# Patient Record
Sex: Female | Born: 1955
Health system: Southern US, Community
[De-identification: ages and names within clinical notes are randomized; demographics above are authoritative.]

## PROBLEM LIST (undated history)

## (undated) ENCOUNTER — Emergency Department: Discharge status: Absent without leave | Payer: Medicare HMO

## (undated) DIAGNOSIS — M25519 Pain in unspecified shoulder: Secondary | ICD-10-CM

## (undated) DIAGNOSIS — M549 Dorsalgia, unspecified: Secondary | ICD-10-CM

## (undated) HISTORY — PX: ABDOMINAL HYSTERECTOMY: SHX81

## (undated) HISTORY — DX: Pain in unspecified shoulder: M25.519

## (undated) HISTORY — DX: Dorsalgia, unspecified: M54.9

---

## 2005-07-17 ENCOUNTER — Other Ambulatory Visit: Payer: Self-pay

## 2005-07-17 ENCOUNTER — Emergency Department: Payer: Self-pay | Admitting: Emergency Medicine

## 2005-07-21 ENCOUNTER — Ambulatory Visit: Payer: Self-pay

## 2006-01-28 ENCOUNTER — Ambulatory Visit: Payer: Self-pay | Admitting: Gastroenterology

## 2008-04-30 ENCOUNTER — Emergency Department: Payer: Self-pay | Admitting: Emergency Medicine

## 2008-05-08 ENCOUNTER — Emergency Department: Payer: Self-pay | Admitting: Emergency Medicine

## 2008-05-08 ENCOUNTER — Other Ambulatory Visit: Payer: Self-pay

## 2009-03-01 ENCOUNTER — Encounter
Admission: RE | Admit: 2009-03-01 | Discharge: 2009-03-01 | Payer: Self-pay | Admitting: Physical Medicine and Rehabilitation

## 2010-07-27 ENCOUNTER — Ambulatory Visit: Payer: Self-pay | Admitting: Family Medicine

## 2012-07-10 ENCOUNTER — Emergency Department: Payer: Self-pay | Admitting: Emergency Medicine

## 2012-07-10 LAB — COMPREHENSIVE METABOLIC PANEL
Albumin: 4.1 g/dL (ref 3.4–5.0)
Alkaline Phosphatase: 59 U/L (ref 50–136)
Calcium, Total: 9.1 mg/dL (ref 8.5–10.1)
Co2: 28 mmol/L (ref 21–32)
Creatinine: 0.79 mg/dL (ref 0.60–1.30)
EGFR (Non-African Amer.): >60
Glucose: 142 mg/dL — ABNORMAL HIGH (ref 65–99)
Osmolality: 283 (ref 275–301)
SGPT (ALT): 25 U/L
Sodium: 142 mmol/L (ref 136–145)

## 2012-07-10 LAB — URINALYSIS, COMPLETE
Bilirubin,UR: NEGATIVE
Blood: NEGATIVE
Ketone: NEGATIVE
Ph: 5 (ref 4.5–8.0)
Protein: NEGATIVE
Specific Gravity: 1.003 (ref 1.003–1.030)
Transitional Epi: <1

## 2012-07-10 LAB — CBC
HCT: 36.5 % (ref 35.0–47.0)
MCHC: 33.6 g/dL (ref 32.0–36.0)
MCV: 99 fL (ref 80–100)
RDW: 12.2 % (ref 11.5–14.5)
WBC: 4.6 10*3/uL (ref 3.6–11.0)

## 2012-07-13 ENCOUNTER — Emergency Department: Payer: Self-pay | Admitting: *Deleted

## 2012-07-13 LAB — URINALYSIS, COMPLETE
Bilirubin,UR: NEGATIVE
Blood: NEGATIVE
Glucose,UR: NEGATIVE mg/dL (ref 0–75)
Ketone: NEGATIVE
Nitrite: NEGATIVE
Ph: 7 (ref 4.5–8.0)
Protein: NEGATIVE
RBC,UR: 1 /HPF (ref 0–5)
Specific Gravity: 1.005 (ref 1.003–1.030)
WBC UR: 4 /HPF (ref 0–5)

## 2012-07-13 LAB — COMPREHENSIVE METABOLIC PANEL
BUN: 6 mg/dL — ABNORMAL LOW (ref 7–18)
Bilirubin,Total: 0.3 mg/dL (ref 0.2–1.0)
Creatinine: 0.67 mg/dL (ref 0.60–1.30)
Glucose: 98 mg/dL (ref 65–99)
Osmolality: 281 (ref 275–301)
Potassium: 3.8 mmol/L (ref 3.5–5.1)
Sodium: 142 mmol/L (ref 136–145)
Total Protein: 7.7 g/dL (ref 6.4–8.2)

## 2012-07-13 LAB — CBC
MCH: 33.1 pg (ref 26.0–34.0)
MCHC: 33.6 g/dL (ref 32.0–36.0)
MCV: 99 fL (ref 80–100)

## 2013-02-12 ENCOUNTER — Ambulatory Visit: Payer: Self-pay | Admitting: Family Medicine

## 2014-03-02 LAB — CBC
HCT: 35 % (ref 35.0–47.0)
HGB: 11.6 g/dL — ABNORMAL LOW (ref 12.0–16.0)
MCH: 31.9 pg (ref 26.0–34.0)
MCHC: 33 g/dL (ref 32.0–36.0)
MCV: 97 fL (ref 80–100)
Platelet: 261 10*3/uL (ref 150–440)
RBC: 3.63 10*6/uL — ABNORMAL LOW (ref 3.80–5.20)
RDW: 13.1 % (ref 11.5–14.5)
WBC: 6.7 10*3/uL (ref 3.6–11.0)

## 2014-03-02 LAB — URINALYSIS, COMPLETE
BACTERIA: NONE SEEN
BLOOD: NEGATIVE
Bilirubin,UR: NEGATIVE
Glucose,UR: NEGATIVE mg/dL (ref 0–75)
Ketone: NEGATIVE
Nitrite: NEGATIVE
PH: 6 (ref 4.5–8.0)
Protein: NEGATIVE
Specific Gravity: 1.019 (ref 1.003–1.030)
Squamous Epithelial: 1
WBC UR: 7 /HPF (ref 0–5)

## 2014-03-02 LAB — TROPONIN I: Troponin-I: <0.02 ng/mL

## 2014-03-02 LAB — DRUG SCREEN, URINE
AMPHETAMINES, UR SCREEN: NEGATIVE (ref ?–1000)
BARBITURATES, UR SCREEN: NEGATIVE (ref ?–200)
Benzodiazepine, Ur Scrn: POSITIVE (ref ?–200)
CANNABINOID 50 NG, UR ~~LOC~~: NEGATIVE (ref ?–50)
COCAINE METABOLITE, UR ~~LOC~~: NEGATIVE (ref ?–300)
MDMA (ECSTASY) UR SCREEN: NEGATIVE (ref ?–500)
METHADONE, UR SCREEN: NEGATIVE (ref ?–300)
Opiate, Ur Screen: POSITIVE (ref ?–300)
Phencyclidine (PCP) Ur S: NEGATIVE (ref ?–25)
TRICYCLIC, UR SCREEN: POSITIVE (ref ?–1000)

## 2014-03-02 LAB — BASIC METABOLIC PANEL
Anion Gap: 6 — ABNORMAL LOW (ref 7–16)
BUN: 16 mg/dL (ref 7–18)
CALCIUM: 9.1 mg/dL (ref 8.5–10.1)
CHLORIDE: 105 mmol/L (ref 98–107)
CREATININE: 0.93 mg/dL (ref 0.60–1.30)
Co2: 28 mmol/L (ref 21–32)
Glucose: 116 mg/dL — ABNORMAL HIGH (ref 65–99)
Osmolality: 280 (ref 275–301)
Potassium: 3.2 mmol/L — ABNORMAL LOW (ref 3.5–5.1)
Sodium: 139 mmol/L (ref 136–145)

## 2014-03-02 LAB — SALICYLATE LEVEL

## 2014-03-02 LAB — ACETAMINOPHEN LEVEL: Acetaminophen: <2 ug/mL

## 2014-03-02 LAB — TSH: Thyroid Stimulating Horm: 0.24 u[IU]/mL — ABNORMAL LOW

## 2014-03-02 LAB — T4, FREE: Free Thyroxine: 0.99 ng/dL (ref 0.76–1.46)

## 2014-03-02 LAB — ETHANOL: Ethanol: <3 mg/dL

## 2014-03-04 LAB — COMPREHENSIVE METABOLIC PANEL
ALBUMIN: 4.3 g/dL (ref 3.4–5.0)
ALK PHOS: 72 U/L
ALT: 28 U/L (ref 12–78)
ANION GAP: 10 (ref 7–16)
AST: 19 U/L (ref 15–37)
BUN: 14 mg/dL (ref 7–18)
Bilirubin,Total: 0.4 mg/dL (ref 0.2–1.0)
CALCIUM: 9.6 mg/dL (ref 8.5–10.1)
Chloride: 105 mmol/L (ref 98–107)
Co2: 25 mmol/L (ref 21–32)
Creatinine: 0.78 mg/dL (ref 0.60–1.30)
EGFR (African American): >60
Glucose: 147 mg/dL — ABNORMAL HIGH (ref 65–99)
OSMOLALITY: 283 (ref 275–301)
POTASSIUM: 3.7 mmol/L (ref 3.5–5.1)
SODIUM: 140 mmol/L (ref 136–145)
Total Protein: 8.4 g/dL — ABNORMAL HIGH (ref 6.4–8.2)

## 2014-03-04 LAB — CBC
HCT: 41.7 % (ref 35.0–47.0)
HGB: 14.2 g/dL (ref 12.0–16.0)
MCH: 32.6 pg (ref 26.0–34.0)
MCHC: 34 g/dL (ref 32.0–36.0)
MCV: 96 fL (ref 80–100)
PLATELETS: 321 10*3/uL (ref 150–440)
RBC: 4.36 10*6/uL (ref 3.80–5.20)
RDW: 13.2 % (ref 11.5–14.5)
WBC: 14.5 10*3/uL — AB (ref 3.6–11.0)

## 2014-03-04 LAB — ACETAMINOPHEN LEVEL: Acetaminophen: <2 ug/mL

## 2014-03-04 LAB — CK TOTAL AND CKMB (NOT AT ARMC)
CK, Total: 68 U/L
CK-MB: 1.6 ng/mL (ref 0.5–3.6)

## 2014-03-04 LAB — SALICYLATE LEVEL: Salicylates, Serum: 1.9 mg/dL

## 2014-03-04 LAB — TROPONIN I: Troponin-I: <0.02 ng/mL

## 2014-03-04 LAB — LIPASE, BLOOD: Lipase: 94 U/L (ref 73–393)

## 2014-03-05 ENCOUNTER — Inpatient Hospital Stay: Payer: Self-pay | Admitting: Psychiatry

## 2014-03-22 DIAGNOSIS — M5134 Other intervertebral disc degeneration, thoracic region: Secondary | ICD-10-CM | POA: Insufficient documentation

## 2014-04-04 DIAGNOSIS — R419 Unspecified symptoms and signs involving cognitive functions and awareness: Secondary | ICD-10-CM | POA: Insufficient documentation

## 2015-04-12 NOTE — Consult Note (Signed)
PATIENT NAME:  Janet Murphy, Yareth F MR#:  [id number] DATE OF BIRTH:  April 15, 1956  DATE OF CONSULTATION:  03/03/2014    CONSULTING PHYSICIAN:  Insiya Oshea K. Guss Bundehalla, MD  PLACE OF DICTATION: [facility] Emergency Room - BHU 3, [place], [place].  AGE: 59 years. SEX: Female.  RACE: White.  SUBJECTIVE: The patient was seen in consultation in room BHU-3 in the Emergency Room at [facility].  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 The patient is married for 27 years and lives with her husband who is 60 years old, and has stage 2 of COPD. The patient is brought to the Emergency Room after she had a panic attack. The patient reports that she has degenerative disk disease with chronic pain for which she is being followed at pain management clinic [place], [place], by Dr. Regenia Skeeterogra, and in addition she is being followed by Dr. Lacie ScottsNiemeyer, who is primary care physician. The patient reports that recently pain management clinic doctors reduced her from Tylenol with hydrocodone to hydrocodone alone, and she has been demanding Tylenol back and they would not give it.  Her pain has got worse. In addition, she has been taking Xanax 1 mg 4 times a day from Dr. Carron BrazenNiemeyer's office. The patient reports that she has had 2 panic attacks because of her husband's bad health and having stage 2 COPD. She was brought to the Emergency Room for the same.   OBJECTIVE: The patient was seen laying in bed wearing hospital clothes, alert, but really confused. She said this was some behavioral health place and they merged with [facility], and she could not name the new hospital. She said this was March and did not know the date.  She asked "what is the date". She stated "this is a nice place with a good fellow like him," and she pointed to the officer.  She said this was 2016, really confused and she is not able to focus. Memory and recall are poor. Denies hearing voices,  seeing things. Denies paranoid suspiciousness.  Denies any ideas or plans to hurt herself or others, and repeatedly stated that she came for a panic/anxiety attack. Insight and judgement guarded versus impaired with confusion at this time.   IMPRESSION:  1.  Cognitive disorder secondary to Xanax abuse-dependence to be ruled out.  2.  Mood disorder secondary to chronic pain in the back.   PLAN: Observe the patient.  Probably she is withdrawing from Xanax overdose.  We will continue to observe her at this time and do p.r.n. medications only as needed. Tomorrow, that is Monday, 03/04/2014, Mr. Casandra DoffingKen Smith is to call her pain management doctor and Dr. Carron BrazenNiemeyer's office to find out further details about her, along with her husband, who may know better than her about what was going on, and then plan accordingly and plan for discharge.  ____________________________ Jannet MantisSurya K. Guss Bundehalla, MD skc:sg D: 03/03/2014 18:19:20 ET T: 03/04/2014 06:59:13 ET JOB#: [id number]  cc: Monika SalkSurya K. Guss Bundehalla, MD, <Dictator> Beau FannySURYA K Hannan Tetzlaff MD ELECTRONICALLY SIGNED 03/05/2014 20:40

## 2015-04-12 NOTE — H&P (Signed)
PATIENT NAME:  Janet Murphy, Janet Murphy MR#:  [id number] DATE OF BIRTH:  10/25/1956  DATE OF ADMISSION:  03/05/2014  REFERRING PHYSICIAN: Emergency Room MD.   ATTENDING PHYSICIAN: Jolanta B. Jennet MaduroPucilowska, MD  IDENTIFYING DATA: Janet Murphy is a 59 year old female with history of depression and anxiety.   CHIEF COMPLAINT: "I did not get my Prozac."   HISTORY OF PRESENT ILLNESS: Janet Murphy is rather disorganized and difficult to interview. She is a poor historian. She reports that on the day of admission to the Emergency Room, which was on March 14th, she had a panic attack. She felt that her husband was unkind, rushing her to cook supper while he was watching a basketball game. She became rather upset, and in order to handle her mean husband, she took more than usually of hydrocodone and Xanax. She took 25 mg of hydrocodone and 2 mg of Xanax. She is taking 20 mg of hydrocodone 3 to 4 times a day usually, and Xanax 4 times a day. She does not believe that she overdosed on her medication. She adamantly denies that this was a suicide attempt and explains in great excruciating detail that she is [place] and would never do such thing. She is meant to help people and take care of everybody. The husband felt that the wife overdosed on medications and brought her to the Emergency Room. In the Emergency Room, she had several episodes of symptoms that were physical in nature and was discovered to be short of breath, had UTI and severe diarrhea. She, for several days, was taken care of by Emergency Room physicians. When she was medically cleared, she was admitted to psychiatry by Dr. Garnetta BuddyFaheem. The patient denies any symptoms of depression, anxiety or psychosis. She denies suicidal thoughts or a suicide attempt. She denies misuse of her prescription pills. She does not drink or use illicit drugs. She denies ever having an overdose or an episode when she was not in control of her behavior. Her husband has been calling  Emergency Room staff repeatedly, complaining about his wife behaving strangely. When I call him, he did not wish to talk to me and preferred to speak with someone who can speak [place]. My social worker is not available at the moment until later this afternoon. Therefore, I am unable to obtain any information from the husband. On the unit, the patient presents as hyperactive, hyperverbal, disorganized, intrusive, confused, unable to remember anything. She repeatedly asked for Allegra, repeatedly asked for Prozac, which she was given. Has no boundaries and has emotional incontinence. She is difficult to redirect. She insists on getting her Prozac. She never mentioned pain, even though she has not been given any of her narcotics. She sees Dr. Lacie ScottsNiemeyer on a regular basis, who directed her to a pain clinic in [place], where Dr. Regenia Skeeterogra prescribes hydrocodone. Her dose was recently increased from 10 to 20 mg 3 to 4 times a day as needed for pain. Dr. Lacie ScottsNiemeyer prescribed Xanax 1 mg 4 times daily, but recommended that the patient see a psychiatrist at [facility] in [place]. The patient did not make appointment yet.   PAST PSYCHIATRIC HISTORY: She has never seen a psychiatrist. She has been taking Prozac 20 mg for depression and Xanax 4 mg for anxiety. She reports having 2 panic attacks in her life, the first one a year ago in her sleep and another one on the day of admission to Emergency Room. She never attempted suicide. She never had any problems with substances or  prescription pills.   FAMILY PSYCHIATRIC HISTORY: None reported.   PAST MEDICAL HISTORY: Chronic pain from osteoarthritis, COPD.   ALLERGIES: No known drug allergies.   MEDICATIONS ON ADMISSION: I do not believe that this is a full or good list. Celebrex 200 mg twice daily, Hysingla ER 20 mg at 4:00 p.m., Prozac 20 mg daily, trazodone 50 mg at bedtime, Xanax 1 mg 4 times daily, ProAir as needed for shortness of breath, methocarbamol 500 mg  3 times daily, omega-3 fatty acids 1000 mg daily, Myrbetriq 25 mg daily, calcium daily.   SOCIAL HISTORY: She is married. She has been on and off with her husband for 16 years. She lives with her 30 year old son who was just saved in 2003. She works full time as a Chartered certified accountant, Furniture conservator/restorer." She is involved in her church and tries to help people around her as much as she can.   REVIEW OF SYSTEMS:  CONSTITUTIONAL: No fevers or chills. No weight changes.  EYES: No double or blurred vision.  ENT: No hearing loss.  RESPIRATORY: No shortness of breath or cough.  CARDIOVASCULAR: No chest pain or orthopnea.  GASTROINTESTINAL: No abdominal pain, nausea, vomiting or diarrhea.  GENITOURINARY: No incontinence or frequency.  ENDOCRINE: No heat or cold intolerance.  LYMPHATIC: No anemia or easy bruising.  INTEGUMENTARY: No acne or rash.  MUSCULOSKELETAL: Positive for back pain radiating to her left foot.  NEUROLOGIC: No tingling or weakness.  PSYCHIATRIC: See history of present illness for details.   PHYSICAL EXAMINATION:  VITAL SIGNS: Blood pressure 105/74, pulse 77, respirations 20, temperature 98.3.  GENERAL: This is a slender middle-aged female in no acute distress.  HEENT: The pupils are equal, round and reactive to light. Sclerae anicteric.  NECK: Supple. No thyromegaly.  LUNGS: Clear to auscultation. No dullness to percussion.  HEART: Regular rhythm and rate. No murmurs, rubs or gallops.  ABDOMEN: Soft, nontender, nondistended. Positive bowel sounds.  MUSCULOSKELETAL: Normal muscle strength in all extremities.  SKIN: No rashes or bruises.  LYMPHATIC: No cervical adenopathy.  NEUROLOGIC: Cranial nerves II through XII are intact.   LABORATORY DATA: Chemistries are within normal limits. Blood alcohol level 0. LFTs within normal limits. Cardiac enzymes negative. TSH 0.24 with normal thyroxine 0.99. Urine tox screen positive for benzodiazepines, opiates and tricyclic antidepressants. CBC  within normal limits with white blood count of 14.5. Urinalysis is suggestive of urinary tract infection. Serum acetaminophen and salicylates are low. Her pH on admission was 7.6 with pCO2 of 19 and pO2 of 127. EKG: Normal sinus rhythm, septal infarct cited before March 2015, abnormal EKG.   MENTAL STATUS EXAMINATION ON ADMISSION: The patient is alert and oriented to person, place, time and somewhat to situation. She gives me a different story than the husband reported in the chart, but she does not remember all of it. She is pleasant, polite and cooperative but very intrusive, a little difficult to redirect. She is well-groomed, casually dressed. She maintains good eye contact. Her speech is pressured and rapid. Her mood is good, with exuberant affect. Thought process is logical with its own logic. The patient has racing thoughts and oftentimes goes on a tangent. She is preoccupied religiously. She is slightly irritable also, does not like to be interrupted. She denies auditory or visual hallucinations. Her cognition is grossly intact. She registers 3 out of 3 and recalls 2 out of 3 objects after 5 minutes. She can spell "world." She seems of normal intelligence with general average fund of knowledge. Her  insight and judgment are limited.   SUICIDE RISK ASSESSMENT ON ADMISSION: This is a patient with history of depression and anxiety, who overdosed on narcotic painkillers and benzodiazepines, most likely accidentally, who was severely sedated on admission to the Emergency Room, but now seems manic, exuberant, disorganized and talkative. She is a good [place] and denies thoughts of suicide.   INITIAL DIAGNOSIS:  AXIS I: Mood disorder, not otherwise specified, rule out bipolar disorder mania with psychosis, benzodiazepine dependence, opioid dependence, panic disorder without agoraphobia.  AXIS II: Deferred.  AXIS III: Chronic obstructive pulmonary disease, osteoarthritis, urinary tract infection, status  post benzodiazepine and opioid overdose.  AXIS IV: Mental illness, marital conflict.  AXIS V: Global assessment of functioning 25.   PLAN: The patient was admitted to [facility] for safety, stabilization and medication management. She was initially placed on suicide precautions and was closely monitored for any unsafe behaviors. She underwent full psychiatric and risk assessment. She received pharmacotherapy, individual and group psychotherapy, substance abuse counseling and support from therapeutic milieu.   1. Suicidal ideation: The patient adamantly denies.  2. Mood: She insists to take Prozac. She was restarted on Prozac by Dr. Garnetta Buddy. I believe that she needs a mood stabilizer and quick. I was unable to talk to the husband to discuss her symptoms. Will have social worker to call them.  3. Anxiety: She is on Xanax. We cut her dose to 3 mg a day. Will try to maybe switch it to a different benzodiazepine. We will use it for now as an additional mood stabilizer.  4. Pain: She is not in pain. No opioids were prescribed.  5. Medical: She is on inhaler.  6. UTI: She is on Septra.  7. Social: Contact with the family necessary.  8. Disposition: She will be discharged to home.   ____________________________ Ellin Goodie. Jennet Maduro, MD jbp:lb D: 03/06/2014 11:51:16 ET T: 03/06/2014 12:47:20 ET JOB#: [id number]  cc: Jolanta B. Jennet Maduro, MD, <Dictator> Shari Prows MD ELECTRONICALLY SIGNED 03/13/2014 7:30

## 2015-04-12 NOTE — Consult Note (Signed)
Details:   - Subjective   Pt seen for follow up. She is a 59 yo married female brought by her husband, due to concerns about over dose  on her pain meds. She stated that she has been using Xanax due to Panic attacks, prescribed by Dr Glenis SmokerNeimeyer. She is prescribed 1mg  four times daily. She is also getting pain meds from Dr Regenia Skeeterogra in [place]. Her husband about her abuse of pain meds and Xanax and he tried to call the mobile crisis last week as well but they not did bring her to the hospital. She stated that she was started on a pain medication,Hysingla by Dr Regenia Skeeterogra. She stated that her husband gets agitated when "its the full moon". When I asked her about the same, she stated that she can tell and know it by the way he acts. he has flushed all her medications in the toilet as he was concerned about her abuse of xanax and pain pills. She reported that she feels depressed, anxious and has pain attacks. Has relationship issues with her husband as well.   - Objective  Pt is thinly built female sitting in chair. She maintained fair eye contact. Her speech was low in tone and volume Mood was depressed and anxious. Affect  was congruent. Thought process was logical and goal directed. Thought content was non delusional. She has poor insight and judgment about her use of pills. Her language was intact and fund of knowledge seems appropriate. She denied SI/HI or plans.   - Diagnosis:  MDD, recurrent , severe Sedative Hypnotic anxiolytic Dependence Opioid Dependence   Plan;  Pt will be admitted to inpt behavioral health unit for stabilization and safety.  She will continue on Xanax 1mg  po q6hrs.  She will continue on Prozac 20mg  po qdaily.  Will continue to monitor.    Thank you for allowing me to participate in care of this pt.   Electronic Signatures: Rhunette CroftFaheem, Zain Bingman S (MD)  (Signed 17-Mar-15 11:41)  Authored: Details   Last Updated: 17-Mar-15 11:41 by Rhunette CroftFaheem, Virat Prather S (MD)

## 2017-02-16 DIAGNOSIS — M48061 Spinal stenosis, lumbar region without neurogenic claudication: Secondary | ICD-10-CM | POA: Diagnosis not present

## 2017-02-16 DIAGNOSIS — M545 Low back pain: Secondary | ICD-10-CM | POA: Diagnosis not present

## 2017-02-16 DIAGNOSIS — Z79891 Long term (current) use of opiate analgesic: Secondary | ICD-10-CM | POA: Diagnosis not present

## 2017-02-16 DIAGNOSIS — G894 Chronic pain syndrome: Secondary | ICD-10-CM | POA: Diagnosis not present

## 2017-05-25 ENCOUNTER — Other Ambulatory Visit: Payer: Self-pay

## 2017-05-27 ENCOUNTER — Ambulatory Visit: Payer: Self-pay | Admitting: Family Medicine

## 2017-06-15 ENCOUNTER — Encounter: Payer: Self-pay | Admitting: Family Medicine

## 2017-06-15 ENCOUNTER — Ambulatory Visit (INDEPENDENT_AMBULATORY_CARE_PROVIDER_SITE_OTHER): Payer: Medicare HMO | Admitting: Family Medicine

## 2017-06-15 VITALS — BP 98/78 | HR 78 | Resp 16 | Ht 67.0 in | Wt 108.0 lb

## 2017-06-15 DIAGNOSIS — M5134 Other intervertebral disc degeneration, thoracic region: Secondary | ICD-10-CM | POA: Diagnosis not present

## 2017-06-15 DIAGNOSIS — G4700 Insomnia, unspecified: Secondary | ICD-10-CM

## 2017-06-15 DIAGNOSIS — R419 Unspecified symptoms and signs involving cognitive functions and awareness: Secondary | ICD-10-CM | POA: Diagnosis not present

## 2017-06-15 DIAGNOSIS — M5136 Other intervertebral disc degeneration, lumbar region: Secondary | ICD-10-CM

## 2017-06-15 DIAGNOSIS — R636 Underweight: Secondary | ICD-10-CM

## 2017-06-15 DIAGNOSIS — E559 Vitamin D deficiency, unspecified: Secondary | ICD-10-CM | POA: Diagnosis not present

## 2017-06-15 DIAGNOSIS — M5126 Other intervertebral disc displacement, lumbar region: Secondary | ICD-10-CM

## 2017-06-15 NOTE — Progress Notes (Signed)
Date:  06/15/2017   Name:  Janet Murphy   DOB:  10-Dec-1956   MRN:  [id number]  PCP:  Schuyler Amor, MD    Chief Complaint: Establish Care (Needs referral for [facility] for Degenerative Disc and Bulging Disk )   History of Present Illness:  This is a 61 y.o. female seen for initial visit. Needs referral to [facility] but PCP no longer takes her insurance. Hx major neurocognitive disorder dx'd 2015 following hospitalization at [facility] for delirium felt due to benzo/opoid use and UTI. MOCA improved from 14/30 to 24/30 with treatment. Was on Prozac, Busapr, and Aricept in past but off now. Reports thoracic DDD and lumbar disc bulge, takes hydrocodone tid. Hx vit D def, just restarted supp last week. Takes melatonin for insomnia but doesn't help, both initial and terminal. Father died MI 42, mother died stroke 28, sister died lymphoma 62, sister died lung ca 64. Tet, pneumo, zoster status unknown, did get flu shot last year. Colonoscopy 7-8 yrs ago showed polyps, last mammo 7-8 yrs ago ok, no Pap since menopause.  Review of Systems:  Review of Systems  Constitutional: Negative for chills, fever and unexpected weight change.  HENT: Negative for ear pain, sinus pain and trouble swallowing.   Eyes: Negative for pain.  Respiratory: Negative for cough and shortness of breath.   Cardiovascular: Negative for chest pain, palpitations and leg swelling.  Gastrointestinal: Negative for abdominal pain, constipation and diarrhea.  Genitourinary: Negative for difficulty urinating, flank pain and vaginal bleeding.  Musculoskeletal: Negative for joint swelling.  Neurological: Negative for tremors, syncope and light-headedness.  Hematological: Negative for adenopathy.    Patient Active Problem List   Diagnosis Date Noted  . Underweight 06/15/2017  . Neurocognitive disorder 04/04/2014  . Degenerative disk disease 03/22/2014    Prior to Admission medications   Medication Sig  Start Date End Date Taking? Authorizing Provider  HYDROcodone-acetaminophen (NORCO) 10-325 MG tablet Take 1 tablet by mouth 3 (three) times daily.   Yes [provider]  Melatonin 5 MG CAPS Take 10 mg by mouth at bedtime.   Yes [provider]  Probiotic Product (PROBIOTIC ADVANCED PO) Take by mouth daily.   Yes [provider]  TURMERIC PO Take by mouth daily.   Yes [provider]    No Known Allergies  Past Surgical History:  Procedure Laterality Date  . ABDOMINAL HYSTERECTOMY     partial     Social History  Substance Use Topics  . Smoking status: Former Smoker    Packs/day: 0.50    Types: Cigarettes  . Smokeless tobacco: Never Used  . Alcohol use No    Family History  Problem Relation Age of Onset  . Arthritis Mother   . Stroke Mother   . Heart attack Father   . Early death Son 23       car accident     Medication list has been reviewed and updated.  Physical Examination: BP 98/78   Pulse 78   Resp 16   Ht 5\' 7"  (1.702 m)   Wt 108 lb (49 kg)   SpO2 96%   BMI 16.92 kg/m   Physical Exam  Constitutional: She is oriented to person, place, and time. She appears well-developed and well-nourished.  HENT:  Head: Normocephalic and atraumatic.  Right Ear: External ear normal.  Left Ear: External ear normal.  Nose: Nose normal.  Mouth/Throat: Oropharynx is clear and moist.  TMs clear  Eyes:  Conjunctivae and EOM are normal. Pupils are equal, round, and reactive to light. No scleral icterus.  Neck: Neck supple. No thyromegaly present.  Cardiovascular: Normal rate, regular rhythm, normal heart sounds and intact distal pulses.   Pulmonary/Chest: Effort normal and breath sounds normal.  Abdominal: Soft. She exhibits no distension and no mass. There is no tenderness.  Musculoskeletal: She exhibits no edema.  Lymphadenopathy:    She has no cervical adenopathy.  Neurological: She is alert and oriented to person, place, and time.  Coordination normal.  Romberg negative, gait normal SLUMS 22/30  Skin: Skin is warm and dry.  Psychiatric: She has a normal mood and affect. Her behavior is normal.  Nursing note and vitals reviewed.   Assessment and Plan:  1. Neurocognitive disorder MCI at present, likely due to multisubstance abuse (head CT 2015 neg) - Comprehensive Metabolic Panel (CMET) - CBC - [id number]  2. Degenerative disc disease, thoracic On hydrocodone tid - Ambulatory referral to Pain Clinic  3. Bulge of lumbar disc without myelopathy On hydrocodone tid - Ambulatory referral to Pain Clinic  4. Insomnia, unspecified type Marginal control on melatonin, avoid rx sleep meds due to MCI  5. Vitamin D deficiency On supplement x 1 week - Vitamin D (25 hydroxy)  6. Underweight - TSH  Return in about 4 weeks (around 07/13/2017).   45 minutes spent with pt over half on counseling  Rojelio Uhrich M. Kingsley SpittlePlonk, Jr. MD [facility]  06/15/2017

## 2017-06-16 ENCOUNTER — Encounter: Payer: Self-pay | Admitting: Family Medicine

## 2017-06-16 ENCOUNTER — Other Ambulatory Visit: Payer: Self-pay | Admitting: Family Medicine

## 2017-06-16 DIAGNOSIS — E538 Deficiency of other specified B group vitamins: Secondary | ICD-10-CM | POA: Insufficient documentation

## 2017-06-16 DIAGNOSIS — E559 Vitamin D deficiency, unspecified: Secondary | ICD-10-CM | POA: Insufficient documentation

## 2017-06-16 LAB — CBC
HEMOGLOBIN: 12.2 g/dL (ref 11.1–15.9)
Hematocrit: 36.1 % (ref 34.0–46.6)
MCH: 32.2 pg (ref 26.6–33.0)
MCHC: 33.8 g/dL (ref 31.5–35.7)
MCV: 95 fL (ref 79–97)
Platelets: 316 10*3/uL (ref 150–379)
RBC: 3.79 x10E6/uL (ref 3.77–5.28)
RDW: 12.8 % (ref 12.3–15.4)
WBC: 7.3 10*3/uL (ref 3.4–10.8)

## 2017-06-16 LAB — COMPREHENSIVE METABOLIC PANEL
ALBUMIN: 4.8 g/dL (ref 3.6–4.8)
ALK PHOS: 58 IU/L (ref 39–117)
ALT: 11 IU/L (ref 0–32)
AST: 16 IU/L (ref 0–40)
Albumin/Globulin Ratio: 1.7 (ref 1.2–2.2)
BUN/Creatinine Ratio: 21 (ref 12–28)
BUN: 16 mg/dL (ref 8–27)
CO2: 22 mmol/L (ref 20–29)
Calcium: 9.4 mg/dL (ref 8.7–10.3)
Chloride: 103 mmol/L (ref 96–106)
Creatinine, Ser: 0.75 mg/dL (ref 0.57–1.00)
GFR calc Af Amer: 99 mL/min/{1.73_m2} (ref >59)
GFR, EST NON AFRICAN AMERICAN: 86 mL/min/{1.73_m2} (ref >59)
GLOBULIN, TOTAL: 2.8 g/dL (ref 1.5–4.5)
GLUCOSE: 78 mg/dL (ref 65–99)
POTASSIUM: 4.3 mmol/L (ref 3.5–5.2)
Sodium: 140 mmol/L (ref 134–144)
TOTAL PROTEIN: 7.6 g/dL (ref 6.0–8.5)

## 2017-06-16 LAB — VITAMIN D 25 HYDROXY (VIT D DEFICIENCY, FRACTURES): VIT D 25 HYDROXY: 25.9 ng/mL — AB (ref 30.0–100.0)

## 2017-06-16 LAB — VITAMIN B12: Vitamin B-12: 232 pg/mL (ref 232–1245)

## 2017-06-16 LAB — TSH: TSH: 1.09 u[IU]/mL (ref 0.450–4.500)

## 2017-06-16 MED ORDER — VITAMIN D3 25 MCG (1000 UT) PO CAPS: 1.0000 | ORAL_CAPSULE | Freq: Every day | ORAL | Status: DC

## 2017-06-16 MED ORDER — VITAMIN B-12 1000 MCG PO TABS: 1000.0000 ug | ORAL_TABLET | Freq: Every day | ORAL | Status: DC

## 2017-06-16 NOTE — Progress Notes (Signed)
Vitamin D and B12 levels low, begin supplements

## 2017-07-01 DIAGNOSIS — G894 Chronic pain syndrome: Secondary | ICD-10-CM | POA: Diagnosis not present

## 2017-07-13 ENCOUNTER — Ambulatory Visit (INDEPENDENT_AMBULATORY_CARE_PROVIDER_SITE_OTHER): Payer: Medicare HMO | Admitting: Family Medicine

## 2017-07-13 ENCOUNTER — Encounter: Payer: Self-pay | Admitting: Family Medicine

## 2017-07-13 VITALS — BP 118/78 | HR 88 | Resp 16 | Ht 67.0 in | Wt 111.0 lb

## 2017-07-13 DIAGNOSIS — E559 Vitamin D deficiency, unspecified: Secondary | ICD-10-CM

## 2017-07-13 DIAGNOSIS — R419 Unspecified symptoms and signs involving cognitive functions and awareness: Secondary | ICD-10-CM

## 2017-07-13 DIAGNOSIS — R636 Underweight: Secondary | ICD-10-CM | POA: Diagnosis not present

## 2017-07-13 DIAGNOSIS — E538 Deficiency of other specified B group vitamins: Secondary | ICD-10-CM

## 2017-07-13 DIAGNOSIS — M5134 Other intervertebral disc degeneration, thoracic region: Secondary | ICD-10-CM

## 2017-07-13 MED ORDER — GABAPENTIN 100 MG PO CAPS: 100.0000 mg | ORAL_CAPSULE | Freq: Every day | ORAL | 3 refills | Status: DC

## 2017-07-13 NOTE — Progress Notes (Signed)
Date:  07/13/2017   Name:  Janet Murphy   DOB:  [birth year]   MRN:  [id number]  PCP:  Schuyler AmorPlonk, Markasia Carrol, MD    Chief Complaint: Pain ([facility]eds [facility] from [facility] [place] to approve one more year of pain clinic: [facility] Reg[facility]ogra [facility]. )   History of Present Illness:  This is a 61 y.o. female seen for one month f/y from initial visit. Reports pain marginally controlled, needs another referral to pain clinic. Not sleeping at night due to pain. Blood work showed vit D and B12 def, on supplements x 3d. No new complaints.  Review of Systems:  Review of Systems  Constitutional: Negative for chills and fever.  Respiratory: Negative for cough and shortness of breath.   Cardiovascular: Negative for chest pain and leg swelling.  Neurological: Negative for syncope and light-headedness.    Patient Active Problem List   Diagnosis Date Noted  . Vitamin D deficiency 06/16/2017  . B12 deficiency 06/16/2017  . Underweight 06/15/2017  . Neurocognitive disorder 04/04/2014  . DDD (degenerative disc disease), thoracic 03/22/2014    Prior to Admission medications   Medication Sig Start Date End Date Taking? Authorizing Provider  Cholecalciferol (VITAMIN D3) 1000 units CAPS Take 1 capsule (1,000 Units total) by mouth daily. 06/16/17  Yes Dmonte Maher, Chrissie NoaWilliam, MD  HYDROcodone-acetaminophen (NORCO) 10-325 MG tablet Take 1 tablet by mouth 3 (three) times daily.   Yes [provider]  Melatonin 5 MG CAPS Take 10 mg by mouth at bedtime.   Yes [provider]  Probiotic Product (PROBIOTIC ADVANCED PO) Take by mouth daily.   Yes [provider]  TURMERIC PO Take by mouth daily.   Yes [provider]  vitamin B-12 (CYANOCOBALAMIN) 1000 MCG tablet Take 1 tablet (1,000 mcg total) by mouth daily. 06/16/17  Yes Vashon Arch, Chrissie NoaWilliam, MD  gabapentin (NEURONTIN) 100 MG capsule Take 1 capsule (100 mg total) by mouth at bedtime. 07/13/17   Schuyler AmorPlonk, Carlee Tesfaye, MD    No Known  Allergies  Past Surgical History:  Procedure Laterality Date  . ABDOMINAL HYSTERECTOMY     partial     Social History  Substance Use Topics  . Smoking status: Former Smoker    Packs/day: 0.50    Types: Cigarettes  . Smokeless tobacco: Never Used  . Alcohol use No    Family History  Problem Relation Age of Onset  . Arthritis Mother   . Stroke Mother   . Heart attack Father   . Early death Son [id number]       car accident     Medication list has been reviewed and updated.  Physical Examination: BP 118/78   Pulse 88   Resp 16   Ht 5\' 7"  (1.702 m)   Wt 111 lb (50.3 kg)   BMI 17.39 kg/m   Physical Exam  Constitutional: She appears well-developed and well-nourished.  Cardiovascular: Normal rate, regular rhythm and normal heart sounds.   Pulmonary/Chest: Effort normal and breath sounds normal.  Musculoskeletal: She exhibits no edema.  Neurological: She is alert.  Skin: Skin is warm and dry.  Psychiatric: She has a normal mood and affect. Her behavior is normal.  Nursing note and vitals reviewed.   Assessment and Plan:  1. DDD (degenerative disc disease), thoracic Cont Norco per pain clinic, add gabapentin qhs to help with sleep, consider Remeron if ineffective, avoid TCAs given MCI  2. Neurocognitive disorder Stable, likely due to multisubstance use  3. Vitamin D deficiency On supp,  level next visit  4. B12 deficiency On supp, level next visit  5. Underweight Weight improved 3#  Return in about 2 months (around 09/13/2017).  Dionne AnoWilliam M. Kingsley SpittlePlonk, Jr. MD [facility]  07/13/2017

## 2017-10-03 ENCOUNTER — Telehealth: Payer: Self-pay

## 2017-10-03 NOTE — Telephone Encounter (Signed)
Faxed ref to [facility]

## 2017-10-07 ENCOUNTER — Other Ambulatory Visit: Payer: Self-pay | Admitting: Family Medicine

## 2017-12-29 DIAGNOSIS — M545 Low back pain: Secondary | ICD-10-CM | POA: Diagnosis not present

## 2017-12-29 DIAGNOSIS — M48061 Spinal stenosis, lumbar region without neurogenic claudication: Secondary | ICD-10-CM | POA: Diagnosis not present

## 2017-12-29 DIAGNOSIS — G894 Chronic pain syndrome: Secondary | ICD-10-CM | POA: Diagnosis not present

## 2017-12-29 DIAGNOSIS — Z79891 Long term (current) use of opiate analgesic: Secondary | ICD-10-CM | POA: Diagnosis not present

## 2018-03-24 DIAGNOSIS — G894 Chronic pain syndrome: Secondary | ICD-10-CM | POA: Diagnosis not present

## 2018-06-27 ENCOUNTER — Other Ambulatory Visit: Payer: Self-pay | Admitting: Family Medicine

## 2018-08-17 ENCOUNTER — Ambulatory Visit (INDEPENDENT_AMBULATORY_CARE_PROVIDER_SITE_OTHER): Payer: Medicare HMO | Admitting: Family Medicine

## 2018-08-17 ENCOUNTER — Encounter: Payer: Self-pay | Admitting: Family Medicine

## 2018-08-17 VITALS — BP 120/76 | HR 64 | Ht 67.0 in | Wt 111.0 lb

## 2018-08-17 DIAGNOSIS — Z23 Encounter for immunization: Secondary | ICD-10-CM | POA: Diagnosis not present

## 2018-08-17 DIAGNOSIS — F5101 Primary insomnia: Secondary | ICD-10-CM

## 2018-08-17 DIAGNOSIS — F17200 Nicotine dependence, unspecified, uncomplicated: Secondary | ICD-10-CM | POA: Diagnosis not present

## 2018-08-17 MED ORDER — GABAPENTIN 100 MG PO CAPS: ORAL_CAPSULE | ORAL | 3 refills | Status: DC

## 2018-08-17 NOTE — Progress Notes (Signed)
Name: Janet Murphy   MRN: [id number]    DOB: 03/23/1956   Date:08/17/2018       Progress Note  Subjective  Chief Complaint  Chief Complaint  Patient presents with  . Insomnia    Insomnia  Primary symptoms: no fragmented sleep, no sleep disturbance, difficulty falling asleep, no somnolence, no frequent awakening, no premature morning awakening, no malaise/fatigue, no napping.   The current episode started more than one month. The problem occurs nightly. The problem has been gradually improving since onset. Past treatments include medication (gabapentin 100 mg). The treatment provided moderate relief. PMH includes: no depression.    No problem-specific Assessment & Plan notes found for this encounter.   Past Medical History:  Diagnosis Date  . Back pain   . Shoulder pain     Past Surgical History:  Procedure Laterality Date  . ABDOMINAL HYSTERECTOMY     partial     Family History  Problem Relation Age of Onset  . Arthritis Mother   . Stroke Mother   . Heart attack Father   . Early death Son 42       car accident     Social History   Socioeconomic History  . Marital status: Married    Spouse name: Not on file  . Number of children: Not on file  . Years of education: Not on file  . Highest education level: Not on file  Occupational History  . Not on file  Social Needs  . Financial resource strain: Not on file  . Food insecurity:    Worry: Not on file    Inability: Not on file  . Transportation needs:    Medical: Not on file    Non-medical: Not on file  Tobacco Use  . Smoking status: Current Some Day Smoker    Packs/day: 0.50    Types: Cigarettes  . Smokeless tobacco: Never Used  Substance and Sexual Activity  . Alcohol use: No  . Drug use: No  . Sexual activity: Yes  Lifestyle  . Physical activity:    Days per week: Not on file    Minutes per session: Not on file  . Stress: Not on file  Relationships  . Social connections:    Talks on phone:  Not on file    Gets together: Not on file    Attends religious service: Not on file    Active member of club or organization: Not on file    Attends meetings of clubs or organizations: Not on file    Relationship status: Not on file  . Intimate partner violence:    Fear of current or ex partner: Not on file    Emotionally abused: Not on file    Physically abused: Not on file    Forced sexual activity: Not on file  Other Topics Concern  . Not on file  Social History Narrative  . Not on file    No Known Allergies  Outpatient Medications Prior to Visit  Medication Sig Dispense Refill  . Apoaequorin (PREVAGEN PO) Take 1 capsule by mouth daily.    . Cholecalciferol (VITAMIN D3) 1000 units CAPS Take 1 capsule (1,000 Units total) by mouth daily. 60 capsule   . HYDROcodone-acetaminophen (NORCO) 10-325 MG tablet Take 1 tablet by mouth 3 (three) times daily. [employer]    . Melatonin 5 MG CAPS Take 10 mg by mouth at bedtime.    . Probiotic Product (PROBIOTIC ADVANCED PO) Take by mouth daily.    Marland Kitchen  vitamin B-12 (CYANOCOBALAMIN) 1000 MCG tablet Take 1 tablet (1,000 mcg total) by mouth daily.    Marland Kitchen. gabapentin (NEURONTIN) 100 MG capsule TAKE 1 CAPSULE BY MOUTH ONCE DAILY AT BEDTIME 90 capsule 3  . TURMERIC PO Take by mouth daily.     No facility-administered medications prior to visit.     Review of Systems  Constitutional: Negative for chills, fever, malaise/fatigue and weight loss.  HENT: Negative for ear discharge, ear pain and sore throat.   Eyes: Negative for blurred vision.  Respiratory: Negative for cough, sputum production, shortness of breath and wheezing.   Cardiovascular: Negative for chest pain, palpitations and leg swelling.  Gastrointestinal: Negative for abdominal pain, blood in stool, constipation, diarrhea, heartburn, melena and nausea.  Genitourinary: Negative for dysuria, frequency, hematuria and urgency.  Musculoskeletal: Negative for back pain, joint pain, myalgias and  neck pain.  Skin: Negative for rash.  Neurological: Negative for dizziness, tingling, sensory change, focal weakness and headaches.  Endo/Heme/Allergies: Negative for environmental allergies and polydipsia. Does not bruise/bleed easily.  Psychiatric/Behavioral: Negative for depression, sleep disturbance and suicidal ideas. The patient has insomnia. The patient is not nervous/anxious.      Objective  Vitals:   08/17/18 1335  BP: 120/76  Pulse: 64  Weight: 111 lb (50.3 kg)  Height: 5\' 7"  (1.702 m)    Physical Exam  Constitutional: She is oriented to person, place, and time. She appears well-developed and well-nourished.  HENT:  Head: Normocephalic.  Right Ear: External ear normal.  Left Ear: External ear normal.  Mouth/Throat: Oropharynx is clear and moist.  Eyes: Pupils are equal, round, and reactive to light. Conjunctivae and EOM are normal. Lids are everted and swept, no foreign bodies found. Left eye exhibits no hordeolum. No foreign body present in the left eye. Right conjunctiva is not injected. Left conjunctiva is not injected. No scleral icterus.  Neck: Normal range of motion. Neck supple. No JVD present. No tracheal deviation present. No thyromegaly present.  Cardiovascular: Normal rate, regular rhythm, normal heart sounds and intact distal pulses. Exam reveals no gallop and no friction rub.  No murmur heard. Pulmonary/Chest: Effort normal and breath sounds normal. No respiratory distress. She has no wheezes. She has no rales.  Abdominal: Soft. Bowel sounds are normal. She exhibits no mass. There is no hepatosplenomegaly. There is no tenderness. There is no rebound and no guarding.  Musculoskeletal: Normal range of motion. She exhibits no edema or tenderness.  Lymphadenopathy:    She has no cervical adenopathy.  Neurological: She is alert and oriented to person, place, and time. She has normal strength. She displays normal reflexes. No cranial nerve deficit.  Skin: Skin is  warm. No rash noted.  Psychiatric: She has a normal mood and affect. Her mood appears not anxious. She does not exhibit a depressed mood.  Nursing note and vitals reviewed.     Assessment & Plan  Problem List Items Addressed This Visit    None    Visit Diagnoses    Primary insomnia    -  Primary   Chronic Controlled Continue Gabapentin 100 mg q hs.    Relevant Medications   gabapentin (NEURONTIN) 100 MG capsule   Tobacco dependence       Smoking cessation discussed.   Influenza vaccine needed       influenza vaccine administered   Relevant Orders   Flu Vaccine QUAD 6+ mos PF IM (Fluarix Quad PF) (Completed)      Meds ordered this encounter  Medications  . gabapentin (NEURONTIN) 100 MG capsule    Sig: TAKE 1 CAPSULE BY MOUTH ONCE DAILY AT BEDTIME    Dispense:  90 capsule    Refill:  3      Dr. Hayden [facility]  08/17/18

## 2018-09-11 DIAGNOSIS — G894 Chronic pain syndrome: Secondary | ICD-10-CM | POA: Diagnosis not present

## 2018-11-29 ENCOUNTER — Encounter: Payer: Medicare HMO | Admitting: Family Medicine

## 2019-02-02 ENCOUNTER — Telehealth: Payer: Self-pay

## 2019-02-02 NOTE — Telephone Encounter (Signed)
Pt appears on [facility] Quality Report for [facility] but has never been seen in the office.  I called the pt and left a message asking him/her to call me at [phone] to confirm her PCP.  VDM (DD)

## 2019-02-19 DIAGNOSIS — G894 Chronic pain syndrome: Secondary | ICD-10-CM | POA: Diagnosis not present

## 2019-05-21 ENCOUNTER — Other Ambulatory Visit: Payer: Self-pay | Admitting: Family Medicine

## 2019-05-21 DIAGNOSIS — F5101 Primary insomnia: Secondary | ICD-10-CM

## 2019-07-09 ENCOUNTER — Telehealth: Payer: Self-pay | Admitting: Family Medicine

## 2019-07-09 ENCOUNTER — Telehealth: Payer: Self-pay

## 2019-07-09 NOTE — Telephone Encounter (Signed)
Pt walked in with her sister stating that she had not been able to eat in 3 days and vomitting. After speaking to her and her sister, it has been "weeks" of this going on. She appeared to not be comprehending well, therefore I explained to her sister she needed to go  to ER where she can get some IV fluids and labs drawn. The sister voiced understanding.

## 2019-07-09 NOTE — Telephone Encounter (Signed)
Pt walked in wanting to be seen she did not have an appointment. She has not had anything to eat in days, is unable to keep any food down she has been vomiting. She also mention getting really hot, she did not have a fever (97.4), was advised to go in to ER or UC since there was a possibility she may be dehydrated and may need IV and we are unable to do that at our office. Pt was ale to talk to the [place].

## 2019-08-08 ENCOUNTER — Other Ambulatory Visit: Payer: Self-pay | Admitting: Family Medicine

## 2019-08-08 DIAGNOSIS — F5101 Primary insomnia: Secondary | ICD-10-CM

## 2019-08-30 DIAGNOSIS — G894 Chronic pain syndrome: Secondary | ICD-10-CM | POA: Diagnosis not present

## 2019-10-08 ENCOUNTER — Emergency Department: Payer: Medicare HMO

## 2019-10-08 ENCOUNTER — Emergency Department
Admission: EM | Admit: 2019-10-08 | Discharge: 2019-10-08 | Disposition: A | Discharge status: Home / Self Care | Payer: Medicare HMO | Attending: Emergency Medicine | Admitting: Emergency Medicine

## 2019-10-08 ENCOUNTER — Other Ambulatory Visit: Payer: Self-pay

## 2019-10-08 ENCOUNTER — Encounter: Payer: Self-pay | Admitting: Intensive Care

## 2019-10-08 DIAGNOSIS — K5903 Drug induced constipation: Secondary | ICD-10-CM | POA: Diagnosis not present

## 2019-10-08 DIAGNOSIS — F17210 Nicotine dependence, cigarettes, uncomplicated: Secondary | ICD-10-CM | POA: Insufficient documentation

## 2019-10-08 DIAGNOSIS — Z79899 Other long term (current) drug therapy: Secondary | ICD-10-CM | POA: Insufficient documentation

## 2019-10-08 DIAGNOSIS — K5900 Constipation, unspecified: Secondary | ICD-10-CM | POA: Diagnosis not present

## 2019-10-08 DIAGNOSIS — R140 Abdominal distension (gaseous): Secondary | ICD-10-CM | POA: Insufficient documentation

## 2019-10-08 DIAGNOSIS — R109 Unspecified abdominal pain: Secondary | ICD-10-CM | POA: Diagnosis not present

## 2019-10-08 LAB — COMPREHENSIVE METABOLIC PANEL
ALT: 176 U/L — ABNORMAL HIGH (ref 0–44)
AST: 25 U/L (ref 15–41)
Albumin: 3.7 g/dL (ref 3.5–5.0)
Alkaline Phosphatase: 83 U/L (ref 38–126)
Anion gap: 9 (ref 5–15)
BUN: 9 mg/dL (ref 8–23)
CO2: 24 mmol/L (ref 22–32)
Calcium: 8.9 mg/dL (ref 8.9–10.3)
Chloride: 103 mmol/L (ref 98–111)
Creatinine, Ser: 0.6 mg/dL (ref 0.44–1.00)
GFR calc Af Amer: >60 mL/min (ref >60)
GFR calc non Af Amer: >60 mL/min (ref >60)
Glucose, Bld: 121 mg/dL — ABNORMAL HIGH (ref 70–99)
Potassium: 3 mmol/L — ABNORMAL LOW (ref 3.5–5.1)
Sodium: 136 mmol/L (ref 135–145)
Total Bilirubin: 0.8 mg/dL (ref 0.3–1.2)
Total Protein: 6.8 g/dL (ref 6.5–8.1)

## 2019-10-08 LAB — CBC
HCT: 34.3 % — ABNORMAL LOW (ref 36.0–46.0)
Hemoglobin: 11.7 g/dL — ABNORMAL LOW (ref 12.0–15.0)
MCH: 34.6 pg — ABNORMAL HIGH (ref 26.0–34.0)
MCHC: 34.1 g/dL (ref 30.0–36.0)
MCV: 101.5 fL — ABNORMAL HIGH (ref 80.0–100.0)
Platelets: 346 10*3/uL (ref 150–400)
RBC: 3.38 MIL/uL — ABNORMAL LOW (ref 3.87–5.11)
RDW: 13.6 % (ref 11.5–15.5)
WBC: 6.8 10*3/uL (ref 4.0–10.5)
nRBC: 0 % (ref 0.0–0.2)

## 2019-10-08 LAB — LIPASE, BLOOD: Lipase: 12 U/L (ref 11–51)

## 2019-10-08 MED ORDER — BISACODYL 10 MG RE SUPP: 10.0000 mg | RECTAL | 0 refills | Status: DC | PRN

## 2019-10-08 NOTE — ED Notes (Signed)
Pt alert and oriented X 4, stable for discharge. RR even and unlabored, color WNL. Discussed discharge instructions and follow up when appropriate. Instructed to follow up with ER for any life threatening symptoms or concerns that patient or family of patient may have  

## 2019-10-08 NOTE — ED Triage Notes (Signed)
Patient c/o abd pain and constipation. Reports last BM 1 1/2 weeks ago. Tried some OTC meds at home with no relief. Has chronic back pain and sees pain management. Reports she thinks constipation is from her pain meds

## 2019-10-08 NOTE — ED Notes (Signed)
Pt c/o generalized abdominal pain x 3 months. States that she thinks she has a bowel blockage but has put off being seen due to Covid. Pt states last BM 2 weeks ago but "it was small". Pt states she feels like it is hard for her to swallow anything due to being so full. Pt A&O x 4, NAD noted at this time. Respirations even and unlabored.

## 2019-10-08 NOTE — ED Provider Notes (Signed)
[facility] Emergency Department Provider Note   ____________________________________________    I have reviewed the triage vital signs and the nursing notes.   HISTORY  Chief Complaint Abdominal Pain and Constipation     HPI Janet Murphy is a 63 y.o. female who presents with complaints of constipation and a bloating sensation.  Patient describes she does not think that she has had a bowel movement in a week and a half.  She feels bloated and would like something for constipation.  Denies nausea or vomiting.  Has had an abdominal hysterectomy.  Has not taken anything for this.  Currently reports she is feeling well  Past Medical History:  Diagnosis Date   Back pain    Shoulder pain     Patient Active Problem List   Diagnosis Date Noted   Vitamin D deficiency 06/16/2017   B12 deficiency 06/16/2017   Underweight 06/15/2017   Neurocognitive disorder 04/04/2014   DDD (degenerative disc disease), thoracic 03/22/2014    Past Surgical History:  Procedure Laterality Date   ABDOMINAL HYSTERECTOMY     partial     Prior to Admission medications   Medication Sig Start Date End Date Taking? Authorizing Provider  Apoaequorin (PREVAGEN PO) Take 1 capsule by mouth daily.    [provider]  bisacodyl (DULCOLAX) 10 MG suppository Place 1 suppository (10 mg total) rectally as needed for moderate constipation. 10/08/19   Jene Every, MD  Cholecalciferol (VITAMIN D3) 1000 units CAPS Take 1 capsule (1,000 Units total) by mouth daily. 06/16/17   Plonk, Chrissie Noa, MD  gabapentin (NEURONTIN) 100 MG capsule TAKE 1 CAPSULE BY MOUTH AT BEDTIME 08/08/19   Duanne Limerick, MD  HYDROcodone-acetaminophen (NORCO) 10-325 MG tablet Take 1 tablet by mouth 3 (three) times daily. [employer] IT consultant, Historical, MD  Melatonin 5 MG CAPS Take 10 mg by mouth at bedtime.    [provider]  Probiotic Product (PROBIOTIC ADVANCED PO) Take by mouth  daily.    [provider]  vitamin B-12 (CYANOCOBALAMIN) 1000 MCG tablet Take 1 tablet (1,000 mcg total) by mouth daily. 06/16/17   Schuyler Amor, MD     Allergies Patient has no known allergies.  Family History  Problem Relation Age of Onset   Arthritis Mother    Stroke Mother    Heart attack Father    Early death Son 57       car accident     Social History Social History   Tobacco Use   Smoking status: Current Every Day Smoker    Packs/day: 0.50    Types: Cigarettes   Smokeless tobacco: Never Used  Substance Use Topics   Alcohol use: No   Drug use: No    Review of Systems  Constitutional: No fever/chills Eyes: No visual changes.  ENT: No sore throat. Cardiovascular: Denies chest pain. Respiratory: Denies shortness of breath. Gastrointestinal: As above Genitourinary: Negative for dysuria. Musculoskeletal: Negative for back pain. Skin: Negative for rash. Neurological: Negative for headache   ____________________________________________   PHYSICAL EXAM:  VITAL SIGNS: ED Triage Vitals  Enc Vitals Group     BP 10/08/19 1132 (!) 164/87     Pulse Rate 10/08/19 1132 70     Resp 10/08/19 1132 14     Temp 10/08/19 1132 98.2 F (36.8 C)     Temp Source 10/08/19 1132 Oral     SpO2 10/08/19 1132 95 %     Weight 10/08/19 1133 49.4 kg (  109 lb)     Height 10/08/19 1133 1.702 m (5\' 7" )     Head Circumference --      Peak Flow --      Pain Score 10/08/19 1133 7     Pain Loc --      Pain Edu? --      Excl. in [place]? --     Constitutional: Alert and oriented.    Mouth/Throat: Mucous membranes are moist.    Cardiovascular: Normal rate, regular rhythm.  Good peripheral circulation. Respiratory: Normal respiratory effort.  No retractions. Lungs CTAB. Gastrointestinal: Soft and nontender. No distention.  No CVA tenderness.  Reassuring exam Genitourinary: deferred Musculoskeletal:   Warm and well perfused Neurologic:  Normal speech and language.  No gross focal neurologic deficits are appreciated.  Skin:  Skin is warm, dry and intact. No rash noted. Psychiatric: Mood and affect are normal. Speech and behavior are normal.  ____________________________________________   LABS (all labs ordered are listed, but only abnormal results are displayed)  Labs Reviewed  COMPREHENSIVE METABOLIC PANEL - Abnormal; Notable for the following components:      Result Value   Potassium 3.0 (*)    Glucose, Bld 121 (*)    ALT 176 (*)    All other components within normal limits  CBC - Abnormal; Notable for the following components:   RBC 3.38 (*)    Hemoglobin 11.7 (*)    HCT 34.3 (*)    MCV 101.5 (*)    MCH 34.6 (*)    All other components within normal limits  LIPASE, BLOOD   ____________________________________________  EKG   ____________________________________________  RADIOLOGY  KUB unremarkable ____________________________________________   PROCEDURES  Procedure(s) performed: No  Procedures   Critical Care performed: No ____________________________________________   INITIAL IMPRESSION / ASSESSMENT AND PLAN / ED COURSE  Pertinent labs & imaging results that were available during my care of the patient were reviewed by me and considered in my medical decision making (see chart for details).  Patient complains of constipation, abdominal exam overall is quite reassuring, lab work is unremarkable.  KUB is normal, no abnormal bowel gas patterns, no significant constipation.  Discussed with patient options for treatment of constipation and improving bowel movements, recommended follow-up with PCP.   ____________________________________________   FINAL CLINICAL IMPRESSION(S) / ED DIAGNOSES  Final diagnoses:  Drug-induced constipation        Note:  This document was prepared using Dragon voice recognition software and may include unintentional dictation errors.   Lavonia Drafts, MD 10/08/19 2036

## 2019-10-08 NOTE — Discharge Instructions (Signed)
Magnesium citrate as needed for constipation

## 2020-01-25 DIAGNOSIS — G894 Chronic pain syndrome: Secondary | ICD-10-CM | POA: Diagnosis not present

## 2020-05-20 ENCOUNTER — Emergency Department: Payer: Medicare HMO

## 2020-05-20 ENCOUNTER — Emergency Department
Admission: EM | Admit: 2020-05-20 | Discharge: 2020-05-20 | Disposition: A | Discharge status: Home / Self Care | Payer: Medicare HMO | Attending: Emergency Medicine | Admitting: Emergency Medicine

## 2020-05-20 ENCOUNTER — Other Ambulatory Visit: Payer: Self-pay

## 2020-05-20 DIAGNOSIS — Z79891 Long term (current) use of opiate analgesic: Secondary | ICD-10-CM | POA: Insufficient documentation

## 2020-05-20 DIAGNOSIS — Z20822 Contact with and (suspected) exposure to covid-19: Secondary | ICD-10-CM | POA: Diagnosis not present

## 2020-05-20 DIAGNOSIS — K5900 Constipation, unspecified: Secondary | ICD-10-CM | POA: Diagnosis not present

## 2020-05-20 DIAGNOSIS — F17210 Nicotine dependence, cigarettes, uncomplicated: Secondary | ICD-10-CM | POA: Diagnosis not present

## 2020-05-20 DIAGNOSIS — R109 Unspecified abdominal pain: Secondary | ICD-10-CM

## 2020-05-20 DIAGNOSIS — R1032 Left lower quadrant pain: Secondary | ICD-10-CM | POA: Insufficient documentation

## 2020-05-20 DIAGNOSIS — Z03818 Encounter for observation for suspected exposure to other biological agents ruled out: Secondary | ICD-10-CM | POA: Diagnosis not present

## 2020-05-20 DIAGNOSIS — M5134 Other intervertebral disc degeneration, thoracic region: Secondary | ICD-10-CM | POA: Insufficient documentation

## 2020-05-20 DIAGNOSIS — R9431 Abnormal electrocardiogram [ECG] [EKG]: Secondary | ICD-10-CM | POA: Diagnosis not present

## 2020-05-20 DIAGNOSIS — R1084 Generalized abdominal pain: Secondary | ICD-10-CM | POA: Diagnosis not present

## 2020-05-20 LAB — CBC
HCT: 36 % (ref 36.0–46.0)
Hemoglobin: 12 g/dL (ref 12.0–15.0)
MCH: 35.9 pg — ABNORMAL HIGH (ref 26.0–34.0)
MCHC: 33.3 g/dL (ref 30.0–36.0)
MCV: 107.8 fL — ABNORMAL HIGH (ref 80.0–100.0)
Platelets: 317 10*3/uL (ref 150–400)
RBC: 3.34 MIL/uL — ABNORMAL LOW (ref 3.87–5.11)
RDW: 11.9 % (ref 11.5–15.5)
WBC: 6.7 10*3/uL (ref 4.0–10.5)
nRBC: 0 % (ref 0.0–0.2)

## 2020-05-20 LAB — COMPREHENSIVE METABOLIC PANEL
ALT: 17 U/L (ref 0–44)
AST: 22 U/L (ref 15–41)
Albumin: 4.1 g/dL (ref 3.5–5.0)
Alkaline Phosphatase: 63 U/L (ref 38–126)
Anion gap: 9 (ref 5–15)
BUN: 12 mg/dL (ref 8–23)
CO2: 27 mmol/L (ref 22–32)
Calcium: 9.1 mg/dL (ref 8.9–10.3)
Chloride: 100 mmol/L (ref 98–111)
Creatinine, Ser: 0.76 mg/dL (ref 0.44–1.00)
GFR calc Af Amer: >60 mL/min (ref >60)
GFR calc non Af Amer: >60 mL/min (ref >60)
Glucose, Bld: 81 mg/dL (ref 70–99)
Potassium: 4.5 mmol/L (ref 3.5–5.1)
Sodium: 136 mmol/L (ref 135–145)
Total Bilirubin: 0.5 mg/dL (ref 0.3–1.2)
Total Protein: 7.1 g/dL (ref 6.5–8.1)

## 2020-05-20 LAB — URINALYSIS, COMPLETE (UACMP) WITH MICROSCOPIC
Bilirubin Urine: NEGATIVE
Glucose, UA: NEGATIVE mg/dL
Hgb urine dipstick: NEGATIVE
Ketones, ur: NEGATIVE mg/dL
Leukocytes,Ua: NEGATIVE
Nitrite: NEGATIVE
Protein, ur: NEGATIVE mg/dL
Specific Gravity, Urine: 1.01 (ref 1.005–1.030)
pH: 5 (ref 5.0–8.0)

## 2020-05-20 LAB — SARS CORONAVIRUS 2 BY RT PCR (HOSPITAL ORDER, PERFORMED IN ~~LOC~~ HOSPITAL LAB): SARS Coronavirus 2: NEGATIVE

## 2020-05-20 LAB — LIPASE, BLOOD: Lipase: 18 U/L (ref 11–51)

## 2020-05-20 MED ORDER — LINACLOTIDE 72 MCG PO CAPS: 72.0000 ug | ORAL_CAPSULE | Freq: Every day | ORAL | 0 refills | Status: DC

## 2020-05-20 MED ORDER — SODIUM CHLORIDE 0.9% FLUSH: 3.0000 mL | Freq: Once | INTRAVENOUS | Status: DC

## 2020-05-20 MED ORDER — DOCUSATE SODIUM 100 MG PO CAPS: 100.0000 mg | ORAL_CAPSULE | Freq: Two times a day (BID) | ORAL | 2 refills | Status: AC

## 2020-05-20 MED ORDER — POLYETHYLENE GLYCOL 3350 17 GM/SCOOP PO POWD: 17.0000 g | Freq: Every day | ORAL | 0 refills | Status: DC | PRN

## 2020-05-20 NOTE — ED Notes (Signed)
E-signature not working at this time. Pt verbalized understanding of D/C instructions, prescriptions and follow up care with no further questions at this time. Pt in NAD and ambulatory at time of D/C. Husband with patient at time of discharge also verbalized understanding of D/C instructions and follow up care.

## 2020-05-20 NOTE — ED Triage Notes (Signed)
First nurse note- here for loss taste and smell and feeling shaky.  Pt in wheel chair. Got out of wheel chair and pushed in across room then sat back down in it. NAD

## 2020-05-20 NOTE — ED Provider Notes (Signed)
[facility] Emergency Department Provider Note  Time seen: 1:11 PM  I have reviewed the triage vital signs and the nursing notes.   HISTORY  Chief Complaint Abdominal Pain and Constipation   HPI Janet Murphy is a 64 y.o. female with a past medical history of back pain, shoulder pain, cognitive disorder, presents to the emergency department for abdominal discomfort and constipation.  According to the patient and her husband this has been an ongoing issue for many years as the patient is on chronic opioids.  They state however over the past month or so her bowel movements have decreased significantly.  Denies any vomiting.  Patient states some abdominal discomfort in the lower abdomen.  No fever.  Largely negative review of systems per patient and husband.   Past Medical History:  Diagnosis Date   Back pain    Shoulder pain     Patient Active Problem List   Diagnosis Date Noted   Vitamin D deficiency 06/16/2017   B12 deficiency 06/16/2017   Underweight 06/15/2017   Neurocognitive disorder 04/04/2014   DDD (degenerative disc disease), thoracic 03/22/2014    Past Surgical History:  Procedure Laterality Date   ABDOMINAL HYSTERECTOMY     partial     Prior to Admission medications   Medication Sig Start Date End Date Taking? Authorizing Provider  Apoaequorin (PREVAGEN PO) Take 1 capsule by mouth daily.    [provider]  bisacodyl (DULCOLAX) 10 MG suppository Place 1 suppository (10 mg total) rectally as needed for moderate constipation. 10/08/19   Jene Every, MD  Cholecalciferol (VITAMIN D3) 1000 units CAPS Take 1 capsule (1,000 Units total) by mouth daily. 06/16/17   Plonk, Chrissie Noa, MD  gabapentin (NEURONTIN) 100 MG capsule TAKE 1 CAPSULE BY MOUTH AT BEDTIME 08/08/19   Janet Limerick, MD  HYDROcodone-acetaminophen (NORCO) 10-325 MG tablet Take 1 tablet by mouth 3 (three) times daily. [place] IT consultant, Historical, MD   Melatonin 5 MG CAPS Take 10 mg by mouth at bedtime.    [provider]  Probiotic Product (PROBIOTIC ADVANCED PO) Take by mouth daily.    [provider]  vitamin B-12 (CYANOCOBALAMIN) 1000 MCG tablet Take 1 tablet (1,000 mcg total) by mouth daily. 06/16/17   Plonk, Chrissie Noa, MD    No Known Allergies  Family History  Problem Relation Age of Onset   Arthritis Mother    Stroke Mother    Heart attack Father    Early death Son 43       car accident     Social History Social History   Tobacco Use   Smoking status: Current Every Day Smoker    Packs/day: 0.50    Types: Cigarettes   Smokeless tobacco: Never Used  Substance Use Topics   Alcohol use: No   Drug use: No    Review of Systems Constitutional: Negative for fever. Cardiovascular: Negative for chest pain. Respiratory: Negative for shortness of breath. Gastrointestinal: Abdominal discomfort at times.  Positive for constipation.  Negative for vomiting. Musculoskeletal: Negative for musculoskeletal complaints Neurological: Negative for headache All other ROS negative  ____________________________________________   PHYSICAL EXAM:  VITAL SIGNS: ED Triage Vitals [05/20/20 0857]  Enc Vitals Group     BP (!) 158/73     Pulse Rate 83     Resp 18     Temp 98.7 F (37.1 C)     Temp Source Oral     SpO2 100 %  Weight 102 lb (46.3 kg)     Height 5\' 7"  (1.702 m)     Head Circumference      Peak Flow      Pain Score 9     Pain Loc      Pain Edu?      Excl. in [place]?    Constitutional: Alert and oriented. Well appearing and in no distress. Eyes: Normal exam ENT      Head: Normocephalic and atraumatic.      Mouth/Throat: Mucous membranes are moist. Cardiovascular: Normal rate, regular rhythm.  Respiratory: Normal respiratory effort without tachypnea nor retractions. Breath sounds are clear  Gastrointestinal: Soft, mild left lower quadrant tenderness without rebound guarding or  distention. Musculoskeletal: Nontender with normal range of motion in all extremities.  Neurologic:  Normal speech and language. No gross focal neurologic deficits  Skin:  Skin is warm, dry and intact.  Psychiatric: Mood and affect are normal.   ____________________________________________    EKG  EKG viewed and interpreted by myself shows a normal sinus rhythm 80 bpm with a narrow QRS, normal axis, normal intervals, no concerning ST changes.  ____________________________________________    RADIOLOGY  X-ray shows large stool burden.  CT shows significant stool burden.  No obstruction.   ____________________________________________   INITIAL IMPRESSION / ASSESSMENT AND PLAN / ED COURSE  Pertinent labs & imaging results that were available during my care of the patient were reviewed by me and considered in my medical decision making (see chart for details).   Patient presents emergency department for constipation intermittent left lower quadrant abdominal discomfort.  Husband states this has been longstanding for months or years but worse over the past 1 month.  Denies any nausea vomiting or diarrhea.  Very slight left lower quadrant tenderness on my exam.  CT scan confirms constipation.  Given these findings I suggested an enema to see if we can loosen up the patient's distal colonic stool burden.  Patient adamantly refusing enema.  Patient is on chronic opioid medication a medication such as Linzess could be helpful for the patient.  We will prescribe for the patient we will also prescribe MiraLAX and Colace to be used until the patient has several good bowel movements.  Patient and husband agreeable to plan of care.  I discussed return precautions for any worsening abdominal pain, development of fever.  [name] was evaluated in Emergency Department on 05/20/2020 for the symptoms described in the history of present illness. She was evaluated in the context of the global  COVID-19 pandemic, which necessitated consideration that the patient might be at risk for infection with the SARS-CoV-2 virus that causes COVID-19. Institutional protocols and algorithms that pertain to the evaluation of patients at risk for COVID-19 are in a state of rapid change based on information released by regulatory bodies including the CDC and federal and state organizations. These policies and algorithms were followed during the patient's care in the ED.  ____________________________________________   FINAL CLINICAL IMPRESSION(S) / ED DIAGNOSES  Constipation Abdominal pain   Harvest Dark, MD 05/20/20 1432

## 2020-05-20 NOTE — ED Triage Notes (Signed)
Pt c/o generalized abd pain with constipation, states her last BM was 2 weeks ago, states she has been taking OTC meds/laxatives with no relief. Denies vomiting.

## 2020-07-11 ENCOUNTER — Emergency Department: Payer: Medicare HMO

## 2020-07-11 ENCOUNTER — Emergency Department
Admission: EM | Admit: 2020-07-11 | Discharge: 2020-07-11 | Disposition: A | Discharge status: Home / Self Care | Payer: Medicare HMO | Attending: Student in an Organized Health Care Education/Training Program | Admitting: Student in an Organized Health Care Education/Training Program

## 2020-07-11 ENCOUNTER — Other Ambulatory Visit: Payer: Self-pay

## 2020-07-11 ENCOUNTER — Encounter: Payer: Self-pay | Admitting: Emergency Medicine

## 2020-07-11 DIAGNOSIS — R079 Chest pain, unspecified: Secondary | ICD-10-CM | POA: Diagnosis not present

## 2020-07-11 DIAGNOSIS — F17210 Nicotine dependence, cigarettes, uncomplicated: Secondary | ICD-10-CM | POA: Insufficient documentation

## 2020-07-11 DIAGNOSIS — R112 Nausea with vomiting, unspecified: Secondary | ICD-10-CM | POA: Diagnosis not present

## 2020-07-11 DIAGNOSIS — R1110 Vomiting, unspecified: Secondary | ICD-10-CM | POA: Diagnosis present

## 2020-07-11 DIAGNOSIS — R0789 Other chest pain: Secondary | ICD-10-CM | POA: Diagnosis not present

## 2020-07-11 DIAGNOSIS — S2231XA Fracture of one rib, right side, initial encounter for closed fracture: Secondary | ICD-10-CM | POA: Diagnosis not present

## 2020-07-11 LAB — CBC WITH DIFFERENTIAL/PLATELET
Abs Immature Granulocytes: 0.07 10*3/uL (ref 0.00–0.07)
Basophils Absolute: 0.1 10*3/uL (ref 0.0–0.1)
Basophils Relative: 1 %
Eosinophils Absolute: 0.2 10*3/uL (ref 0.0–0.5)
Eosinophils Relative: 1 %
HCT: 37.6 % (ref 36.0–46.0)
Hemoglobin: 12.7 g/dL (ref 12.0–15.0)
Immature Granulocytes: 1 %
Lymphocytes Relative: 9 %
Lymphs Abs: 1.1 10*3/uL (ref 0.7–4.0)
MCH: 35.6 pg — ABNORMAL HIGH (ref 26.0–34.0)
MCHC: 33.8 g/dL (ref 30.0–36.0)
MCV: 105.3 fL — ABNORMAL HIGH (ref 80.0–100.0)
Monocytes Absolute: 0.3 10*3/uL (ref 0.1–1.0)
Monocytes Relative: 3 %
Neutro Abs: 10.2 10*3/uL — ABNORMAL HIGH (ref 1.7–7.7)
Neutrophils Relative %: 85 %
Platelets: 447 10*3/uL — ABNORMAL HIGH (ref 150–400)
RBC: 3.57 MIL/uL — ABNORMAL LOW (ref 3.87–5.11)
RDW: 14.7 % (ref 11.5–15.5)
WBC: 11.9 10*3/uL — ABNORMAL HIGH (ref 4.0–10.5)
nRBC: 0 % (ref 0.0–0.2)

## 2020-07-11 LAB — COMPREHENSIVE METABOLIC PANEL
ALT: 95 U/L — ABNORMAL HIGH (ref 0–44)
AST: 62 U/L — ABNORMAL HIGH (ref 15–41)
Albumin: 4.4 g/dL (ref 3.5–5.0)
Alkaline Phosphatase: 109 U/L (ref 38–126)
Anion gap: 12 (ref 5–15)
BUN: 16 mg/dL (ref 8–23)
CO2: 27 mmol/L (ref 22–32)
Calcium: 9.7 mg/dL (ref 8.9–10.3)
Chloride: 93 mmol/L — ABNORMAL LOW (ref 98–111)
Creatinine, Ser: 0.63 mg/dL (ref 0.44–1.00)
GFR calc Af Amer: >60 mL/min (ref >60)
GFR calc non Af Amer: >60 mL/min (ref >60)
Glucose, Bld: 110 mg/dL — ABNORMAL HIGH (ref 70–99)
Potassium: 4.4 mmol/L (ref 3.5–5.1)
Sodium: 132 mmol/L — ABNORMAL LOW (ref 135–145)
Total Bilirubin: 0.9 mg/dL (ref 0.3–1.2)
Total Protein: 8.1 g/dL (ref 6.5–8.1)

## 2020-07-11 LAB — TROPONIN I (HIGH SENSITIVITY)
Troponin I (High Sensitivity): 8 ng/L (ref <18)
Troponin I (High Sensitivity): 7 ng/L (ref <18)

## 2020-07-11 LAB — LIPASE, BLOOD: Lipase: 20 U/L (ref 11–51)

## 2020-07-11 MED ORDER — METOCLOPRAMIDE HCL 5 MG/ML IJ SOLN
10.0000 mg | Freq: Once | INTRAMUSCULAR | Status: AC
  Administered 2020-07-11: 10 mg via INTRAVENOUS
  Filled 2020-07-11: qty 2

## 2020-07-11 MED ORDER — SODIUM CHLORIDE 0.9 % IV BOLUS
500.0000 mL | Freq: Once | INTRAVENOUS | Status: AC
  Administered 2020-07-11: 500 mL via INTRAVENOUS

## 2020-07-11 NOTE — ED Triage Notes (Addendum)
Here for intermittent vomiting. Has long history with constipation r/t medication.  Also having back pain but reports has DDD.  Pt states "I am having heart pain too".  C/o fatigue.  VSS at this time.  Unlabored. Pt is poor historian. Last bowel movement 2 days ago.

## 2020-07-11 NOTE — ED Provider Notes (Signed)
Procedures     ----------------------------------------- 9:20 PM on 07/11/2020 -----------------------------------------  Assumed care from Dr. Roxan Hockey pending CT scans.  Patient states that she passed a large amount of gas and now feels much better, wishes to refuse the CT scan and be discharged.  She is ambulatory, denies any symptoms currently, nontoxic with a strong voice.  Her husband at bedside is in agreement and wants to take her home.  Repeat abdominal exam is benign, nontender, no distention, no peritoneal signs.  It appears that her presentation was due to excessive intestinal gas which is now resolved.    Sharman Cheek, MD 07/11/20 2121

## 2020-07-11 NOTE — Discharge Instructions (Addendum)
Simethicone may be helpful for gas pain symptoms.

## 2020-07-11 NOTE — ED Notes (Signed)
Pt's husband to desk, states pt has taken 120 10 mg hydrocodone tablets over the last 48 hrs, husband reports that patient has cognitive impairment as well

## 2020-07-11 NOTE — ED Provider Notes (Signed)
[facility] Emergency Department Provider Note    First MD Initiated Contact with Patient 07/11/20 1738     (approximate)  I have reviewed the triage vital signs and the nursing notes.   HISTORY  Chief Complaint Emesis  Level v Caveat:  Poor historian - neurocognitive disorder  HPI Janet Murphy is a 65 y.o. female presents to the ER for evaluation of chest pain shortness of breath as well as generalized abdominal pain nausea and vomiting. Does have some pain when taking deep inspiration. Feels like she is having worsening shortness of breath. States the last time she moved her bowels was 3 days ago. Denies any fevers or chills. No history of heart troubles.    Past Medical History:  Diagnosis Date  . Back pain   . Shoulder pain    Family History  Problem Relation Age of Onset  . Arthritis Mother   . Stroke Mother   . Heart attack Father   . Early death Son 45       car accident    Past Surgical History:  Procedure Laterality Date  . ABDOMINAL HYSTERECTOMY     partial    Patient Active Problem List   Diagnosis Date Noted  . Vitamin D deficiency 06/16/2017  . B12 deficiency 06/16/2017  . Underweight 06/15/2017  . Neurocognitive disorder 04/04/2014  . DDD (degenerative disc disease), thoracic 03/22/2014      Prior to Admission medications   Medication Sig Start Date End Date Taking? Authorizing Provider  Apoaequorin (PREVAGEN PO) Take 1 capsule by mouth daily.    [provider]  bisacodyl (DULCOLAX) 10 MG suppository Place 1 suppository (10 mg total) rectally as needed for moderate constipation. 10/08/19   Jene Every, MD  Cholecalciferol (VITAMIN D3) 1000 units CAPS Take 1 capsule (1,000 Units total) by mouth daily. 06/16/17   Plonk, Chrissie Noa, MD  docusate sodium (COLACE) 100 MG capsule Take 1 capsule (100 mg total) by mouth 2 (two) times daily. 05/20/20 05/20/21  Minna Antis, MD  gabapentin (NEURONTIN) 100 MG capsule  TAKE 1 CAPSULE BY MOUTH AT BEDTIME 08/08/19   Duanne Limerick, MD  HYDROcodone-acetaminophen (NORCO) 10-325 MG tablet Take 1 tablet by mouth 3 (three) times daily. [employer] IT consultant, Historical, MD  linaclotide Karlene Einstein) 72 MCG capsule Take 1 capsule (72 mcg total) by mouth daily before breakfast. 05/20/20 06/19/20  Minna Antis, MD  Melatonin 5 MG CAPS Take 10 mg by mouth at bedtime.    [provider]  polyethylene glycol powder (GLYCOLAX/MIRALAX) 17 GM/SCOOP powder Take 17 g by mouth daily as needed for moderate constipation. 05/20/20   Minna Antis, MD  Probiotic Product (PROBIOTIC ADVANCED PO) Take by mouth daily.    [provider]  vitamin B-12 (CYANOCOBALAMIN) 1000 MCG tablet Take 1 tablet (1,000 mcg total) by mouth daily. 06/16/17   Schuyler Amor, MD    Allergies Patient has no known allergies.    Social History Social History   Tobacco Use  . Smoking status: Current Every Day Smoker    Packs/day: 0.50    Types: Cigarettes  . Smokeless tobacco: Never Used  Vaping Use  . Vaping Use: Never used  Substance Use Topics  . Alcohol use: No  . Drug use: No    Review of Systems Patient denies headaches, rhinorrhea, blurry vision, numbness, shortness of breath, chest pain, edema, cough, abdominal pain, nausea, vomiting, diarrhea, dysuria, fevers, rashes or hallucinations unless otherwise stated above in HPI.  ____________________________________________   PHYSICAL EXAM:  VITAL SIGNS: Vitals:   07/11/20 1914 07/11/20 1915  BP: (!) 190/75   Pulse: 75   Resp:  14  Temp:    SpO2: 99%     Constitutional: Alert, in NAD  Eyes: Conjunctivae are normal.  Head: Atraumatic. Nose: No congestion/rhinnorhea. Mouth/Throat: Mucous membranes are moist.   Neck: No stridor. Painless ROM.  Cardiovascular: Normal rate, regular rhythm. Grossly normal heart sounds.  Good peripheral circulation. Respiratory: Normal respiratory effort.  No retractions. Lungs  CTAB. Gastrointestinal: Soft and nontender. No distention. No abdominal bruits. No CVA tenderness. Genitourinary:  Musculoskeletal: No lower extremity tenderness nor edema.  No joint effusions. Neurologic:  Normal speech and language. No gross focal neurologic deficits are appreciated. No facial droop Skin:  Skin is warm, dry and intact. No rash noted. Psychiatric: Mood and affect are normal. Speech and behavior are normal.  ____________________________________________   LABS (all labs ordered are listed, but only abnormal results are displayed)  Results for orders placed or performed during the hospital encounter of 07/11/20 (from the past 24 hour(s))  Troponin I (High Sensitivity)     Status: None   Collection Time: 07/11/20 11:54 AM  Result Value Ref Range   Troponin I (High Sensitivity) 8 <18 ng/L  CBC with Differential     Status: Abnormal   Collection Time: 07/11/20 11:54 AM  Result Value Ref Range   WBC 11.9 (H) 4.0 - 10.5 K/uL   RBC 3.57 (L) 3.87 - 5.11 MIL/uL   Hemoglobin 12.7 12.0 - 15.0 g/dL   HCT 47.6 36 - 46 %   MCV 105.3 (H) 80.0 - 100.0 fL   MCH 35.6 (H) 26.0 - 34.0 pg   MCHC 33.8 30.0 - 36.0 g/dL   RDW 54.6 50.3 - 54.6 %   Platelets 447 (H) 150 - 400 K/uL   nRBC 0.0 0.0 - 0.2 %   Neutrophils Relative % 85 %   Neutro Abs 10.2 (H) 1.7 - 7.7 K/uL   Lymphocytes Relative 9 %   Lymphs Abs 1.1 0.7 - 4.0 K/uL   Monocytes Relative 3 %   Monocytes Absolute 0.3 0 - 1 K/uL   Eosinophils Relative 1 %   Eosinophils Absolute 0.2 0 - 0 K/uL   Basophils Relative 1 %   Basophils Absolute 0.1 0 - 0 K/uL   Immature Granulocytes 1 %   Abs Immature Granulocytes 0.07 0.00 - 0.07 K/uL  Comprehensive metabolic panel     Status: Abnormal   Collection Time: 07/11/20 11:54 AM  Result Value Ref Range   Sodium 132 (L) 135 - 145 mmol/L   Potassium 4.4 3.5 - 5.1 mmol/L   Chloride 93 (L) 98 - 111 mmol/L   CO2 27 22 - 32 mmol/L   Glucose, Bld 110 (H) 70 - 99 mg/dL   BUN 16 8 - 23  mg/dL   Creatinine, Ser 5.68 0.44 - 1.00 mg/dL   Calcium 9.7 8.9 - 12.7 mg/dL   Total Protein 8.1 6.5 - 8.1 g/dL   Albumin 4.4 3.5 - 5.0 g/dL   AST 62 (H) 15 - 41 U/L   ALT 95 (H) 0 - 44 U/L   Alkaline Phosphatase 109 38 - 126 U/L   Total Bilirubin 0.9 0.3 - 1.2 mg/dL   GFR calc non Af Amer >60 >60 mL/min   GFR calc Af Amer >60 >60 mL/min   Anion gap 12 5 - 15  Lipase, blood     Status: None  Collection Time: 07/11/20 11:54 AM  Result Value Ref Range   Lipase 20 11 - 51 U/L  Troponin I (High Sensitivity)     Status: None   Collection Time: 07/11/20  3:20 PM  Result Value Ref Range   Troponin I (High Sensitivity) 7 <18 ng/L   ____________________________________________  EKG My review and personal interpretation at Time: 11:34   Indication: chest pain  Rate: 80  Rhythm: sinus Axis: normal  Other: normal intervals, no stemi ____________________________________________  RADIOLOGY  I personally reviewed all radiographic images ordered to evaluate for the above acute complaints and reviewed radiology reports and findings.  These findings were personally discussed with the patient.  Please see medical record for radiology report.  ____________________________________________   PROCEDURES  Procedure(s) performed:  Procedures    Critical Care performed: no ____________________________________________   INITIAL IMPRESSION / ASSESSMENT AND PLAN / ED COURSE  Pertinent labs & imaging results that were available during my care of the patient were reviewed by me and considered in my medical decision making (see chart for details).   DDX: acs, pe, enteritis, gastritis, pna, pancreatitis, biliary path, constipation  Zanyah Lentsch is a 64 y.o. who presents to the ED with chest pain shortness of breath abdominal pain as described above.  Patient very poor historian.  Her cardiac enzymes are negative.  No signs of ischemia on EKG.  Chest x-ray without acute abnormality does  have mild leukocytosis.  Given constellation of symptoms with pleuritic chest pain poor historian will order CT imaging to further evaluate.  Patient will be signed out to oncoming physician pending CT imaging.  If CTs are negative appropriate for outpatient follow-up.     The patient was evaluated in Emergency Department today for the symptoms described in the history of present illness. He/she was evaluated in the context of the global COVID-19 pandemic, which necessitated consideration that the patient might be at risk for infection with the SARS-CoV-2 virus that causes COVID-19. Institutional protocols and algorithms that pertain to the evaluation of patients at risk for COVID-19 are in a state of rapid change based on information released by regulatory bodies including the CDC and federal and state organizations. These policies and algorithms were followed during the patient's care in the ED.  As part of my medical decision making, I reviewed the following data within the electronic MEDICAL RECORD NUMBER Nursing notes reviewed and incorporated, Labs reviewed, notes from prior ED visits and [place] Controlled Substance Database   ____________________________________________   FINAL CLINICAL IMPRESSION(S) / ED DIAGNOSES  Final diagnoses:  Chest pain, unspecified type  Nausea and vomiting, intractability of vomiting not specified, unspecified vomiting type      NEW MEDICATIONS STARTED DURING THIS VISIT:  New Prescriptions   No medications on file     Note:  This document was prepared using Dragon voice recognition software and may include unintentional dictation errors.    Willy Eddy, MD 07/11/20 2023

## 2020-07-11 NOTE — ED Notes (Signed)
Pt took all leads and monitoring devices off, states passed gas and feels better and wants to go home. Husband now at bedside, MD notified.

## 2020-07-15 DIAGNOSIS — E041 Nontoxic single thyroid nodule: Secondary | ICD-10-CM | POA: Diagnosis not present

## 2020-07-15 DIAGNOSIS — G4451 Hemicrania continua: Secondary | ICD-10-CM | POA: Diagnosis not present

## 2020-07-15 DIAGNOSIS — H93239 Hyperacusis, unspecified ear: Secondary | ICD-10-CM | POA: Diagnosis not present

## 2020-07-15 DIAGNOSIS — R519 Headache, unspecified: Secondary | ICD-10-CM | POA: Diagnosis not present

## 2020-07-15 DIAGNOSIS — H9223 Otorrhagia, bilateral: Secondary | ICD-10-CM | POA: Diagnosis not present

## 2020-07-15 DIAGNOSIS — H9319 Tinnitus, unspecified ear: Secondary | ICD-10-CM | POA: Diagnosis not present

## 2020-07-15 DIAGNOSIS — R42 Dizziness and giddiness: Secondary | ICD-10-CM | POA: Diagnosis not present

## 2020-07-15 DIAGNOSIS — M549 Dorsalgia, unspecified: Secondary | ICD-10-CM | POA: Diagnosis not present

## 2020-08-07 ENCOUNTER — Emergency Department: Payer: Medicare HMO

## 2020-08-07 ENCOUNTER — Emergency Department
Admission: EM | Admit: 2020-08-07 | Discharge: 2020-08-16 | Disposition: A | Discharge status: Home / Self Care | Payer: Medicare HMO | Attending: Emergency Medicine | Admitting: Emergency Medicine

## 2020-08-07 ENCOUNTER — Other Ambulatory Visit: Payer: Self-pay

## 2020-08-07 DIAGNOSIS — J45909 Unspecified asthma, uncomplicated: Secondary | ICD-10-CM | POA: Insufficient documentation

## 2020-08-07 DIAGNOSIS — F29 Unspecified psychosis not due to a substance or known physiological condition: Secondary | ICD-10-CM | POA: Diagnosis not present

## 2020-08-07 DIAGNOSIS — F332 Major depressive disorder, recurrent severe without psychotic features: Secondary | ICD-10-CM | POA: Diagnosis present

## 2020-08-07 DIAGNOSIS — Z20822 Contact with and (suspected) exposure to covid-19: Secondary | ICD-10-CM | POA: Diagnosis not present

## 2020-08-07 DIAGNOSIS — F918 Other conduct disorders: Secondary | ICD-10-CM | POA: Diagnosis not present

## 2020-08-07 DIAGNOSIS — F989 Unspecified behavioral and emotional disorders with onset usually occurring in childhood and adolescence: Secondary | ICD-10-CM | POA: Diagnosis not present

## 2020-08-07 DIAGNOSIS — R7611 Nonspecific reaction to tuberculin skin test without active tuberculosis: Secondary | ICD-10-CM | POA: Diagnosis not present

## 2020-08-07 DIAGNOSIS — M954 Acquired deformity of chest and rib: Secondary | ICD-10-CM | POA: Diagnosis not present

## 2020-08-07 DIAGNOSIS — F17210 Nicotine dependence, cigarettes, uncomplicated: Secondary | ICD-10-CM | POA: Diagnosis not present

## 2020-08-07 DIAGNOSIS — F919 Conduct disorder, unspecified: Secondary | ICD-10-CM

## 2020-08-07 DIAGNOSIS — R410 Disorientation, unspecified: Secondary | ICD-10-CM | POA: Diagnosis not present

## 2020-08-07 LAB — CBC
HCT: 34 % — ABNORMAL LOW (ref 36.0–46.0)
Hemoglobin: 11.7 g/dL — ABNORMAL LOW (ref 12.0–15.0)
MCH: 35.7 pg — ABNORMAL HIGH (ref 26.0–34.0)
MCHC: 34.4 g/dL (ref 30.0–36.0)
MCV: 103.7 fL — ABNORMAL HIGH (ref 80.0–100.0)
Platelets: 352 10*3/uL (ref 150–400)
RBC: 3.28 MIL/uL — ABNORMAL LOW (ref 3.87–5.11)
RDW: 14.1 % (ref 11.5–15.5)
WBC: 6.7 10*3/uL (ref 4.0–10.5)
nRBC: 0 % (ref 0.0–0.2)

## 2020-08-07 LAB — ACETAMINOPHEN LEVEL: Acetaminophen (Tylenol), Serum: <10 ug/mL — ABNORMAL LOW (ref 10–30)

## 2020-08-07 LAB — COMPREHENSIVE METABOLIC PANEL
ALT: 11 U/L (ref 0–44)
AST: 15 U/L (ref 15–41)
Albumin: 3.7 g/dL (ref 3.5–5.0)
Alkaline Phosphatase: 80 U/L (ref 38–126)
Anion gap: 10 (ref 5–15)
BUN: 17 mg/dL (ref 8–23)
CO2: 27 mmol/L (ref 22–32)
Calcium: 9.4 mg/dL (ref 8.9–10.3)
Chloride: 103 mmol/L (ref 98–111)
Creatinine, Ser: 0.61 mg/dL (ref 0.44–1.00)
GFR calc Af Amer: >60 mL/min (ref >60)
GFR calc non Af Amer: >60 mL/min (ref >60)
Glucose, Bld: 102 mg/dL — ABNORMAL HIGH (ref 70–99)
Potassium: 2.9 mmol/L — ABNORMAL LOW (ref 3.5–5.1)
Sodium: 140 mmol/L (ref 135–145)
Total Bilirubin: 0.4 mg/dL (ref 0.3–1.2)
Total Protein: 7.6 g/dL (ref 6.5–8.1)

## 2020-08-07 LAB — SARS CORONAVIRUS 2 BY RT PCR (HOSPITAL ORDER, PERFORMED IN ~~LOC~~ HOSPITAL LAB): SARS Coronavirus 2: NEGATIVE

## 2020-08-07 LAB — ETHANOL: Alcohol, Ethyl (B): <10 mg/dL (ref <10)

## 2020-08-07 LAB — SALICYLATE LEVEL: Salicylate Lvl: <7 mg/dL — ABNORMAL LOW (ref 7.0–30.0)

## 2020-08-07 MED ORDER — LORAZEPAM 2 MG/ML IJ SOLN
2.0000 mg | Freq: Once | INTRAMUSCULAR | Status: AC
  Administered 2020-08-08: 2 mg via INTRAMUSCULAR
  Filled 2020-08-07: qty 1

## 2020-08-07 MED ORDER — POTASSIUM CHLORIDE CRYS ER 20 MEQ PO TBCR
40.0000 meq | EXTENDED_RELEASE_TABLET | Freq: Once | ORAL | Status: AC
  Administered 2020-08-07: 40 meq via ORAL
  Filled 2020-08-07: qty 2

## 2020-08-07 MED ORDER — MELATONIN 5 MG PO TABS
10.0000 mg | ORAL_TABLET | Freq: Once | ORAL | Status: AC
  Administered 2020-08-07: 10 mg via ORAL
  Filled 2020-08-07 (×2): qty 2

## 2020-08-07 NOTE — ED Notes (Signed)
Report to include Situation, Background, Assessment, and Recommendations received from Judith RN. Patient alert and oriented, warm and dry, in no acute distress. Patient denies SI, HI, AVH and pain. Patient made aware of Q15 minute rounds and Rover and Officer presence for their safety. Patient instructed to come to me with needs or concerns.   

## 2020-08-07 NOTE — ED Provider Notes (Signed)
[facility] Emergency Department Provider Note ____________________________________________   First MD Initiated Contact with Patient 08/07/20 1155     (approximate)  I have reviewed the triage vital signs and the nursing notes.  HISTORY  Chief Complaint IVC   HPI Janet Murphy is a 64 y.o. femalewho presents to the ED for evaluation of her mental health.  Chart review indicates IBS on Linz[place], chronic pain.  A "neurocognitive disorder" is also listed throughout her chart.  History limited due to poor patient involvement   Patient is unable to provide, or is unwilling to provide, any significant history to me.  She follows my commands and appropriately seems to hear me but will not answer any my questions.  Majority of history is provided via the husband over the phone, as indicated below within my timestamp section. Husband reports that patient was a regular opiate user for many years, using oxycodone 10 mg around-the-clock for many years.  Husband reports that patient took an entire month's worth of these pain medications on July 24 or 25, he reports she took all medications in the span of 2 days.  Husband then reports that patient has not left the bedroom or gotten out of bed since that time, for the past 4 weeks.  Husband reports that patient is further smoking in the bedroom, putting cigarettes out on the sheets in the carpet.  He reports this is unusual, as she normally smokes outside due to his asthma. Husband denies any explicit concerns for suicidality.    Past Medical History:  Diagnosis Date  . Back pain   . Shoulder pain     Patient Active Problem List   Diagnosis Date Noted  . Vitamin D deficiency 06/16/2017  . B12 deficiency 06/16/2017  . Underweight 06/15/2017  . Neurocognitive disorder 04/04/2014  . DDD (degenerative disc disease), thoracic 03/22/2014    Past Surgical History:  Procedure Laterality Date  . ABDOMINAL  HYSTERECTOMY     partial     Prior to Admission medications   Medication Sig Start Date End Date Taking? Authorizing Provider  docusate sodium (COLACE) 100 MG capsule Take 1 capsule (100 mg total) by mouth 2 (two) times daily. 05/20/20 05/20/21 Yes Paduchowski, Caryn Bee, MD  gabapentin (NEURONTIN) 100 MG capsule Take 100 mg by mouth 3 (three) times daily.   Yes [provider]  HYDROcodone-acetaminophen (NORCO) 10-325 MG tablet Take 1 tablet by mouth 4 (four) times daily.   Yes [provider]  Melatonin 5 MG CAPS Take 10 mg by mouth at bedtime.   Yes [provider]    Allergies Patient has no known allergies.  Family History  Problem Relation Age of Onset  . Arthritis Mother   . Stroke Mother   . Heart attack Father   . Early death Son 26       car accident     Social History Social History   Tobacco Use  . Smoking status: Current Every Day Smoker    Packs/day: 0.50    Types: Cigarettes  . Smokeless tobacco: Never Used  Vaping Use  . Vaping Use: Never used  Substance Use Topics  . Alcohol use: No  . Drug use: No    Review of Systems  Unable to be reliably obtain due to patient's poor involvement with history taking ____________________________________________   PHYSICAL EXAM:  VITAL SIGNS: Vitals:   08/07/20 1122  BP: (!) 188/94  Pulse: 74  Resp: 16  Temp: 98 F (36.7  C)  SpO2: 96%     Constitutional: Alert. Well appearing and in no acute distress.  Follows commands. Eyes: Conjunctivae are normal. PERRL. EOMI. Head: Atraumatic. Nose: No congestion/rhinnorhea. Mouth/Throat: Mucous membranes are moist.  Oropharynx non-erythematous. Neck: No stridor. No cervical spine tenderness to palpation. Cardiovascular: Normal rate, regular rhythm. Grossly normal heart sounds.  Good peripheral circulation. Respiratory: Normal respiratory effort.  No retractions. Lungs CTAB. Gastrointestinal: Soft , nondistended, nontender to palpation. No  abdominal bruits. No CVA tenderness. Musculoskeletal: No lower extremity tenderness nor edema.  No joint effusions. No signs of acute trauma. Neurologic: No gross focal neurologic deficits are appreciated. No gait instability noted. Skin:  Skin is warm, dry and intact. No rash noted. Psychiatric: Tenderness without processes.  ____________________________________________   LABS (all labs ordered are listed, but only abnormal results are displayed)  Labs Reviewed  COMPREHENSIVE METABOLIC PANEL - Abnormal; Notable for the following components:      Result Value   Potassium 2.9 (*)    Glucose, Bld 102 (*)    All other components within normal limits  SALICYLATE LEVEL - Abnormal; Notable for the following components:   Salicylate Lvl <7.0 (*)    All other components within normal limits  ACETAMINOPHEN LEVEL - Abnormal; Notable for the following components:   Acetaminophen (Tylenol), Serum <10 (*)    All other components within normal limits  CBC - Abnormal; Notable for the following components:   RBC 3.28 (*)    Hemoglobin 11.7 (*)    HCT 34.0 (*)    MCV 103.7 (*)    MCH 35.7 (*)    All other components within normal limits  SARS CORONAVIRUS 2 BY RT PCR (HOSPITAL ORDER, PERFORMED IN [facility])  ETHANOL  URINE DRUG SCREEN, QUALITATIVE ([facility] ONLY)  URINALYSIS, COMPLETE (UACMP) WITH MICROSCOPIC   ____________________________________________  RADIOLOGY  ED MD interpretation: CT head reviewed and without evidence of acute intracranial pathology  Official radiology report(s): CT Head Wo Contrast  Result Date: 08/07/2020 CLINICAL DATA:  Mental status changes.  Disorientation. EXAM: CT HEAD WITHOUT CONTRAST TECHNIQUE: Contiguous axial images were obtained from the base of the skull through the vertex without intravenous contrast. COMPARISON:  03/04/2014 FINDINGS: Brain: The brain shows a normal appearance without evidence of malformation, atrophy, old or acute small or  large vessel infarction, mass lesion, hemorrhage, hydrocephalus or extra-axial collection. Vascular: No hyperdense vessel. No evidence of atherosclerotic calcification. Skull: Normal.  No traumatic finding.  No focal bone lesion. Sinuses/Orbits: Sinuses are clear. Orbits appear normal. Mastoids are clear. Other: None significant IMPRESSION: Normal head CT Electronically Signed   By: Paulina Fusi M.D.   On: 08/07/2020 13:11   ____________________   INITIAL IMPRESSION / ASSESSMENT AND PLAN / ED COURSE  64 year old woman presenting from home with bizarre behavior per the husband, with IVC in place, without evidence of underlying medical pathology and require psychiatric evaluation.  Mild and persistent hypertension, otherwise normal vital signs on room air.  Exam with a nonfocal neurologic examination of a patient who seems to refuse to participate in history taking.  She has no neurologic deficits that I can elicit on my examination and she is in no distress.  Blood work demonstrates hypokalemia, otherwise without evidence of acute derangements.  CT head obtained due to abnormal behavior, and does not demonstrate ICH, mass or evidence of CVA.  I discussed the case with psychiatrist in the ED, Dr.  Robert, who agrees to see the patient for further evaluation.  Further  discussed the case with oncoming physician in the ED, Dr. Erma Heritage regarding the unusual case and to maintain observation of this patient this time.  Clinical Course as of Aug 07 1546  Thu Aug 07, 2020  04-[birth year]-12[birth year] Eduard Roux, husband, opiate addict/dependent for decades. Last was July 25th, "took a whole month of pills in 2 days." "Hardly gotten out of bed since."  Smoking in bed, putting it out in the carpet.  "Always been against suicide." Their son has passed 04-26-2002. Selfish behavior.    [DS]    Clinical Course User Index [DS] Delton Prairie, MD     ____________________________________________   FINAL CLINICAL IMPRESSION(S) / ED  DIAGNOSES  Final diagnoses:  Behavior disturbance     ED Discharge Orders    None       Yameli Delamater Katrinka Blazing   Note:  This document was prepared using Dragon voice recognition software and may include unintentional dictation errors.   Delton Prairie, MD 08/07/20 [id number]

## 2020-08-07 NOTE — ED Notes (Signed)
Pt is in CT scan.

## 2020-08-07 NOTE — ED Notes (Signed)
Hourly rounding reveals patient in room. No complaints, stable, in no acute distress. Q15 minute rounds and monitoring via Rover and Officer to continue.   

## 2020-08-07 NOTE — ED Notes (Signed)
Pt returned from CT °

## 2020-08-07 NOTE — BH Assessment (Signed)
Assessment Note  Janet Murphy is an 64 y.o. female who presents to the ER, under IVC, initiated by her husband. Per the report of the husband [name]), the patient mental and emotional state has "gone down" within the last several years. However, the past several weeks it has increasingly worsened. "All she does now is sleep and eat." He believes her two sister's recent diagnosis of cancer triggered the depression. She had two sisters to passed from cancer, approximately five years ago. The patient has started smoking in the home and today (08/07/2020), she was smoking while in the bed and put the cigarette out in the carpet. Husband became concerned because he was unsure if she was trying to burn the house down. He also voiced his concern about her smoking in the home, because she's never done it because has "bad asthma." The husband further explains, the patient has history of opioid abuse disorder. She has been known to take more medications then prescribed. Which results with her not having any for several days before the refill is due. He states, she recently ingested a full bottle of medications within two days.  Writer attempted to engage with the patient but she was unable to participate. She was able to provide her name and date of birth, but all remaining answers to the questions didn't not apply. She would mumble things and then say she was okay. Thus, the information for this consult was provided by the patient's husband.  Diagnosis: Unspecified Psychosis  Past Medical History:  Past Medical History:  Diagnosis Date  . Back pain   . Shoulder pain     Past Surgical History:  Procedure Laterality Date  . ABDOMINAL HYSTERECTOMY     partial     Family History:  Family History  Problem Relation Age of Onset  . Arthritis Mother   . Stroke Mother   . Heart attack Father   . Early death Son 83       car accident     Social History:  reports that she has been smoking  cigarettes. She has been smoking about 0.50 packs per day. She has never used smokeless tobacco. She reports that she does not drink alcohol and does not use drugs.  Additional Social History:  Alcohol / Drug Use Pain Medications: See PTA Prescriptions: See PTA Over the Counter: See PTA History of alcohol / drug use?: No history of alcohol / drug abuse Longest period of sobriety (when/how long): n/a  CIWA: CIWA-Ar BP: (!) 188/94 Pulse Rate: 74 COWS:    Allergies: No Known Allergies  Home Medications: (Not in a hospital admission)   OB/GYN Status:  No LMP recorded. Patient has had a hysterectomy.  General Assessment Data Location of Assessment: [facility] ED TTS Assessment: In system Is this a Tele or Face-to-Face Assessment?: Face-to-Face Is this an Initial Assessment or a Re-assessment for this encounter?: Initial Assessment Patient Accompanied by:: N/A Language Other than English: No Living Arrangements: Other (Comment) (Private Home) What gender do you identify as?: Female Date Telepsych consult ordered in [facility]: 08/07/20 Time Telepsych consult ordered in [facility]: 1541 Marital status: Married Pregnancy Status: No Living Arrangements: Spouse/significant other Can pt return to current living arrangement?: Yes Admission Status: Involuntary Petitioner: Family member Is patient capable of signing voluntary admission?: No (Under IVC) Referral Source: Self/Family/Friend Insurance type: [employer] MCR  Medical Screening Exam [facility] Walk-in ONLY) Medical Exam completed: Yes  Crisis Care Plan Living Arrangements: Spouse/significant other Legal Guardian: Other: (Self) Name of  Psychiatrist: Reports of none Name of Therapist: Reports of none  Education Status Is patient currently in school?: No Is the patient employed, unemployed or receiving disability?: Unemployed, Receiving disability income  Risk to self with the past 6 months Suicidal Ideation: No Has patient been a risk to self  within the past 6 months prior to admission? : No Suicidal Intent: No Has patient had any suicidal intent within the past 6 months prior to admission? : No Is patient at risk for suicide?: No Suicidal Plan?: No Has patient had any suicidal plan within the past 6 months prior to admission? : No Access to Means: No What has been your use of drugs/alcohol within the last 12 months?: Reports of none Previous Attempts/Gestures: No How many times?: 0 Other Self Harm Risks: Reports of none Triggers for Past Attempts: None known Intentional Self Injurious Behavior: None Family Suicide History: Unknown Recent stressful life event(s): Other (Comment) Persecutory voices/beliefs?: No Depression: Yes Depression Symptoms: Feeling angry/irritable, Loss of interest in usual pleasures, Insomnia Substance abuse history and/or treatment for substance abuse?: No Suicide prevention information given to non-admitted patients: Not applicable  Risk to Others within the past 6 months Homicidal Ideation: No Does patient have any lifetime risk of violence toward others beyond the six months prior to admission? : No Thoughts of Harm to Others: No Current Homicidal Intent: No Current Homicidal Plan: No Access to Homicidal Means: No Identified Victim: Reports of none History of harm to others?: No Assessment of Violence: None Noted Violent Behavior Description: Reports of none Does patient have access to weapons?: No Does patient have a court date: No Is patient on probation?: No  Psychosis Hallucinations: None noted Delusions: None noted  Mental Status Report Appearance/Hygiene: In scrubs Eye Contact: Poor Motor Activity: Unremarkable, Freedom of movement Speech: Incoherent, Word salad Level of Consciousness: Alert, Restless Mood: Anxious Affect: Inconsistent with thought content Anxiety Level: Minimal Thought Processes: Flight of Ideas Judgement: Impaired Orientation: Person Obsessive  Compulsive Thoughts/Behaviors: Moderate  Cognitive Functioning Concentration: Decreased Memory: Recent Impaired, Remote Impaired Is patient IDD: No Insight: Poor Impulse Control: Poor Appetite: Fair (Per Husband) Have you had any weight changes? : Loss Amount of the weight change? (lbs):  (Unknown) Sleep: Decreased Total Hours of Sleep: 4 Vegetative Symptoms: Staying in bed  ADLScreening [facility] Services) Patient's cognitive ability adequate to safely complete daily activities?: Yes Patient able to express need for assistance with ADLs?: Yes Independently performs ADLs?: Yes (appropriate for developmental age)  Prior Inpatient Therapy Prior Inpatient Therapy: Yes Prior Therapy Dates: 03/2014 Prior Therapy Facilty/Provider(s): [facility] Reason for Treatment: Psychosis  Prior Outpatient Therapy Prior Outpatient Therapy: No Does patient have an ACCT team?: No Does patient have Intensive In-House Services?  : No Does patient have [facility] services? : No Does patient have P4CC services?: No  ADL Screening (condition at time of admission) Patient's cognitive ability adequate to safely complete daily activities?: Yes Is the patient deaf or have difficulty hearing?: No Does the patient have difficulty seeing, even when wearing glasses/contacts?: No Does the patient have difficulty concentrating, remembering, or making decisions?: No Patient able to express need for assistance with ADLs?: Yes Does the patient have difficulty dressing or bathing?: No Independently performs ADLs?: Yes (appropriate for developmental age) Does the patient have difficulty walking or climbing stairs?: No Weakness of Legs: None Weakness of Arms/Hands: None  Home Assistive Devices/Equipment Home Assistive Devices/Equipment: None  Therapy Consults (therapy consults require a physician order) PT Evaluation Needed: No OT  Evalulation Needed: No SLP Evaluation Needed: No Abuse/Neglect Assessment  (Assessment to be complete while patient is alone) Abuse/Neglect Assessment Can Be Completed: Yes Physical Abuse: Denies Verbal Abuse: Denies Sexual Abuse: Denies Exploitation of patient/patient's resources: Denies Self-Neglect: Denies Values / Beliefs Cultural Requests During Hospitalization: None Spiritual Requests During Hospitalization: None Consults Spiritual Care Consult Needed: No Transition of Care Team Consult Needed: No Advance Directives (For Healthcare) Does Patient Have a Medical Advance Directive?: Unable to assess, patient is non-responsive or altered mental status  Disposition:  Disposition Initial Assessment Completed for this Encounter: Yes  On Site Evaluation by:   Reviewed with Physician:    Lilyan Gilford MS, LCAS, [facility], NCC Therapeutic Triage Specialist 08/07/2020 7:10 PM

## 2020-08-07 NOTE — ED Notes (Signed)
TTS at bedside. 

## 2020-08-07 NOTE — ED Notes (Signed)
Pt given dinner tray.

## 2020-08-07 NOTE — BH Assessment (Signed)
Per Psyc NP Nira Conn patient is recommended for Inpatient Hospitalization

## 2020-08-07 NOTE — ED Notes (Signed)
INVOLUNTARY with all papers on chart, awaiting TTS/PSYCH consult °

## 2020-08-07 NOTE — ED Notes (Signed)
Dressed out by this Doctor, hospital with patient. 2 golden colored rings with diamond like stone in middle of one, gown, shoes and sleep dress placed in labeled belonging bag given to Amy, RN

## 2020-08-07 NOTE — ED Triage Notes (Signed)
Pt to ED with [facility]SD from home after husband took out IVC papers on patient. Pt does not answer any questions, oriented to person disoriented to time, situation and place.  Pt uncooperative with answering questions. There is some speculation that pt is taking too many of her meds. Pt denies, denies SI.

## 2020-08-07 NOTE — ED Notes (Signed)
xtra blankets provided along with a drink   Lights dimmed

## 2020-08-08 MED ORDER — DULOXETINE HCL 20 MG PO CPEP
20.0000 mg | ORAL_CAPSULE | Freq: Every day | ORAL | Status: DC
  Administered 2020-08-08 – 2020-08-12 (×5): 20 mg via ORAL
  Filled 2020-08-08 (×5): qty 1

## 2020-08-08 MED ORDER — OLANZAPINE 2.5 MG PO TABS
1.2500 mg | ORAL_TABLET | Freq: Two times a day (BID) | ORAL | Status: DC
  Administered 2020-08-08 – 2020-08-09 (×2): 1.25 mg via ORAL
  Filled 2020-08-08 (×2): qty 1

## 2020-08-08 MED ORDER — ACETAMINOPHEN 325 MG PO TABS
650.0000 mg | ORAL_TABLET | Freq: Once | ORAL | Status: AC
  Administered 2020-08-08: 650 mg via ORAL
  Filled 2020-08-08: qty 2

## 2020-08-08 MED ORDER — ACETAMINOPHEN 325 MG PO TABS
650.0000 mg | ORAL_TABLET | Freq: Four times a day (QID) | ORAL | Status: DC | PRN
  Administered 2020-08-08 – 2020-08-11 (×3): 650 mg via ORAL
  Filled 2020-08-08 (×3): qty 2

## 2020-08-08 NOTE — ED Notes (Signed)
Hourly rounding reveals patient in room. No complaints, stable, in no acute distress. Q15 minute rounds and monitoring via Rover and Officer to continue.   

## 2020-08-08 NOTE — BH Assessment (Signed)
Writer spoke with the patient to complete an updated/reassessment. Patient continue to be psychotic. Her speech is word salad and thoughts are unorganized.

## 2020-08-08 NOTE — BH Assessment (Addendum)
Referral information for Psychiatric Hospitalization faxed to;   [facility] [phone]),   . [facility] [phone]- [phone])   . [facility] [phone]),  . [facility] [phone]),   . [facility] [phone] or [phone])  . [place] [phone] or [phone]),   . [name] [phone]).

## 2020-08-08 NOTE — ED Notes (Signed)
Shower supplies and new scrubs given. Pt is currently taking a shower.

## 2020-08-08 NOTE — ED Notes (Signed)
Pt comes out of her room every minute or so asking for 3 blankets.  Pt has approx 8-10 blankets in her room.  She piles them in the corner and asks for more.  Officer and myself tell her no more blankets and to lay down and we will cover her with the ones in her room.  Pt walked into another patients room to take her blankets.  Officer and myself redirected her back to her room.  Pt immed came back out and asked for 3 blankets.  Pt redirected back to her room.

## 2020-08-08 NOTE — Consult Note (Signed)
Janet Candise Bowens MD Psychiatry Note 08/08/20  Patient with probable dementia and opiate dependence --Needs gero psych bed   Attempted to leave message with husband on her issues of opioid dependence for many years as well as dementia depression and dysfunction  Patient is poor historian and does not make full sense  Yet   Note pending when I can get more history from husband

## 2020-08-08 NOTE — BH Assessment (Addendum)
This Clinical research associate completed reassessment of patient, patient appears confused and reports SI when asked if patient had a plan patient proceeded to report "shampoo, conditioner, toothpaste, soap." Writer tried to gain further information but patient continued to be confused.   Patient has a history of being diagnosed with Memory Disorder, Dementia in 2015. Psyc NP Nira Conn recommended Inpatient Hospitalization but patient has not been seen by a psychiatrist face to face, this writer spoke with EDP Dr. Dolores Frame and discussed that patient should be re-evaluated by a Psyc provider face to face to rule out any memory issues/dementia.

## 2020-08-08 NOTE — ED Notes (Signed)
VS checked and breakfast meal provided. No other needs found at this moment. 

## 2020-08-08 NOTE — ED Notes (Signed)
Patient complaining of left hip pain.  Dr. Erma Heritage alerted.  Patient is ambulatory.  NAD. Gait steady.  Posture upright and relaxed.  NAD

## 2020-08-08 NOTE — ED Provider Notes (Signed)
Emergency Medicine Observation Re-evaluation Note  Janet Murphy is a 64 y.o. female, seen on rounds today.  Pt initially presented to the ED for complaints of IVC Currently, the patient is resting in no acute distress, voices no complaints.  Physical Exam  BP (!) 167/76 (BP Location: Right Arm)   Pulse (!) 57   Temp 98 F (36.7 C) (Oral)   Resp 18   Ht 5\' 7"  (1.702 m)   Wt 44 kg   SpO2 99%   BMI 15.19 kg/m  Physical Exam General: Resting comfortably Cardiac: Regular rate Lungs: No increased work of breathing Psych: No agitation  ED Course / MDM  EKG:  Clinical Course as of Aug 09 647  Thu Aug 07, 2020  1220 Aug 09, 2020, husband, opiate addict/dependent for decades. Last was July 25th, "took a whole month of pills in 2 days." "Hardly gotten out of bed since."  Smoking in bed, putting it out in the carpet.  "Always been against suicide." Their son has passed 03-31-2002. Selfish behavior.    [DS]    Clinical Course User Index [DS] 2004, MD   I have reviewed the labs performed to date as well as medications administered while in observation.  Recent changes in the last 24 hours include patient received 2 mg IV Ativan overnight for agitation with success and has remained calm since.  Plan  Current plan is for psychiatric disposition. Patient is under full IVC at this time.   Delton Prairie, MD 08/08/20 [phone]

## 2020-08-08 NOTE — ED Notes (Signed)
Pt coming from the bathroom, asking to speak with the RN, Hewan RN over to speak with pt, pt requesting to go outside and smoke, pt informed that smoking was not allowed on the hospital campus and offered the patient a nicotine patch, hewan rN also reports to this RN that she appears to be more clear minded than what she was when she originally arrived to the ER

## 2020-08-08 NOTE — ED Notes (Signed)
Patient walking around unit with toilet paper in both ears.  C/O ear infections.  Patient's husband voicing concerns about patient's 'ear infections'.  Dr. Erma Heritage alerted to patient's complaint of ear infections.  Continue to monitor.

## 2020-08-08 NOTE — ED Notes (Signed)
Pt out of room again asking the officer to speak with a RN

## 2020-08-09 MED ORDER — AMLODIPINE BESYLATE 5 MG PO TABS
5.0000 mg | ORAL_TABLET | Freq: Once | ORAL | Status: AC
  Administered 2020-08-09: 5 mg via ORAL
  Filled 2020-08-09: qty 1

## 2020-08-09 MED ORDER — OLANZAPINE 2.5 MG PO TABS
2.5000 mg | ORAL_TABLET | Freq: Two times a day (BID) | ORAL | Status: DC
  Administered 2020-08-09 – 2020-08-12 (×6): 2.5 mg via ORAL
  Filled 2020-08-09 (×6): qty 1

## 2020-08-09 NOTE — ED Notes (Addendum)
Report to include Situation, Background, Assessment, and Recommendations received from Jessica RN. Patient alert, warm and dry, in no acute distress. Patient denies SI, HI, AVH and pain. Patient made aware of Q15 minute rounds and security cameras for their safety. Patient instructed to come to me with needs or concerns. 

## 2020-08-09 NOTE — BH Assessment (Addendum)
Referral check for Psychiatric Hospitalization:    [facility] ([phone]- [phone]), 12:47 No answer   [facility] (-[phone] -or- [phone]) 12:41 Per Hilda Lias the admissions office was not open and asked for a call back in the morning.    [facility] ([phone]), 1:02 No answer   [facility] [phone]), 12:52 AM No answer   [facility] [phone] or [phone]) 12: 23 Per Kendal Hymen their facility is currently not accepting gero or adolescent patients at this time.    [facility] [phone] or [phone]) Per Melissa at 12:25 There is no one in inquiry however she would make a note in patient's chart.    [name] [phone]). 12:50 Left a voicemail.

## 2020-08-09 NOTE — ED Provider Notes (Signed)
Emergency Medicine Observation Re-evaluation Note  Janet Murphy is a 64 y.o. female, seen on rounds today.  Pt initially presented to the ED for complaints of IVC Currently, the patient is sitting in bed, calm, without complaints.  Physical Exam  BP (!) 164/87 (BP Location: Right Arm)   Pulse 64   Temp 97.8 F (36.6 C) (Oral)   Resp 16   Ht 1.702 m (5\' 7" )   Wt 44 kg   SpO2 97%   BMI 15.19 kg/m  Physical Exam General: nad Cardiac: warm and well perfused Lungs: no increased wob Psych: calm, no issues reported overnight  ED Course / MDM  EKG:EKG Interpretation  Date/Time:  Thursday August 07 2020 13:17:48 EDT Ventricular Rate:  67 PR Interval:  156 QRS Duration: 70 QT Interval:  404 QTC Calculation: 426 R Axis:   57 Text Interpretation: Normal sinus rhythm Normal ECG Confirmed by UNCONFIRMED, DOCTOR (06[birth year]13-2002), editor [id number], Tammy [phone]) on 08/08/2020 9:55:16 AM  Clinical Course as of Aug 10 747  Thu Aug 07, 2020  1220 Aug 09, 2020, husband, opiate addict/dependent for decades. Last was July 25th, "took a whole month of pills in 2 days." "Hardly gotten out of bed since."  Smoking in bed, putting it out in the carpet.  "Always been against suicide." Their son has passed 04/08/02. Selfish behavior.    [DS]    Clinical Course User Index [DS] 2004, MD     Plan  Current plan is for geropsychiatric placement. Patient is under full IVC at this time.   Delton Prairie, MD 08/09/20 [phone]

## 2020-08-09 NOTE — ED Notes (Signed)
Hourly rounding reveals patient sleeping in room. No complaints, stable, in no acute distress. Q15 minute rounds and monitoring via Security Cameras to continue. 

## 2020-08-09 NOTE — ED Provider Notes (Signed)
Emergency Medicine Observation Re-evaluation Note  Janet Murphy is a 64 y.o. female, seen on rounds today.  Pt initially presented to the ED for complaints of IVC Currently, the patient is resting calmly.  Physical Exam  BP (!) 177/85 (BP Location: Right Arm)   Pulse 63   Temp 98.2 F (36.8 C) (Oral)   Resp 17   Ht 5\' 7"  (1.702 m)   Wt 44 kg   SpO2 96%   BMI 15.19 kg/m  Physical Exam Vitals and nursing note reviewed.  HENT:     Head: Normocephalic and atraumatic.     Right Ear: External ear normal.     Left Ear: External ear normal.  Pulmonary:     Effort: No respiratory distress.  Neurological:     General: No focal deficit present.     Mental Status: She is alert.  Psychiatric:        Mood and Affect: Mood normal.      ED Course / MDM  EKG:EKG Interpretation  Date/Time:  Thursday August 07 2020 13:17:48 EDT Ventricular Rate:  67 PR Interval:  156 QRS Duration: 70 QT Interval:  404 QTC Calculation: 426 R Axis:   57 Text Interpretation: Normal sinus rhythm Normal ECG Confirmed by UNCONFIRMED, DOCTOR (06-13-2003), editor [id number], Tammy [phone]) on 08/08/2020 9:55:16 AM  Clinical Course as of Aug 09 905  Thu Aug 07, 2020  1220 Aug 09, 2020, husband, opiate addict/dependent for decades. Last was July 25th, "took a whole month of pills in 2 days." "Hardly gotten out of bed since."  Smoking in bed, putting it out in the carpet.  "Always been against suicide." Their son has passed 03-31-02. Selfish behavior.    [DS]    Clinical Course User Index [DS] 2004, MD   I have reviewed the labs performed to date as well as medications administered while in observation.  Recent changes in the last 24 hours include none.  Plan  Current plan is for geropsychiatric placement. Patient is under full IVC at this time.    Delton Prairie, MD 08/09/20 1106

## 2020-08-09 NOTE — BH Assessment (Signed)
Referral check for Psychiatric Hospitalization:   Janet Murphy ([phone]- [phone]), 9:01 PM No answer   [facility] (-[phone]- [phone]) 8:59PM Per Bonita Quin, there is was no admission staff available. Bonita Quin took this writer's contact number and agreed to have someone call back in the morning.   [facility] [phone]), 8:57PM No answer   [facility] [phone]), 9:04 PM There are no appropriate beds available at this time.   [facility] [phone] or [phone]) 12: 23 Per Kendal Hymen their facility is currently not accepting gero or adolescent patients at this time.    [facility] [phone] or [phone]) 8:55 PM Left a message.   [name] [phone]). 8:45 PM Left a voicemail.

## 2020-08-09 NOTE — BH Assessment (Addendum)
Writer spoke with the patient to complete an updated/reassessment. Patient continues to be psychotic and have disorganized thoughts.

## 2020-08-10 DIAGNOSIS — F332 Major depressive disorder, recurrent severe without psychotic features: Secondary | ICD-10-CM | POA: Diagnosis not present

## 2020-08-10 NOTE — ED Notes (Signed)
Hourly rounding reveals patient sleeping in room. No complaints, stable, in no acute distress. Q15 minute rounds and monitoring via Security Cameras to continue. 

## 2020-08-10 NOTE — ED Notes (Signed)
Writer went in patients room and offered to help her eat and asked if she wanted a shower, patient refused to eat or take a shower

## 2020-08-10 NOTE — ED Notes (Signed)
Patient took a bite of her sandwich for lunch and threw the rest of the lunch in the tray on the floor, patient refused to get out of bed and clean her room

## 2020-08-10 NOTE — ED Notes (Signed)
Pt refused to bring trash from her room to throw away, pt also refused dinner tray. RN aware.

## 2020-08-10 NOTE — Consult Note (Signed)
[facility] Face-to-Face Psychiatry Consult   Reason for Consult:  Depression with suicidal ideations Referring Physician:  EDP Patient Identification: Janet Murphy MRN:  [id number] Principal Diagnosis: Major depressive disorder, recurrent severe without psychotic features (HCC) Diagnosis:  Principal Problem:   Major depressive disorder, recurrent severe without psychotic features (HCC)   Total Time spent with patient: 45 minutes  Subjective:   Janet Murphy is a 64 y.o. female patient admitted with severe depression.  "I need another blanket:"  HPI per [facility] Specialist Robinette Haines: Janet Murphy is an 64 y.o. female who presents to the ER, under IVC, initiated by her husband. Per the report of the husband [name]), the patient mental and emotional state has "gone down" within the last several years. However, the past several weeks it has increasingly worsened. "All she does now is sleep and eat." He believes her two sister's recent diagnosis of cancer triggered the depression. She had two sisters to passed from cancer, approximately five years ago. The patient has started smoking in the home and today (08/07/2020), she was smoking while in the bed and put the cigarette out in the carpet. Husband became concerned because he was unsure if she was trying to burn the house down. He also voiced his concern about her smoking in the home, because she's never done it because has "bad asthma." The husband further explains, the patient has history of opioid abuse disorder. She has been known to take more medications then prescribed. Which results with her not having any for several days before the refill is due. He states, she recently ingested a full bottle of medications within two days.  Writer attempted to engage with the patient but she was unable to participate. She was able to provide her name and date of birth, but all remaining answers to the questions didn't not apply. She would mumble  things and then say she was okay. Thus, the information for this consult was provided by the patient's husband.  Past Psychiatric History: depression  Risk to Self: Suicidal Ideation: No Suicidal Intent: No Is patient at risk for suicide?: No Suicidal Plan?: No Access to Means: No What has been your use of drugs/alcohol within the last 12 months?: Reports of none How many times?: 0 Other Self Harm Risks: Reports of none Triggers for Past Attempts: None known Intentional Self Injurious Behavior: None Risk to Others: Homicidal Ideation: No Thoughts of Harm to Others: No Current Homicidal Intent: No Current Homicidal Plan: No Access to Homicidal Means: No Identified Victim: Reports of none History of harm to others?: No Assessment of Violence: None Noted Violent Behavior Description: Reports of none Does patient have access to weapons?: No Does patient have a court date: No Prior Inpatient Therapy: Prior Inpatient Therapy: Yes Prior Therapy Dates: 03/2014 Prior Therapy Facilty/Provider(s): [facility] Reason for Treatment: Psychosis Prior Outpatient Therapy: Prior Outpatient Therapy: No Does patient have an ACCT team?: No Does patient have Intensive In-House Services?  : No Does patient have [facility] services? : No Does patient have P4CC services?: No  Past Medical History:  Past Medical History:  Diagnosis Date  . Back pain   . Shoulder pain     Past Surgical History:  Procedure Laterality Date  . ABDOMINAL HYSTERECTOMY     partial    Family History:  Family History  Problem Relation Age of Onset  . Arthritis Mother   . Stroke Mother   . Heart attack Father   . Early death Son 85  car accident    Family Psychiatric  History: none Social History:  Social History   Substance and Sexual Activity  Alcohol Use No     Social History   Substance and Sexual Activity  Drug Use No    Social History   Socioeconomic History  . Marital status: Married     Spouse name: Not on file  . Number of children: Not on file  . Years of education: Not on file  . Highest education level: Not on file  Occupational History  . Not on file  Tobacco Use  . Smoking status: Current Every Day Smoker    Packs/day: 0.50    Types: Cigarettes  . Smokeless tobacco: Never Used  Vaping Use  . Vaping Use: Never used  Substance and Sexual Activity  . Alcohol use: No  . Drug use: No  . Sexual activity: Yes  Other Topics Concern  . Not on file  Social History Narrative  . Not on file   Social Determinants of Health   Financial Resource Strain:   . Difficulty of Paying Living Expenses: Not on file  Food Insecurity:   . Worried About Programme researcher, broadcasting/film/video in the Last Year: Not on file  . Ran Out of Food in the Last Year: Not on file  Transportation Needs:   . Lack of Transportation (Medical): Not on file  . Lack of Transportation (Non-Medical): Not on file  Physical Activity:   . Days of Exercise per Week: Not on file  . Minutes of Exercise per Session: Not on file  Stress:   . Feeling of Stress : Not on file  Social Connections:   . Frequency of Communication with Friends and Family: Not on file  . Frequency of Social Gatherings with Friends and Family: Not on file  . Attends Religious Services: Not on file  . Active Member of Clubs or Organizations: Not on file  . Attends Banker Meetings: Not on file  . Marital Status: Not on file   Additional Social History:    Allergies:  No Known Allergies  Labs: No results found for this or any previous visit (from the past 48 hour(s)).  Current Facility-Administered Medications  Medication Dose Route Frequency Provider Last Rate Last Admin  . acetaminophen (TYLENOL) tablet 650 mg  650 mg Oral Q6H PRN Shaune Pollack, MD   650 mg at 08/08/20 2153  . DULoxetine (CYMBALTA) DR capsule 20 mg  20 mg Oral Daily Roselind Messier, MD   20 mg at 08/10/20 1030  . OLANZapine (ZYPREXA) tablet 2.5 mg  2.5  mg Oral BID Charm Rings, NP   2.5 mg at 08/10/20 1030   Current Outpatient Medications  Medication Sig Dispense Refill  . docusate sodium (COLACE) 100 MG capsule Take 1 capsule (100 mg total) by mouth 2 (two) times daily. 60 capsule 2  . gabapentin (NEURONTIN) 100 MG capsule Take 100 mg by mouth 3 (three) times daily.    Marland Kitchen HYDROcodone-acetaminophen (NORCO) 10-325 MG tablet Take 1 tablet by mouth 4 (four) times daily.    . Melatonin 5 MG CAPS Take 10 mg by mouth at bedtime.      Musculoskeletal: Strength & Muscle Tone: decreased Gait & Station: normal Patient leans: N/A  Psychiatric Specialty Exam: Physical Exam Vitals and nursing note reviewed.  Constitutional:      Appearance: Normal appearance.  HENT:     Head: Normocephalic.     Nose: Nose normal.  Pulmonary:  Effort: Pulmonary effort is normal.  Musculoskeletal:        General: Normal range of motion.     Cervical back: Normal range of motion.  Neurological:     General: No focal deficit present.     Mental Status: She is alert and oriented to person, place, and time.  Psychiatric:        Attention and Perception: She is inattentive.        Mood and Affect: Mood is depressed.        Speech: Speech normal.        Behavior: Behavior is withdrawn.        Thought Content: Thought content normal.        Cognition and Memory: Cognition is impaired. Memory is impaired.        Judgment: Judgment is inappropriate.     Review of Systems  Psychiatric/Behavioral: Positive for dysphoric mood and suicidal ideas.  All other systems reviewed and are negative.   Blood pressure (!) 178/91, pulse (!) 59, temperature 98.5 F (36.9 C), temperature source Oral, resp. rate 17, height 5\' 7"  (1.702 m), weight 44 kg, SpO2 97 %.Body mass index is 15.19 kg/m.  General Appearance: Disheveled  Eye Contact:  Fair  Speech:  Normal Rate  Volume:  Decreased  Mood:  Depressed  Affect:  Congruent  Thought Process:  Irrelevant   Orientation:  Other:  person and place  Thought Content:  Rumination  Suicidal Thoughts:  Yes.  with intent/plan  Homicidal Thoughts:  No  Memory:  Immediate;   Poor Recent;   poor Remote;   Poor  Judgement:  Poor  Insight:  Lacking  Psychomotor Activity:  Decreased  Concentration:  Concentration: Poor and Attention Span: Poor  Recall:  Poor  Fund of Knowledge:  Fair  Language:  Fair  Akathisia:  No  Handed:  Right  AIMS (if indicated):     Assets:  Housing Leisure Time Resilience Social Support  ADL's:  Intact  Cognition:  Impaired,  Mild  Sleep:        Treatment Plan Summary: Daily contact with patient to assess and evaluate symptoms and progress in treatment, Medication management and Plan major depressive disorder, recurrent, severe without psychosis:  -Admit to geriatric hospital -Increase Cymbalta 20 mg daily to 30 mg daily -Continue Zyprexa 2.5 mg BID  Disposition: Recommend psychiatric Inpatient admission when medically cleared.  , NP 08/10/2020 3:32 PM

## 2020-08-10 NOTE — BH Assessment (Addendum)
Referral Check:    Janet Murphy ([phone]- [phone]), No answer   [facility] [phone]- [phone])John asked to re-send referral, task completed 4pm   Janet Murphy ([phone]---[phone]),No answer   Janet Murphy [phone] answer    [facility] [phone] or [phone])No answer    Janet Murphy [phone] or [phone]) [name] asked to re-send referral, task completed 4pm    [name] [phone]).Robin asked to re-send referral to ([phone]), task completed 4pm

## 2020-08-10 NOTE — ED Notes (Signed)
Patient had a visit from husband and nurse asked him, how is wifes appetite at home and he stated that she "picks at food", nurse informed him that she doesn't eat much, husband is pleasant

## 2020-08-11 MED ORDER — NAPROXEN 500 MG PO TABS
500.0000 mg | ORAL_TABLET | Freq: Two times a day (BID) | ORAL | Status: AC | PRN
  Filled 2020-08-11: qty 1

## 2020-08-11 MED ORDER — DICYCLOMINE HCL 20 MG PO TABS
20.0000 mg | ORAL_TABLET | Freq: Four times a day (QID) | ORAL | Status: AC | PRN
  Filled 2020-08-11: qty 1

## 2020-08-11 MED ORDER — METHOCARBAMOL 500 MG PO TABS
500.0000 mg | ORAL_TABLET | Freq: Three times a day (TID) | ORAL | Status: AC | PRN
  Filled 2020-08-11: qty 1

## 2020-08-11 MED ORDER — ONDANSETRON 4 MG PO TBDP
4.0000 mg | ORAL_TABLET | Freq: Four times a day (QID) | ORAL | Status: AC | PRN
  Filled 2020-08-11: qty 1

## 2020-08-11 MED ORDER — LOPERAMIDE HCL 2 MG PO CAPS
2.0000 mg | ORAL_CAPSULE | ORAL | Status: AC | PRN
  Filled 2020-08-11: qty 2

## 2020-08-11 MED ORDER — HYDROXYZINE HCL 25 MG PO TABS
25.0000 mg | ORAL_TABLET | Freq: Four times a day (QID) | ORAL | Status: AC | PRN
  Administered 2020-08-11 – 2020-08-16 (×3): 25 mg via ORAL
  Filled 2020-08-11 (×4): qty 1

## 2020-08-11 MED ORDER — POTASSIUM CHLORIDE CRYS ER 20 MEQ PO TBCR
40.0000 meq | EXTENDED_RELEASE_TABLET | Freq: Two times a day (BID) | ORAL | Status: AC
  Administered 2020-08-11 – 2020-08-12 (×3): 40 meq via ORAL
  Filled 2020-08-11 (×3): qty 2

## 2020-08-11 NOTE — ED Provider Notes (Signed)
Emergency Medicine Observation Re-evaluation Note  Janet Murphy is a 64 y.o. female, seen on rounds today.  Pt initially presented to the ED for complaints of IVC Currently, the patient is resting.  Physical Exam  BP (!) 166/90 (BP Location: Left Arm)   Pulse 79   Temp 98.4 F (36.9 C) (Oral)   Resp 18   Ht 5\' 7"  (1.702 m)   Wt 44 kg   SpO2 98%   BMI 15.19 kg/m  Physical Exam General: resting Cardiac: well perfused Lungs: spontaneous respirations Psych: calm  ED Course / MDM  EKG:EKG Interpretation  Date/Time:  Thursday August 07 2020 13:17:48 EDT Ventricular Rate:  67 PR Interval:  156 QRS Duration: 70 QT Interval:  404 QTC Calculation: 426 R Axis:   57 Text Interpretation: Normal sinus rhythm Normal ECG Confirmed by UNCONFIRMED, DOCTOR (06-13-2003), editor [id number], Tammy [phone]) on 08/08/2020 9:55:16 AM  Clinical Course as of Aug 12 719  Thu Aug 07, 2020  Apr 02, 1219 1221, husband, opiate addict/dependent for decades. Last was July 25th, "took a whole month of pills in 2 days." "Hardly gotten out of bed since."  Smoking in bed, putting it out in the carpet.  "Always been against suicide." Their son has passed 2002/04/01. Selfish behavior.    [DS]    Clinical Course User Index [DS] 2004, MD   I have reviewed the labs performed to date as well as medications administered while in observation.  Recent changes in the last 24 hours include none.  Plan  Current plan is for geriatric psych placement. Patient is under full IVC at this time.   Delton Prairie, MD 08/11/20 [phone]

## 2020-08-11 NOTE — BH Assessment (Signed)
   Janet Murphy [phone] answer   [facility] [phone]- [phone])John asked to re-send referral, task completed 4pm   Janet Murphy [phone]---[phone]),No answer   Janet Murphy [phone] answer    [facility] [phone] or [phone])No answer    Janet Murphy [phone] or [phone]) Patient currently under review   Janet Murphy [phone]).Janet Murphy asked to re-send referral to [phone]), task completed 4pm

## 2020-08-11 NOTE — ED Notes (Signed)
Pt given snack tray and drink 

## 2020-08-11 NOTE — ED Notes (Signed)
Patient has flat affect, is guarded and does not follow commands, or directions, nurse will continue to coax her along to take medications and to communicate, will also continue to monitor for safety.

## 2020-08-11 NOTE — ED Notes (Signed)
Patient's husband called and states that he hopes Doctor will call him back shortly, because He had missed His phone call and really wants to know how His wife is doing and what will be the plan, Nurse let him know that she would pass the message along to Doctor.

## 2020-08-11 NOTE — ED Notes (Signed)
1700 Pt went to the bathroom on food tray lid in the room. I explained to the pt that she needs to use the restroom. She then brought the lid by the bathroom where it was spilled. I called [facility] for a clean up.

## 2020-08-11 NOTE — ED Notes (Signed)
Patient transferred to room 2 for more warmth. Patient was hesitant, but she did ambulate to room, she just kept repeating turn the TV off, security turned it off and she said " I'm ok ,I'm ok, over and over again, Nurse will continue to monitor.

## 2020-08-12 LAB — COMPREHENSIVE METABOLIC PANEL
ALT: 17 U/L (ref 0–44)
AST: 16 U/L (ref 15–41)
Albumin: 4.2 g/dL (ref 3.5–5.0)
Alkaline Phosphatase: 79 U/L (ref 38–126)
Anion gap: 9 (ref 5–15)
BUN: 19 mg/dL (ref 8–23)
CO2: 27 mmol/L (ref 22–32)
Calcium: 9 mg/dL (ref 8.9–10.3)
Chloride: 103 mmol/L (ref 98–111)
Creatinine, Ser: 0.72 mg/dL (ref 0.44–1.00)
GFR calc Af Amer: >60 mL/min (ref >60)
GFR calc non Af Amer: >60 mL/min (ref >60)
Glucose, Bld: 99 mg/dL (ref 70–99)
Potassium: 4.7 mmol/L (ref 3.5–5.1)
Sodium: 139 mmol/L (ref 135–145)
Total Bilirubin: 0.4 mg/dL (ref 0.3–1.2)
Total Protein: 7.8 g/dL (ref 6.5–8.1)

## 2020-08-12 LAB — URINALYSIS, COMPLETE (UACMP) WITH MICROSCOPIC
Bilirubin Urine: NEGATIVE
Glucose, UA: NEGATIVE mg/dL
Hgb urine dipstick: NEGATIVE
Ketones, ur: NEGATIVE mg/dL
Nitrite: NEGATIVE
Protein, ur: NEGATIVE mg/dL
Specific Gravity, Urine: 1.011 (ref 1.005–1.030)
pH: 5 (ref 5.0–8.0)

## 2020-08-12 LAB — CBC WITH DIFFERENTIAL/PLATELET
Abs Immature Granulocytes: 0.02 10*3/uL (ref 0.00–0.07)
Basophils Absolute: 0.1 10*3/uL (ref 0.0–0.1)
Basophils Relative: 1 %
Eosinophils Absolute: 0.1 10*3/uL (ref 0.0–0.5)
Eosinophils Relative: 1 %
HCT: 39.3 % (ref 36.0–46.0)
Hemoglobin: 13.2 g/dL (ref 12.0–15.0)
Immature Granulocytes: 0 %
Lymphocytes Relative: 46 %
Lymphs Abs: 3.3 10*3/uL (ref 0.7–4.0)
MCH: 35.1 pg — ABNORMAL HIGH (ref 26.0–34.0)
MCHC: 33.6 g/dL (ref 30.0–36.0)
MCV: 104.5 fL — ABNORMAL HIGH (ref 80.0–100.0)
Monocytes Absolute: 0.6 10*3/uL (ref 0.1–1.0)
Monocytes Relative: 9 %
Neutro Abs: 3 10*3/uL (ref 1.7–7.7)
Neutrophils Relative %: 43 %
Platelets: 399 10*3/uL (ref 150–400)
RBC: 3.76 MIL/uL — ABNORMAL LOW (ref 3.87–5.11)
RDW: 14.4 % (ref 11.5–15.5)
WBC: 7.1 10*3/uL (ref 4.0–10.5)
nRBC: 0 % (ref 0.0–0.2)

## 2020-08-12 LAB — URINE DRUG SCREEN, QUALITATIVE (ARMC ONLY)
Amphetamines, Ur Screen: NOT DETECTED
Barbiturates, Ur Screen: NOT DETECTED
Benzodiazepine, Ur Scrn: NOT DETECTED
Cannabinoid 50 Ng, Ur ~~LOC~~: NOT DETECTED
Cocaine Metabolite,Ur ~~LOC~~: NOT DETECTED
MDMA (Ecstasy)Ur Screen: NOT DETECTED
Methadone Scn, Ur: NOT DETECTED
Opiate, Ur Screen: NOT DETECTED
Phencyclidine (PCP) Ur S: NOT DETECTED
Tricyclic, Ur Screen: NOT DETECTED

## 2020-08-12 MED ORDER — DIPHENHYDRAMINE HCL 12.5 MG/5ML PO ELIX
12.5000 mg | ORAL_SOLUTION | Freq: Every day | ORAL | Status: DC
  Administered 2020-08-12 – 2020-08-14 (×3): 12.5 mg via ORAL
  Filled 2020-08-12 (×6): qty 5

## 2020-08-12 MED ORDER — NORTRIPTYLINE HCL 10 MG PO CAPS
20.0000 mg | ORAL_CAPSULE | Freq: Every day | ORAL | Status: DC
  Administered 2020-08-12 – 2020-08-14 (×3): 20 mg via ORAL
  Filled 2020-08-12 (×5): qty 2

## 2020-08-12 MED ORDER — THIOTHIXENE 2 MG PO CAPS
2.0000 mg | ORAL_CAPSULE | Freq: Every day | ORAL | Status: DC
  Administered 2020-08-13 – 2020-08-14 (×2): 2 mg via ORAL
  Filled 2020-08-12 (×5): qty 1

## 2020-08-12 NOTE — ED Notes (Signed)
Offered patient snack, she stated she did not want anything.AS

## 2020-08-12 NOTE — ED Notes (Signed)
Pt asked about SI and she stated "I,m okay", answers questions with "I'm okay". Pt does not appear to be responding to internal stimuli at time of assessment. Unable to answer "yes or no" to SI assessment. Currently in bed resting quietly in bed.

## 2020-08-12 NOTE — BH Assessment (Addendum)
Janet Murphy contacted TTS from the [facility] lobby requesting to talk with someone regarding pt's care and current disposition. Janet Murphy reports to be unable to recall the discussion held 20 minutes ago by phone with TTS regarding pt's acceptance to [facility]. Janet Murphy expressed [facility] to be to far of a distance for him to drive and asked for pt to receive INPT at local hospital such as [facility] or [facility]. Janet Murphy expressed concern with Pt's treatment for an ear infection she was diagnosed with at [facility] 2 weeks ago and current opioid addiction. TTS provided as much information possible at this time. Janet Murphy reports to understand pt will remain in B[facility] until placement is located and understands pt is currently on an Opioid protocal. TTS agreed to deliver his request to be contacted by MD to discuss pt ear infection treatment.   Janet Murphy reports plans to return to the hospital at 4pm to visit with pt.   TTS consulted with provider regarding [name]'s request and concerns. Due to [name]'s refusal and pt's cognitive delay, TTS canceled placement and will continue the referral process.

## 2020-08-12 NOTE — BH Assessment (Addendum)
   Janet Murphy [phone]- [phone]),Janet Murphy reports no current beds available   [facility] [phone]- [phone])Janet Murphy reports patient does not meet criteria for Inpatient   Janet Murphy [phone]),No answer   Janet Murphy [phone]), Re-faxed referral on 08/12/20 at 5:24am   [facility] [phone] or [phone])No answer   Janet Murphy [phone] or [phone])Patient currently under review   Janet Murphy [phone]).Janet Murphy asked to re-send referral to ([phone]), task completed 4pm

## 2020-08-12 NOTE — BH Assessment (Signed)
TTS was contacted by Jinger Neighbors) reporting pt to be under review and requesting an update on the following before acceptance: CBC, CMP, UA, DS, Chest xray and updated COVID test if accepted.   TTS communicated needs to current medical team Nicole Cella, RN and Ovidio Kin, MD.

## 2020-08-12 NOTE — BH Assessment (Deleted)
PATIENT IS SCHEDULED FOR ADMISSION ANYTIME TOMORROW   Patient has been accepted to [facility].  Patient assigned to: [address] Unit  Accepting physician is Dr. Manson Passey  Call report to [phone] Representative was Asher Muir.    ER Staff is aware of it:  Glenda, ER Secretary  Lenard Lance, ER MD  Nicole Cella Patient's Nurse     Patient's Family/Support System Ermelinda Das (Spouse) , [phone]) have been updated as well.

## 2020-08-12 NOTE — ED Provider Notes (Signed)
Emergency Medicine Observation Re-evaluation Note  Janet Murphy is a 64 y.o. female, seen on rounds today.  Pt initially presented to the ED for complaints of IVC Currently, the patient is awake and alert, currently sitting up in bed had just finished breakfast.  Has no medical complaints.  Patient is confused but this is likely baseline.  No distress.Marland Kitchen  Physical Exam  BP (!) 154/99 (BP Location: Right Arm)    Pulse 86    Temp 98.9 F (37.2 C) (Oral)    Resp 18    Ht 5\' 7"  (1.702 m)    Wt 44 kg    SpO2 98%    BMI 15.19 kg/m  Physical Exam General: Awake alert, sitting on the side of her bed has just finished breakfast.  No issues at this time. Cardiac: Regular rate and rhythm around 80 bpm. Lungs: Clear lung sounds bilaterally regular respiratory rate Psych: Calm cooperative  ED Course / MDM  EKG:EKG Interpretation  Date/Time:  Thursday August 07 2020 13:17:48 EDT Ventricular Rate:  67 PR Interval:  156 QRS Duration: 70 QT Interval:  404 QTC Calculation: 426 R Axis:   57 Text Interpretation: Normal sinus rhythm Normal ECG Confirmed by UNCONFIRMED, DOCTOR (06-13-2002), editor [id number], Tammy [phone]) on 08/08/2020 9:55:16 AM  Clinical Course as of Aug 12 953  Thu Aug 07, 2020  1220 Aug 09, 2020, husband, opiate addict/dependent for decades. Last was July 25th, "took a whole month of pills in 2 days." "Hardly gotten out of bed since."  Smoking in bed, putting it out in the carpet.  "Always been against suicide." Their son has passed Apr 16, 2002. Selfish behavior.    [DS]    Clinical Course User Index [DS] 2004, MD   I have reviewed the labs performed to date as well as medications administered while in observation.  Recent changes in the last 24 hours include .  Plan  Current plan is for referral to geriatric psychiatric facility.. Patient is under full IVC at this time.   Delton Prairie, MD 08/12/20 [phone]

## 2020-08-12 NOTE — Progress Notes (Signed)
Patient ID: Janet Murphy, female   DOB: 05/26/1956, 64 y.o.   MRN: [id number]   Brief entry  Patient remains in demented or pseudodemented state   She is not coming out of room and needs OT PT input  Spoke with husband who did not want her transferred to [facility] so we are not awaiting a bed downstairs   She was supposed to discharge to [facility] just now  CBC and CMP reordered today  Neuro consult requested to help delineate issues of dementia not previously addressed   She is on COW protocol for opioids   Vitals have been stable   Rama Candise Bowens MD Psych

## 2020-08-12 NOTE — BH Assessment (Signed)
Referral Check:   [facility] ([phone]- [phone]),Sarah asked to re-send referral, task completed 12:50 08/12/20   [facility]arl[facility] Plater ([phone]),Cedrick agreed to check on status and call back   [facility] [phone]), Hope reports denied due to cognitive decline   [facility] [phone] or [phone])Amira confirmed receiving referral but reports gero unit to currently be on hold but changes daily, check back tomorrow    Sandre Kitty [phone] or [phone] reports currently under review   [name] [phone]).Left a voicemail at 12:47pm 08/12/20

## 2020-08-13 ENCOUNTER — Emergency Department: Payer: Medicare HMO

## 2020-08-13 DIAGNOSIS — M954 Acquired deformity of chest and rib: Secondary | ICD-10-CM | POA: Diagnosis not present

## 2020-08-13 DIAGNOSIS — R7611 Nonspecific reaction to tuberculin skin test without active tuberculosis: Secondary | ICD-10-CM | POA: Diagnosis not present

## 2020-08-13 LAB — SARS CORONAVIRUS 2 BY RT PCR (HOSPITAL ORDER, PERFORMED IN ~~LOC~~ HOSPITAL LAB): SARS Coronavirus 2: NEGATIVE

## 2020-08-13 MED ORDER — OLANZAPINE 2.5 MG PO TABS
2.5000 mg | ORAL_TABLET | Freq: Every day | ORAL | Status: DC
  Administered 2020-08-13 – 2020-08-14 (×2): 2.5 mg via ORAL
  Filled 2020-08-13 (×2): qty 1

## 2020-08-13 MED ORDER — CIPROFLOXACIN-DEXAMETHASONE 0.3-0.1 % OT SUSP
4.0000 [drp] | Freq: Two times a day (BID) | OTIC | Status: AC
  Administered 2020-08-13 – 2020-08-15 (×4): 4 [drp] via OTIC
  Filled 2020-08-13: qty 7.5

## 2020-08-13 NOTE — Progress Notes (Signed)
[facility] MD Progress Note  08/13/2020 11:48 AM Janet Murphy  MRN:  [id number] Subjective:   " I do not know ;'   I am okay  Principal Problem: Major depressive disorder, recurrent severe without psychotic features (HCC) Diagnosis: Principal Problem:   Major depressive disorder, recurrent severe without psychotic features (HCC) opioid dependence  Possible underlying dementia  Psych transfer pending     Total Time spent with patient:30-40 min  Past Psychiatric History:  Already noted      Past Medical History:  Past Medical History:  Diagnosis Date  . Back pain   . Shoulder pain     Past Surgical History:  Procedure Laterality Date  . ABDOMINAL HYSTERECTOMY     partial    Family History:  Family History  Problem Relation Age of Onset  . Arthritis Mother   . Stroke Mother   . Heart attack Father   . Early death Son 30       car accident    Family Psychiatric  History:  Noted previously no major history  Social History:  Social History   Substance and Sexual Activity  Alcohol Use No     Social History   Substance and Sexual Activity  Drug Use No    Social History   Socioeconomic History  . Marital status: Married    Spouse name: Not on file  . Number of children: Not on file  . Years of education: Not on file  . Highest education level: Not on file  Occupational History  . Not on file  Tobacco Use  . Smoking status: Current Every Day Smoker    Packs/day: 0.50    Types: Cigarettes  . Smokeless tobacco: Never Used  Vaping Use  . Vaping Use: Never used  Substance and Sexual Activity  . Alcohol use: No  . Drug use: No  . Sexual activity: Yes  Other Topics Concern  . Not on file  Social History Narrative  . Not on file   Social Determinants of Health   Financial Resource Strain:   . Difficulty of Paying Living Expenses: Not on file  Food Insecurity:   . Worried About Programme researcher, broadcasting/film/video in the Last Year: Not on file  . Ran Out of Food in  the Last Year: Not on file  Transportation Needs:   . Lack of Transportation (Medical): Not on file  . Lack of Transportation (Non-Medical): Not on file  Physical Activity:   . Days of Exercise per Week: Not on file  . Minutes of Exercise per Session: Not on file  Stress:   . Feeling of Stress : Not on file  Social Connections:   . Frequency of Communication with Friends and Family: Not on file  . Frequency of Social Gatherings with Friends and Family: Not on file  . Attends Religious Services: Not on file  . Active Member of Clubs or Organizations: Not on file  . Attends Banker Meetings: Not on file  . Marital Status: Not on file   Additional Social History:    Pain Medications: See PTA Prescriptions: See PTA Over the Counter: See PTA History of alcohol / drug use?: No history of alcohol / drug abuse Longest period of sobriety (when/how long): n/a      She has been in a long major depression with isolation, lack of socializing remaining in room and having sleep wake disturbance with lack of motivation, energy, concentration, and attention    She has  had some weight loss as well but appetite remains.   Meds have been adjusted and shw was supposed to go to geri bed but husband refused \  We thought geriatric family --could refuse a transfer but found out that is not so    We are trying to send her down stairs or to [facility] now   She remains pending for transfer  Asking for Neuro to help with issues of dementia vs. Pseudodementia               Sleep: erratic   Appetite:  Good reported   Current Medications: Current Facility-Administered Medications  Medication Dose Route Frequency Provider Last Rate Last Admin  . acetaminophen (TYLENOL) tablet 650 mg  650 mg Oral Q6H PRN Shaune Pollack, MD   650 mg at 08/11/20 2152  . dicyclomine (BENTYL) tablet 20 mg  20 mg Oral Q6H PRN Roselind Messier, MD      . diphenhydrAMINE (BENADRYL) 12.5 MG/5ML elixir  12.5 mg  12.5 mg Oral QHS Roselind Messier, MD   12.5 mg at 08/12/20 2258  . hydrOXYzine (ATARAX/VISTARIL) tablet 25 mg  25 mg Oral Q6H PRN Roselind Messier, MD   25 mg at 08/13/20 1044  . loperamide (IMODIUM) capsule 2-4 mg  2-4 mg Oral PRN Roselind Messier, MD      . methocarbamol (ROBAXIN) tablet 500 mg  500 mg Oral Q8H PRN Roselind Messier, MD      . naproxen (NAPROSYN) tablet 500 mg  500 mg Oral BID PRN Roselind Messier, MD      . nortriptyline (PAMELOR) capsule 20 mg  20 mg Oral QHS Roselind Messier, MD   20 mg at 08/12/20 2258  . ondansetron (ZOFRAN-ODT) disintegrating tablet 4 mg  4 mg Oral Q6H PRN Roselind Messier, MD      . thiothixene (NAVANE) capsule 2 mg  2 mg Oral QHS Roselind Messier, MD       Current Outpatient Medications  Medication Sig Dispense Refill  . docusate sodium (COLACE) 100 MG capsule Take 1 capsule (100 mg total) by mouth 2 (two) times daily. 60 capsule 2  . gabapentin (NEURONTIN) 100 MG capsule Take 100 mg by mouth 3 (three) times daily.    Marland Kitchen HYDROcodone-acetaminophen (NORCO) 10-325 MG tablet Take 1 tablet by mouth 4 (four) times daily.    . Melatonin 5 MG CAPS Take 10 mg by mouth at bedtime.      Lab Results:  Results for orders placed or performed during the hospital encounter of 08/07/20 (from the past 48 hour(s))  Urine Drug Screen, Qualitative     Status: None   Collection Time: 08/12/20  3:43 PM  Result Value Ref Range   Tricyclic, Ur Screen NONE DETECTED NONE DETECTED   Amphetamines, Ur Screen NONE DETECTED NONE DETECTED   MDMA (Ecstasy)Ur Screen NONE DETECTED NONE DETECTED   Cocaine Metabolite,Ur [place] NONE DETECTED NONE DETECTED   Opiate, Ur Screen NONE DETECTED NONE DETECTED   Phencyclidine (PCP) Ur S NONE DETECTED NONE DETECTED   Cannabinoid 50 Ng, Ur [place] NONE DETECTED NONE DETECTED   Barbiturates, Ur Screen NONE DETECTED NONE DETECTED   Benzodiazepine, Ur Scrn NONE DETECTED NONE DETECTED   Methadone Scn, Ur NONE DETECTED NONE DETECTED    Comment:  (NOTE) Tricyclics + metabolites, urine    Cutoff 1000 ng/mL Amphetamines + metabolites, urine  Cutoff 1000 ng/mL MDMA (Ecstasy), urine              Cutoff 500 ng/mL Cocaine Metabolite, urine  Cutoff 300 ng/mL Opiate + metabolites, urine        Cutoff 300 ng/mL Phencyclidine (PCP), urine         Cutoff 25 ng/mL Cannabinoid, urine                 Cutoff 50 ng/mL Barbiturates + metabolites, urine  Cutoff 200 ng/mL Benzodiazepine, urine              Cutoff 200 ng/mL Methadone, urine                   Cutoff 300 ng/mL  The urine drug screen provides only a preliminary, unconfirmed analytical test result and should not be used for non-medical purposes. Clinical consideration and professional judgment should be applied to any positive drug screen result due to possible interfering substances. A more specific alternate chemical method must be used in order to obtain a confirmed analytical result. Gas chromatography / mass spectrometry (GC/MS) is the preferred confirm atory method. Performed at [facility], [address] [place], [place] [address]   Urinalysis, Complete w Microscopic     Status: Abnormal   Collection Time: 08/12/20  3:43 PM  Result Value Ref Range   Color, Urine STRAW (A) YELLOW   APPearance CLEAR (A) CLEAR   Specific Gravity, Urine 1.011 1.005 - 1.030   pH 5.0 5.0 - 8.0   Glucose, UA NEGATIVE NEGATIVE mg/dL   Hgb urine dipstick NEGATIVE NEGATIVE   Bilirubin Urine NEGATIVE NEGATIVE   Ketones, ur NEGATIVE NEGATIVE mg/dL   Protein, ur NEGATIVE NEGATIVE mg/dL   Nitrite NEGATIVE NEGATIVE   Leukocytes,Ua TRACE (A) NEGATIVE   RBC / HPF 0-5 0 - 5 RBC/hpf   WBC, UA 0-5 0 - 5 WBC/hpf   Bacteria, UA RARE (A) NONE SEEN   Squamous Epithelial / LPF 0-5 0 - 5    Comment: Performed at [facility], [address] [place], [place] [address]  CBC with Differential/Platelet     Status: Abnormal   Collection Time: 08/12/20  6:37 PM  Result Value Ref  Range   WBC 7.1 4.0 - 10.5 K/uL   RBC 3.76 (L) 3.87 - 5.11 MIL/uL   Hemoglobin 13.2 12.0 - 15.0 g/dL   HCT 09.839.3 36 - 46 %   MCV 104.5 (H) 80.0 - 100.0 fL   MCH 35.1 (H) 26.0 - 34.0 pg   MCHC 33.6 30.0 - 36.0 g/dL   RDW 11.914.4 14.711.5 - 82.915.5 %   Platelets 399 150 - 400 K/uL   nRBC 0.0 0.0 - 0.2 %   Neutrophils Relative % 43 %   Neutro Abs 3.0 1.7 - 7.7 K/uL   Lymphocytes Relative 46 %   Lymphs Abs 3.3 0.7 - 4.0 K/uL   Monocytes Relative 9 %   Monocytes Absolute 0.6 0 - 1 K/uL   Eosinophils Relative 1 %   Eosinophils Absolute 0.1 0 - 0 K/uL   Basophils Relative 1 %   Basophils Absolute 0.1 0 - 0 K/uL   Immature Granulocytes 0 %   Abs Immature Granulocytes 0.02 0.00 - 0.07 K/uL    Comment: Performed at [facility], [address] [place], [place] [address]  Comprehensive metabolic panel     Status: None   Collection Time: 08/12/20  6:37 PM  Result Value Ref Range   Sodium 139 135 - 145 mmol/L   Potassium 4.7 3.5 - 5.1 mmol/L   Chloride 103 98 - 111 mmol/L   CO2 27  22 - 32 mmol/L   Glucose, Bld 99 70 - 99 mg/dL    Comment: Glucose reference range applies only to samples taken after fasting for at least 8 hours.   BUN 19 8 - 23 mg/dL   Creatinine, Ser 1.94 0.44 - 1.00 mg/dL   Calcium 9.0 8.9 - 17.4 mg/dL   Total Protein 7.8 6.5 - 8.1 g/dL   Albumin 4.2 3.5 - 5.0 g/dL   AST 16 15 - 41 U/L   ALT 17 0 - 44 U/L   Alkaline Phosphatase 79 38 - 126 U/L   Total Bilirubin 0.4 0.3 - 1.2 mg/dL   GFR calc non Af Amer >60 >60 mL/min   GFR calc Af Amer >60 >60 mL/min   Anion gap 9 5 - 15    Comment: Performed at [facility], [address] [place], [place] [address]    Blood Alcohol level:  Lab Results  Component Value Date   ETH <10 08/07/2020    Metabolic Disorder Labs: Lab Results  Component Value Date   HGBA1C 5.3 07/10/2012   No results found for: PROLACTIN No results found for: CHOL, TRIG, HDL, CHOLHDL, VLDL, LDLCALC  Physical Findings: AIMS:  , ,   ,  ,    CIWA:    COWS:     Musculoskeletal: Strength & Muscle Tone: thinning needs OT PT for eval  Gait & Station: seems independent  Patient leans: n/a    Handedness --not knonw Cognition impaired ADL's  Okay when redirected Recall poor Cooperation poor Akathisia none Language not clear Assets caring husband and home Aims not done yet     Psychiatric Specialty Exam: Physical Exam  Review of Systems  Blood pressure 139/80, pulse 73, temperature 98 F (36.7 C), temperature source Oral, resp. rate 17, height 5\' 7"  (1.702 m), weight 44 kg, SpO2 99 %.Body mass index is 15.19 kg/m.     Patient is strange and odd, keeps door closed with lights off No sign of opiate withdrawal symptoms Oriented times one Many I do not know answers or I am okay over and over again Consciousness not clouded or fluctuant Concentration and attention poor Judgement insight reliability poor Intelligence and fund of knowledge declining No shakes tics tremors SI and HI ---not clear she does not answer Speech limited Eye contact and rapport poor Abstraction poor Thought process and content not well k nown she does not actually answer                                                                Treatment Plan Summary:  Zyprexa also added at hs for sleep and mood   Awaits bed transfer ----  Neuro consult pending         , MD 08/13/2020, 11:48 AM

## 2020-08-13 NOTE — ED Notes (Signed)
Hourly rounding reveals patient in room. No complaints, stable, in no acute distress. Q15 minute rounds and monitoring via Security Cameras to continue. 

## 2020-08-13 NOTE — ED Notes (Signed)
Report to include Situation, Background, Assessment, and Recommendations received from Amy RN. Patient alert and oriented, warm and dry, in no acute distress. Patient denies SI, HI, AVH and pain. Patient made aware of Q15 minute rounds and security cameras for their safety. Patient instructed to come to me with needs or concerns.  

## 2020-08-13 NOTE — Progress Notes (Signed)
PT Cancellation Note  Patient Details Name: Janet Murphy MRN: [id number] DOB: 12/30/1955   Cancelled Treatment:    Reason Eval/Treat Not Completed:  (Consult received and chart reviewed.  Patient persisently refusing participation with therapist; unable to encouage, redirect despite efforts. Patient unable to appropriately answer questions or engage in meaningful conversation with therapist; generally insistent that "I'm good" and "needing to rest".)   Declan Adamson H. Manson Passey, PT, DPT, NCS 08/13/20, 9:44 PM [phone]

## 2020-08-13 NOTE — ED Notes (Signed)
Snack and beverage given. 

## 2020-08-13 NOTE — BH Assessment (Signed)
Writer received call from patient's husband ([name]), stating he was returning someone's phone call from earlier today. Writer updated him about the patient getting accepted to [facility] and will possibly transfer to their facility tomorrow (08/14/2020). He voiced his understanding and had no further questions.

## 2020-08-13 NOTE — BH Assessment (Addendum)
6:48 PM Rhandie called to report that the patient's IVC expires today 08-13-20. The IVC will need to be renewed and faxed to [phone] before pt transports in the morning.

## 2020-08-13 NOTE — BH Assessment (Addendum)
Patient has been accepted to [facility]  Patient assigned to room 418 Accepting physician is Dr. Seth Bake.  Call report to [phone].  Representative was Rhandie.   ER Staff is aware of it:  Drinda Butts, ER Secretary  Dr. Erma Heritage, ER MD  Amy Patient's Nurse      6:02 PM-Patient's Family/Support System (Spouse Ermelinda Das [phone]) was contacted for updates however there was no answer. This Clinical research associate left a [employer].

## 2020-08-13 NOTE — BH Assessment (Signed)
   Janet Murphy [phone])8:45 am-Patient currently under review. Per request pt's consult notes and MAR were faxed.    -2:45 Per representative pt needs updated covid results and EKG. ED staff have been notified. This Clinical research associate agreed to fax requested information when it results.    4:18 pm-Patient was denied due to patient's primary dx dementia.

## 2020-08-13 NOTE — Progress Notes (Addendum)
OT Cancellation Note  Patient Details Name: Janet Murphy MRN: [id number] DOB: 1956/03/30   Cancelled Treatment:    Reason Eval/Treat Not Completed: OT screened, no needs identified, will sign off;Other (comment). Thank you for the OT consult. Order received and chart reviewed. OT attempted to see pt in her room on ED BHU, however, pt remains in bed with blankets pulled over face t/o conversation. She states repeatedly that she "has not slept in 3 days" and asks this author repeatedly to "tell that girl I need a shot". Pt perseverates on sleeping issues and does no engage with OT attempts at intervention. Pt and provider briefly discuss strategies to support sleep including engaging in daily routines, maintaining a regular sleep wake cycle, and progressive muscle relaxation. Pt declines to participate in further ADL management/discussion. Does not answer background questions about home living, PLOF, etc. Ultimately asks this author to "just leave and let me sleep". Per nursing staff on unit, pt is independent with functional mobility and does not require physical assist or AE when ambulating to the bathroom or on the unit. When pt desires, she is able to complete self care tasks independently. No skilled acute OT identified needs at this time. Will sign off. Please re-consult if additional OT needs arise. Recommend pt DC with 24/7 supervision for safety. If placed in long-term psychiatric care, recommend OT services at this location to support pt engagement in meaningful occupations of daily life, self care skills and community re-entry.   Rockney Ghee, M.S., OTR/L Ascom: [phone] 08/13/20, 9:32 AM

## 2020-08-13 NOTE — BH Assessment (Signed)
·   [facility] [phone] or [phone] am-Patient currently under review. Per request pt's chest xray and updated labs were faxed.   -14:30 Per Rhandie pt needs updated covid results and EKG. ED staff have been notified. This Clinical research associate agreed to fax requested information when it results. Task completed at 17:12pm.

## 2020-08-13 NOTE — BH Assessment (Signed)
Updated disposition 7:10 PM: Pt's acceptance at [facility] has been placed on hold due to a recent event at the facility. Patient is still approved, however acceptance has been delayed.

## 2020-08-13 NOTE — BH Assessment (Signed)
   [facility] [phone] or [phone] am-Patient currently under review. Per request pt's chest xray andupdated labs were faxed.

## 2020-08-14 NOTE — BH Assessment (Signed)
Referral Check:   [facility] ([phone]- [phone]),-11:29am Per Maralyn Sago there are currently no beds availiable.   [facility] [phone]---[phone]), -11:31 am: No answer   [facility] [phone] or [phone]) Rowan Blase asked for a referral refax. Task completed at 8:42am.  -11:27am The referral is still under review.   -3:38 PM Per Mathis Fare. Pt denied due to substance use disorder  [facility] [phone] or [phone])  -8:20 AM Per Lynden Ang, acceptance delayed. Call back for follow up on 08/15/20 requested.    [name] [phone]). Left a voicemail at 12:47pm 08/12/20

## 2020-08-14 NOTE — ED Notes (Signed)
Hourly rounding reveals patient in room. No complaints, stable, in no acute distress. Q15 minute rounds and monitoring via Security Cameras to continue. 

## 2020-08-14 NOTE — BH Assessment (Signed)
Updated disposition 8:22 AM: Pt's acceptance at [facility] REMAINS on hold due to a pt testing positive at their facility. Patient is still approved, however acceptance has been delayed. Lynden Ang advised that TTS call back tomorrow 08/15/20 for updates.

## 2020-08-14 NOTE — ED Notes (Signed)
PT  PLACED  UNDER  2ND SET OF IVC  PAPERS  PENDING  PLACEMENT 

## 2020-08-14 NOTE — BH Assessment (Signed)
9:25 AM Spoke with patient's husband [phone]), to update him about the patient's lack of transfer to [facility]. This writer assured him that other placement options are being pursued. He voiced his understanding and had no further questions.

## 2020-08-14 NOTE — ED Notes (Signed)
Meal tray given 

## 2020-08-14 NOTE — ED Notes (Signed)
Pt given meal tray.

## 2020-08-14 NOTE — ED Provider Notes (Signed)
Emergency Medicine Observation Re-evaluation Note  Janet Murphy is a 64 y.o. female, seen on rounds today.  Pt initially presented to the ED for complaints of IVC Currently, the patient is resting, no complaints.  Physical Exam  BP (!) 140/108 (BP Location: Right Arm)   Pulse 77   Temp 98.6 F (37 C) (Oral)   Resp 18   Ht 5\' 7"  (1.702 m)   Wt 44 kg   SpO2 99%   BMI 15.19 kg/m  Physical Exam General: resting Cardiac: well perfused  Lungs: respirations even and unlabored Psych: odd, calm and cooperative  ED Course / MDM  EKG:EKG Interpretation  Date/Time:  Thursday August 07 2020 13:17:48 EDT Ventricular Rate:  67 PR Interval:  156 QRS Duration: 70 QT Interval:  404 QTC Calculation: 426 R Axis:   57 Text Interpretation: Normal sinus rhythm Normal ECG Confirmed by UNCONFIRMED, DOCTOR (06-13-2002), editor [id number], Tammy [phone]) on 08/08/2020 9:55:16 AM  Clinical Course as of Aug 14 714  Thu Aug 07, 2020  04-07-1219 1221, husband, opiate addict/dependent for decades. Last was July 25th, "took a whole month of pills in 2 days." "Hardly gotten out of bed since."  Smoking in bed, putting it out in the carpet.  "Always been against suicide." Their son has passed 04/06/02. Selfish behavior.    [DS]    Clinical Course User Index [DS] 2004, MD   I have reviewed the labs performed to date as well as medications administered while in observation.  Recent changes in the last 24 hours include none.  Plan  Current plan is for psych disp. Patient is under full IVC at this time.   Delton Prairie, MD 08/14/20 [phone]

## 2020-08-15 MED ORDER — NORTRIPTYLINE HCL 10 MG PO CAPS
30.0000 mg | ORAL_CAPSULE | Freq: Every day | ORAL | Status: DC
  Administered 2020-08-15: 30 mg via ORAL
  Filled 2020-08-15 (×2): qty 3

## 2020-08-15 MED ORDER — DOCUSATE SODIUM 100 MG PO CAPS
100.0000 mg | ORAL_CAPSULE | Freq: Two times a day (BID) | ORAL | Status: DC
  Administered 2020-08-15 – 2020-08-16 (×3): 100 mg via ORAL
  Filled 2020-08-15 (×3): qty 1

## 2020-08-15 MED ORDER — DIPHENHYDRAMINE HCL 12.5 MG/5ML PO ELIX
25.0000 mg | ORAL_SOLUTION | Freq: Every day | ORAL | Status: DC
  Administered 2020-08-15: 25 mg via ORAL
  Filled 2020-08-15 (×2): qty 10

## 2020-08-15 MED ORDER — GABAPENTIN 100 MG PO CAPS
100.0000 mg | ORAL_CAPSULE | Freq: Three times a day (TID) | ORAL | Status: DC
  Administered 2020-08-15 – 2020-08-16 (×4): 100 mg via ORAL
  Filled 2020-08-15 (×4): qty 1

## 2020-08-15 MED ORDER — MELATONIN 5 MG PO TABS
10.0000 mg | ORAL_TABLET | Freq: Every day | ORAL | Status: DC
  Administered 2020-08-15: 10 mg via ORAL
  Filled 2020-08-15: qty 2

## 2020-08-15 MED ORDER — OLANZAPINE 7.5 MG PO TABS
4.0000 mg | ORAL_TABLET | Freq: Every day | ORAL | Status: DC
  Administered 2020-08-15: 3.75 mg via ORAL
  Filled 2020-08-15: qty 2
  Filled 2020-08-15 (×2): qty 0.5

## 2020-08-15 NOTE — ED Notes (Signed)
Pt was up, fluid was provided.

## 2020-08-15 NOTE — BH Assessment (Signed)
Writer called and spoke with [facility] ([place]-[phone]), pient no longer have a bed with them. They had to close the unit because of a COVID outbreak. Writer updated Psych MD (Dr. Smith Robert).

## 2020-08-15 NOTE — ED Notes (Signed)
IVC/  PENDING  PLACEMENT 

## 2020-08-15 NOTE — ED Notes (Signed)
Hourly rounding reveals patient in room. No complaints, stable, in no acute distress. Q15 minute rounds and monitoring via Security Cameras to continue. 

## 2020-08-15 NOTE — Progress Notes (Signed)
PT Cancellation Note  Patient Details Name: Janet Murphy MRN: [id number] DOB: [birth year]   Cancelled Treatment:    Reason Eval/Treat Not Completed: PT screened, no needs identified, will sign off (Consult received and evaluation re-attempted.  Upon arrival to unit, patient sitting in common area.  Per primary RN report "has been walking all over"; appears safe, steady and without safety concern. No skilled PT needs identified.  Will complete order at this time; please re-consult should needs change.)  Leone Mobley H. Manson Passey, PT, DPT, NCS 08/15/20, 10:32 AM [phone]

## 2020-08-15 NOTE — ED Notes (Signed)
Took trash out of PT room and swept floor.

## 2020-08-15 NOTE — BH Assessment (Signed)
Writer spoke with the patient to complete an updated/reassessment. Patient continue to be psychotic and have disorganized thoughts.

## 2020-08-15 NOTE — ED Notes (Signed)
Pt give a cup of apple juice.

## 2020-08-15 NOTE — ED Notes (Signed)
Pt received graham crackers and ginger ale for night time snack. Pt calm and resting at this time.   lw edt

## 2020-08-15 NOTE — ED Notes (Signed)
Pt given a cup of ice water

## 2020-08-15 NOTE — ED Notes (Signed)
Pt to nurse's station multiple times asking for juice and crackers.

## 2020-08-15 NOTE — ED Provider Notes (Signed)
Emergency Medicine Observation Re-evaluation Note  Janet Murphy is a 64 y.o. female  Physical Exam  BP 133/70 (BP Location: Right Arm)   Pulse 90   Temp 98.6 F (37 C) (Oral)   Resp 14   Ht 5\' 7"  (1.702 m)   Wt 44 kg   SpO2 96%   BMI 15.19 kg/m  Physical Exam Constitutional: Patient resting comfortably Respiratory: Patient in no respiratory distress no retractions seen Psych: Patient calm not combative  ED Course / MDM  EKG:EKG Interpretation  Date/Time:  Thursday August 07 2020 13:17:48 EDT Ventricular Rate:  67 PR Interval:  156 QRS Duration: 70 QT Interval:  404 QTC Calculation: 426 R Axis:   57 Text Interpretation: Normal sinus rhythm Normal ECG Confirmed by UNCONFIRMED, DOCTOR (06-13-2002), editor [id number], Tammy [phone]) on 08/08/2020 9:55:16 AM  Clinical Course as of Aug 16 743  Thu Aug 07, 2020  1220 Aug 09, 2020, husband, opiate addict/dependent for decades. Last was July 25th, "took a whole month of pills in 2 days." "Hardly gotten out of bed since."  Smoking in bed, putting it out in the carpet.  "Always been against suicide." Their son has passed 2002-04-01. Selfish behavior.    [DS]    Clinical Course User Index [DS] 2004, MD     Plan  Disposition per psychiatry.  Home medications have been reordered except for her pain medicines that she has not asked for any.   Delton Prairie, MD 08/15/20 [phone]

## 2020-08-16 DIAGNOSIS — F332 Major depressive disorder, recurrent severe without psychotic features: Secondary | ICD-10-CM | POA: Diagnosis not present

## 2020-08-16 MED ORDER — CIPROFLOXACIN-DEXAMETHASONE 0.3-0.1 % OT SUSP
4.0000 [drp] | Freq: Two times a day (BID) | OTIC | 0 refills | Status: DC
Start: 2020-08-16 — End: 2020-08-16

## 2020-08-16 MED ORDER — HYDROXYZINE HCL 25 MG PO TABS
25.0000 mg | ORAL_TABLET | Freq: Four times a day (QID) | ORAL | 0 refills | Status: DC | PRN
Start: 2020-08-16 — End: 2020-08-16

## 2020-08-16 MED ORDER — OLANZAPINE 5 MG PO TABS: 5.0000 mg | ORAL_TABLET | Freq: Every day | ORAL | 0 refills | Status: DC

## 2020-08-16 MED ORDER — METHOCARBAMOL 500 MG PO TABS: 500.0000 mg | ORAL_TABLET | Freq: Three times a day (TID) | ORAL | 0 refills | Status: DC | PRN

## 2020-08-16 MED ORDER — OLANZAPINE 5 MG PO TABS: 5.0000 mg | ORAL_TABLET | Freq: Every day | ORAL | 1 refills | Status: DC

## 2020-08-16 MED ORDER — CIPROFLOXACIN-DEXAMETHASONE 0.3-0.1 % OT SUSP
4.0000 [drp] | Freq: Two times a day (BID) | OTIC | 0 refills | Status: AC
Start: 2020-08-16 — End: 2020-08-21

## 2020-08-16 MED ORDER — GABAPENTIN 100 MG PO CAPS: 100.0000 mg | ORAL_CAPSULE | Freq: Three times a day (TID) | ORAL | 0 refills | Status: DC

## 2020-08-16 MED ORDER — OLANZAPINE 5 MG PO TABS: 5.0000 mg | ORAL_TABLET | Freq: Every day | ORAL | Status: DC

## 2020-08-16 MED ORDER — NORTRIPTYLINE HCL 10 MG PO CAPS: 30.0000 mg | ORAL_CAPSULE | Freq: Every day | ORAL | 1 refills | Status: DC

## 2020-08-16 MED ORDER — NORTRIPTYLINE HCL 10 MG PO CAPS
30.0000 mg | ORAL_CAPSULE | Freq: Every day | ORAL | 0 refills | Status: DC
Start: 2020-08-16 — End: 2020-08-16

## 2020-08-16 MED ORDER — HYDROXYZINE HCL 25 MG PO TABS: 25.0000 mg | ORAL_TABLET | Freq: Four times a day (QID) | ORAL | 1 refills | Status: DC | PRN

## 2020-08-16 MED ORDER — METHOCARBAMOL 500 MG PO TABS: 500.0000 mg | ORAL_TABLET | Freq: Three times a day (TID) | ORAL | 1 refills | Status: DC | PRN

## 2020-08-16 NOTE — ED Provider Notes (Signed)
Emergency Medicine Observation Re-evaluation Note  Janet Murphy is a 64 y.o. female, seen on rounds today.  Pt initially presented to the ED for complaints of IVC Currently, the patient is resting.  Physical Exam  BP 140/77 (BP Location: Left Arm)   Pulse 83   Temp 98.4 F (36.9 C) (Oral)   Resp 18   Ht 5\' 7"  (1.702 m)   Wt 44 kg   SpO2 94%   BMI 15.19 kg/m  Physical Exam Constitutional:      Appearance: She is not ill-appearing or toxic-appearing.  Eyes:     Extraocular Movements: Extraocular movements intact.     Pupils: Pupils are equal, round, and reactive to light.  Cardiovascular:     Rate and Rhythm: Normal rate.  Pulmonary:     Effort: Pulmonary effort is normal.  Abdominal:     General: There is no distension.  Skin:    General: Skin is warm and dry.  Neurological:     General: No focal deficit present.     Cranial Nerves: No cranial nerve deficit.      ED Course / MDM  EKG:EKG Interpretation  Date/Time:  Wednesday August 13 2020 14:29:29 EDT Ventricular Rate:  77 PR Interval:  132 QRS Duration: 74 QT Interval:  366 QTC Calculation: 414 R Axis:   58 Text Interpretation: Normal sinus rhythm Normal ECG ----------unconfirmed---------- Confirmed by UNCONFIRMED, DOCTOR (10-22-1972), editor [id number] [phone]) on 08/15/2020 9:40:00 AM  Clinical Course as of Aug 16 736  Thu Aug 07, 2020  1220 Aug 09, 2020, husband, opiate addict/dependent for decades. Last was July 25th, "took a whole month of pills in 2 days." "Hardly gotten out of bed since."  Smoking in bed, putting it out in the carpet.  "Always been against suicide." Their son has passed 04/18/2002. Selfish behavior.    [DS]    Clinical Course User Index [DS] 2004, MD   I have reviewed the labs performed to date as well as medications administered while in observation.  Recent changes in the last 24 hours include continued bed search.  Plan  Current plan is for psychiatric  disposition. Patient is under full IVC at this time.   Delton Prairie, MD 08/16/20 [phone]

## 2020-08-16 NOTE — Consult Note (Signed)
[facility] Psych ED Discharge  08/16/2020 12:48 PM Janet Murphy  MRN:  [id number] Principal Problem: Major depressive disorder, recurrent severe without psychotic features [facility]) Discharge Diagnoses: Principal Problem:   Major depressive disorder, recurrent severe without psychotic features (HCC)  Subjective: "I'm fine."  Patient seen and evaluated in person by this provider and Janet Murphy, Janet Murphy.  She is ambulating in the ED without issues.  Continues to have memory issues.  No withdrawal symptoms, no suicidal/homicidal ideations, hallucinations, paranoia, or other concerning psychiatric symptoms.  Encourage neurology consult for any memory concerns to rule out dementia.  Psychiatrically stable for discharge.  HPI per [facility] Murphy, Janet Murphy: Janet Murphy is an 64 y.o. female who presents to the ER, under IVC, initiated by her husband. Per the report of the husband Janet Murphy), the patient mental and emotional state has "gone down" within the last several years. However, the past several weeks it has increasingly worsened. "All she does now is sleep and eat." He believes her two sister's recent diagnosis of cancer triggered the depression. She had two sisters to passed from cancer, approximately five years ago. The patient has started smoking in the home and today (08/07/2020), she was smoking while in the bed and put the cigarette out in the carpet. Husband became concerned because he was unsure if she was trying to burn the house down. He also voiced his concern about her smoking in the home, because she's never done it because has "bad asthma." The husband further explains, the patient has history of opioid abuse disorder. She has been known to take more medications then prescribed. Which results with her not having any for several days before the refill is due. He states, she recently ingested a full bottle of medications within two days.  Writer attempted to engage with the  patient but she was unable to participate. She was able to provide her name and date of birth, but all remaining answers to the questions didn't not apply. She would mumble things and then say she was okay. Thus, the information for this consult was provided by the patient's husband.  Total Time spent with patient: 30 minutes  Past Psychiatric History: memory issues  Past Medical History:  Past Medical History:  Diagnosis Date  . Back pain   . Shoulder pain     Past Surgical History:  Procedure Laterality Date  . ABDOMINAL HYSTERECTOMY     partial    Family History:  Family History  Problem Relation Age of Onset  . Arthritis Mother   . Stroke Mother   . Heart attack Father   . Early death Son 75       car accident    Family Psychiatric  History: none Social History:  Social History   Substance and Sexual Activity  Alcohol Use No     Social History   Substance and Sexual Activity  Drug Use No    Social History   Socioeconomic History  . Marital status: Married    Spouse name: Not on file  . Number of children: Not on file  . Years of education: Not on file  . Highest education level: Not on file  Occupational History  . Not on file  Tobacco Use  . Smoking status: Current Every Day Smoker    Packs/day: 0.50    Types: Cigarettes  . Smokeless tobacco: Never Used  Vaping Use  . Vaping Use: Never used  Substance and Sexual Activity  . Alcohol use:  No  . Drug use: No  . Sexual activity: Yes  Other Topics Concern  . Not on file  Social History Narrative  . Not on file   Social Determinants of Health   Financial Resource Strain:   . Difficulty of Paying Living Expenses: Not on file  Food Insecurity:   . Worried About Programme researcher, broadcasting/film/video in the Last Year: Not on file  . Ran Out of Food in the Last Year: Not on file  Transportation Needs:   . Lack of Transportation (Medical): Not on file  . Lack of Transportation (Non-Medical): Not on file  Physical  Activity:   . Days of Exercise per Week: Not on file  . Minutes of Exercise per Session: Not on file  Stress:   . Feeling of Stress : Not on file  Social Connections:   . Frequency of Communication with Friends and Family: Not on file  . Frequency of Social Gatherings with Friends and Family: Not on file  . Attends Religious Services: Not on file  . Active Member of Clubs or Organizations: Not on file  . Attends Banker Meetings: Not on file  . Marital Status: Not on file    Has this patient used any form of tobacco in the last 30 days? (Cigarettes, Smokeless Tobacco, Cigars, and/or Pipes) A prescription for an FDA-approved tobacco cessation medication was offered at discharge and the patient refused  Current Medications: Current Facility-Administered Medications  Medication Dose Route Frequency Provider Last Rate Last Admin  . acetaminophen (TYLENOL) tablet 650 mg  650 mg Oral Q6H PRN Shaune Pollack, MD   650 mg at 08/11/20 2152  . diphenhydrAMINE (BENADRYL) 12.5 MG/5ML elixir 25 mg  25 mg Oral QHS Roselind Messier, MD   25 mg at 08/15/20 2201  . docusate sodium (COLACE) capsule 100 mg  100 mg Oral BID Arnaldo Natal, MD   100 mg at 08/16/20 1048  . gabapentin (NEURONTIN) capsule 100 mg  100 mg Oral TID Arnaldo Natal, MD   100 mg at 08/16/20 1048  . melatonin tablet 10 mg  10 mg Oral QHS Arnaldo Natal, MD   10 mg at 08/15/20 2158  . nortriptyline (PAMELOR) capsule 30 mg  30 mg Oral QHS Roselind Messier, MD   30 mg at 08/15/20 2203  . OLANZapine (ZYPREXA) tablet 3.75 mg  3.75 mg Oral QHS Roselind Messier, MD   3.75 mg at 08/15/20 2159   Current Outpatient Medications  Medication Sig Dispense Refill  . docusate sodium (COLACE) 100 MG capsule Take 1 capsule (100 mg total) by mouth 2 (two) times daily. 60 capsule 2  . gabapentin (NEURONTIN) 100 MG capsule Take 100 mg by mouth 3 (three) times daily.    Marland Kitchen HYDROcodone-acetaminophen (NORCO) 10-325 MG tablet Take 1 tablet by  mouth 4 (four) times daily.    . Melatonin 5 MG CAPS Take 10 mg by mouth at bedtime.     PTA Medications: (Not in a hospital admission)   Musculoskeletal: Strength & Muscle Tone: within normal limits Gait & Station: normal Patient leans: N/A  Psychiatric Specialty Exam: Physical Exam Vitals and nursing note reviewed.  Constitutional:      Appearance: Normal appearance.  HENT:     Head: Normocephalic.     Nose: Nose normal.  Pulmonary:     Effort: Pulmonary effort is normal.  Musculoskeletal:     Cervical back: Normal range of motion.  Neurological:     General: No focal  deficit present.     Mental Status: She is alert and oriented to person, place, and time.  Psychiatric:        Attention and Perception: She is inattentive.        Mood and Affect: Mood normal. Affect is blunt.        Speech: Speech normal.     Review of Systems  Blood pressure 134/71, pulse 72, temperature 98.6 F (37 C), temperature source Oral, resp. rate 18, height 5\' 7"  (1.702 m), weight 44 kg, SpO2 97 %.Body mass index is 15.19 kg/m.  General Appearance: Casual  Eye Contact:  Fair  Speech:  Normal Rate  Volume:  Normal  Mood:  Euthymic  Affect:  Blunt  Thought Process:  Coherent  Orientation:  Full (Time, Place, and Person)  Thought Content:  Logical  Suicidal Thoughts:  No  Homicidal Thoughts:  No  Memory:  Immediate;   Fair Recent;   Poor Remote;   Fair  Judgement:  Fair  Insight:  Fair  Psychomotor Activity:  Normal  Concentration:  Concentration: Fair and Attention Span: Fair  Recall:  of Knowledge:  Fair  Language:  Fair  Akathisia:  No  Handed:  Right  AIMS (if indicated):     Assets:  Housing Leisure Time Resilience Social Support  ADL's:  Impaired  Cognition:  Impaired,  Mild  Sleep:        Demographic Factors:  Age 79 or older and Caucasian  Loss Factors: NA  Historical Factors: NA  Risk Reduction Factors:   Sense of responsibility to family,  Living with another person, especially a relative and Positive social support  Continued Clinical Symptoms:  None  Cognitive Features That Contribute To Risk:  None    Suicide Risk:  Minimal: No identifiable suicidal ideation.  Patients presenting with no risk factors but with morbid ruminations; may be classified as minimal risk based on the severity of the depressive symptoms   Plan Of Care/Follow-up recommendations:  Major depressive disorder, recurrent severe without psychotic features: Increased Zyprexa 3.75 mg daily at bedtime to 5 mg daily at bedtime -Follow up with outpatient provider  Insomnia: -Continue Pamelor 30 mg at bedtime  Anxiety and opiate use disorder: -Continue gabapentin 100 mg TID Activity:  as tolerated Diet:  heart healthy diet  Disposition: discharge home 75, NP 08/16/2020, 12:48 PM

## 2020-08-16 NOTE — ED Notes (Signed)
Breakfast tray placed in rm, bed linen was changed.

## 2020-08-16 NOTE — Discharge Instructions (Signed)
Follow up with out patient provider

## 2020-08-16 NOTE — ED Notes (Signed)
Pt given supplies to shower 

## 2020-08-16 NOTE — ED Notes (Signed)
Pt given lunch tray and a cup of sprite.  

## 2020-08-16 NOTE — ED Notes (Signed)
She has been pacing in the dayroom since my arrival - she is requesting soap  Pt reassured that I will get her shower items after report is completed

## 2020-08-16 NOTE — BH Assessment (Addendum)
Late Entry- Writer spoke with the patient to complete an updated/reassessment.   Writer and Psych NP Asher Muir L.), spoke with the patient's husband [name]) via phone. Updated him about patient progress and plan to discharge. Husband was in agreement with the plan.

## 2020-08-19 DIAGNOSIS — E559 Vitamin D deficiency, unspecified: Secondary | ICD-10-CM | POA: Diagnosis not present

## 2020-08-19 DIAGNOSIS — Z634 Disappearance and death of family member: Secondary | ICD-10-CM | POA: Diagnosis not present

## 2020-08-19 DIAGNOSIS — Z20822 Contact with and (suspected) exposure to covid-19: Secondary | ICD-10-CM | POA: Diagnosis not present

## 2020-08-19 DIAGNOSIS — F05 Delirium due to known physiological condition: Secondary | ICD-10-CM | POA: Diagnosis not present

## 2020-08-19 DIAGNOSIS — F1190 Opioid use, unspecified, uncomplicated: Secondary | ICD-10-CM | POA: Diagnosis not present

## 2020-08-19 DIAGNOSIS — R419 Unspecified symptoms and signs involving cognitive functions and awareness: Secondary | ICD-10-CM | POA: Diagnosis not present

## 2020-08-19 DIAGNOSIS — G4700 Insomnia, unspecified: Secondary | ICD-10-CM | POA: Diagnosis not present

## 2020-08-19 DIAGNOSIS — Z87891 Personal history of nicotine dependence: Secondary | ICD-10-CM | POA: Diagnosis not present

## 2020-08-19 DIAGNOSIS — F332 Major depressive disorder, recurrent severe without psychotic features: Secondary | ICD-10-CM | POA: Diagnosis not present

## 2020-08-19 DIAGNOSIS — F0280 Dementia in other diseases classified elsewhere without behavioral disturbance: Secondary | ICD-10-CM | POA: Diagnosis not present

## 2020-08-19 DIAGNOSIS — F329 Major depressive disorder, single episode, unspecified: Secondary | ICD-10-CM | POA: Diagnosis not present

## 2020-08-19 DIAGNOSIS — H9203 Otalgia, bilateral: Secondary | ICD-10-CM | POA: Diagnosis not present

## 2020-08-19 DIAGNOSIS — Z681 Body mass index (BMI) 19 or less, adult: Secondary | ICD-10-CM | POA: Diagnosis not present

## 2020-08-19 DIAGNOSIS — F0150 Vascular dementia without behavioral disturbance: Secondary | ICD-10-CM | POA: Diagnosis not present

## 2020-08-19 DIAGNOSIS — F0390 Unspecified dementia without behavioral disturbance: Secondary | ICD-10-CM | POA: Diagnosis not present

## 2020-08-19 DIAGNOSIS — Z720 Tobacco use: Secondary | ICD-10-CM | POA: Diagnosis not present

## 2020-08-19 DIAGNOSIS — E46 Unspecified protein-calorie malnutrition: Secondary | ICD-10-CM | POA: Diagnosis not present

## 2020-08-19 DIAGNOSIS — E538 Deficiency of other specified B group vitamins: Secondary | ICD-10-CM | POA: Diagnosis not present

## 2020-08-19 DIAGNOSIS — F0281 Dementia in other diseases classified elsewhere with behavioral disturbance: Secondary | ICD-10-CM | POA: Diagnosis not present

## 2020-08-19 DIAGNOSIS — N390 Urinary tract infection, site not specified: Secondary | ICD-10-CM | POA: Diagnosis not present

## 2020-08-26 DIAGNOSIS — F1190 Opioid use, unspecified, uncomplicated: Secondary | ICD-10-CM | POA: Diagnosis not present

## 2020-08-26 DIAGNOSIS — Z720 Tobacco use: Secondary | ICD-10-CM | POA: Diagnosis not present

## 2020-08-26 DIAGNOSIS — E559 Vitamin D deficiency, unspecified: Secondary | ICD-10-CM | POA: Diagnosis not present

## 2020-08-26 DIAGNOSIS — F05 Delirium due to known physiological condition: Secondary | ICD-10-CM | POA: Diagnosis not present

## 2020-08-26 DIAGNOSIS — F0280 Dementia in other diseases classified elsewhere without behavioral disturbance: Secondary | ICD-10-CM | POA: Diagnosis not present

## 2020-08-27 DIAGNOSIS — F0150 Vascular dementia without behavioral disturbance: Secondary | ICD-10-CM | POA: Diagnosis not present

## 2020-08-27 DIAGNOSIS — F05 Delirium due to known physiological condition: Secondary | ICD-10-CM | POA: Diagnosis not present

## 2020-08-27 DIAGNOSIS — F1190 Opioid use, unspecified, uncomplicated: Secondary | ICD-10-CM | POA: Diagnosis not present

## 2020-09-03 ENCOUNTER — Inpatient Hospital Stay: Payer: Medicare HMO | Admitting: Family Medicine

## 2020-09-05 ENCOUNTER — Inpatient Hospital Stay: Payer: Medicare HMO | Admitting: Family Medicine

## 2021-01-19 ENCOUNTER — Ambulatory Visit: Payer: Self-pay

## 2021-03-20 DIAGNOSIS — M438X6 Other specified deforming dorsopathies, lumbar region: Secondary | ICD-10-CM | POA: Diagnosis not present

## 2021-03-20 DIAGNOSIS — F0150 Vascular dementia without behavioral disturbance: Secondary | ICD-10-CM | POA: Diagnosis not present

## 2021-03-20 DIAGNOSIS — M791 Myalgia, unspecified site: Secondary | ICD-10-CM | POA: Diagnosis not present

## 2021-03-20 DIAGNOSIS — M4316 Spondylolisthesis, lumbar region: Secondary | ICD-10-CM | POA: Diagnosis not present

## 2021-03-20 DIAGNOSIS — E559 Vitamin D deficiency, unspecified: Secondary | ICD-10-CM | POA: Diagnosis not present

## 2021-03-20 DIAGNOSIS — R410 Disorientation, unspecified: Secondary | ICD-10-CM | POA: Diagnosis not present

## 2021-03-20 DIAGNOSIS — Z20822 Contact with and (suspected) exposure to covid-19: Secondary | ICD-10-CM | POA: Diagnosis not present

## 2021-03-20 DIAGNOSIS — M47817 Spondylosis without myelopathy or radiculopathy, lumbosacral region: Secondary | ICD-10-CM | POA: Diagnosis not present

## 2021-03-20 DIAGNOSIS — Z87891 Personal history of nicotine dependence: Secondary | ICD-10-CM | POA: Diagnosis not present

## 2021-03-20 DIAGNOSIS — M5136 Other intervertebral disc degeneration, lumbar region: Secondary | ICD-10-CM | POA: Diagnosis not present

## 2021-03-20 DIAGNOSIS — R1084 Generalized abdominal pain: Secondary | ICD-10-CM | POA: Diagnosis not present

## 2021-03-20 DIAGNOSIS — R519 Headache, unspecified: Secondary | ICD-10-CM | POA: Diagnosis not present

## 2021-03-20 DIAGNOSIS — R440 Auditory hallucinations: Secondary | ICD-10-CM | POA: Diagnosis not present

## 2021-03-20 DIAGNOSIS — F0390 Unspecified dementia without behavioral disturbance: Secondary | ICD-10-CM | POA: Diagnosis not present

## 2021-03-20 DIAGNOSIS — Z79899 Other long term (current) drug therapy: Secondary | ICD-10-CM | POA: Diagnosis not present

## 2021-03-20 DIAGNOSIS — F419 Anxiety disorder, unspecified: Secondary | ICD-10-CM | POA: Diagnosis not present

## 2021-03-20 DIAGNOSIS — H9319 Tinnitus, unspecified ear: Secondary | ICD-10-CM | POA: Diagnosis not present

## 2021-03-20 DIAGNOSIS — R079 Chest pain, unspecified: Secondary | ICD-10-CM | POA: Diagnosis not present

## 2021-03-31 DIAGNOSIS — R1084 Generalized abdominal pain: Secondary | ICD-10-CM | POA: Diagnosis not present

## 2021-03-31 DIAGNOSIS — M79606 Pain in leg, unspecified: Secondary | ICD-10-CM | POA: Diagnosis not present

## 2021-03-31 DIAGNOSIS — F39 Unspecified mood [affective] disorder: Secondary | ICD-10-CM | POA: Diagnosis not present

## 2021-03-31 DIAGNOSIS — R001 Bradycardia, unspecified: Secondary | ICD-10-CM | POA: Diagnosis not present

## 2021-03-31 DIAGNOSIS — R1310 Dysphagia, unspecified: Secondary | ICD-10-CM | POA: Diagnosis not present

## 2021-03-31 DIAGNOSIS — R441 Visual hallucinations: Secondary | ICD-10-CM | POA: Diagnosis not present

## 2021-03-31 DIAGNOSIS — R0602 Shortness of breath: Secondary | ICD-10-CM | POA: Diagnosis not present

## 2021-03-31 DIAGNOSIS — R627 Adult failure to thrive: Secondary | ICD-10-CM | POA: Diagnosis not present

## 2021-03-31 DIAGNOSIS — R4189 Other symptoms and signs involving cognitive functions and awareness: Secondary | ICD-10-CM | POA: Diagnosis not present

## 2021-03-31 DIAGNOSIS — F0390 Unspecified dementia without behavioral disturbance: Secondary | ICD-10-CM | POA: Diagnosis not present

## 2021-03-31 DIAGNOSIS — R410 Disorientation, unspecified: Secondary | ICD-10-CM | POA: Diagnosis not present

## 2021-03-31 DIAGNOSIS — H9319 Tinnitus, unspecified ear: Secondary | ICD-10-CM | POA: Diagnosis not present

## 2021-03-31 DIAGNOSIS — F22 Delusional disorders: Secondary | ICD-10-CM | POA: Diagnosis not present

## 2021-10-20 IMAGING — CT CT HEAD W/O CM
3 series · 16 of 47 positions shown, 19 images · non-contrast
Comparison: 03/04/2014

CLINICAL DATA: Mental status changes.  Disorientation.

EXAM:
CT HEAD WITHOUT CONTRAST
TECHNIQUE: Contiguous axial images were obtained from the base of the skull
through the vertex without intravenous contrast.

[Series 2: head wo · axial · 0.40mm/px · z∈[+461,+596]mm · 10 of 33 slices shown, 13 images]
[im 3/33  brain]
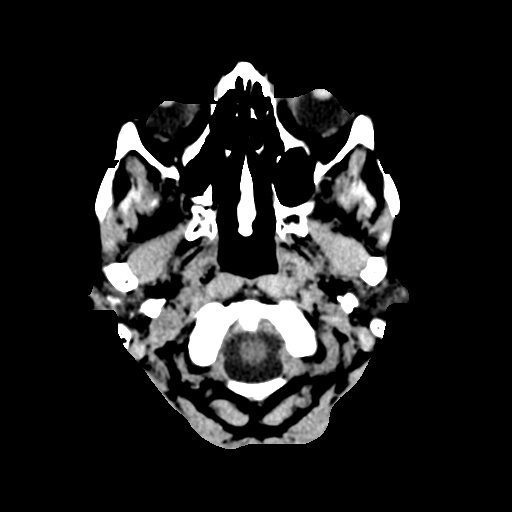
[im 3/33  bone]
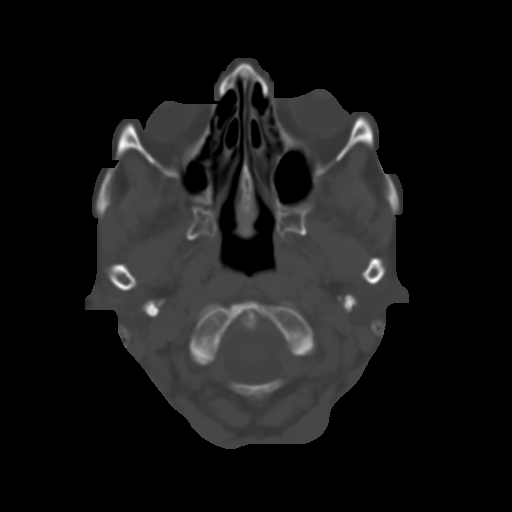
[im 6/33  brain]
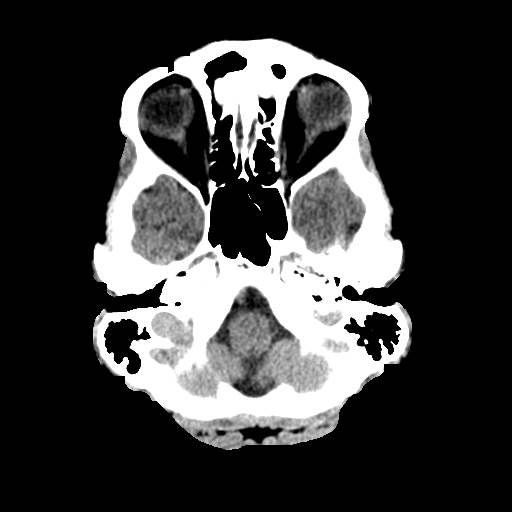
[im 9/33  brain]
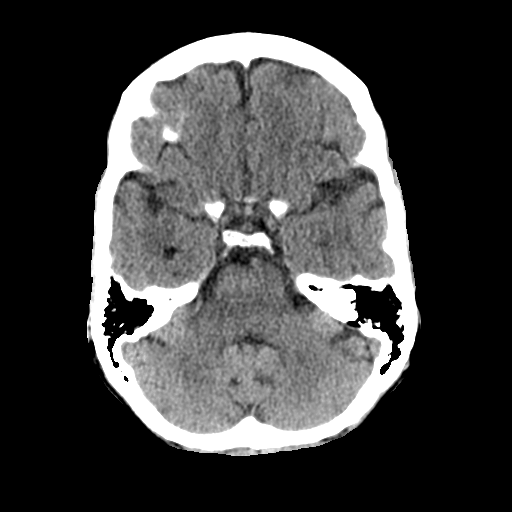
[im 12/33  brain]
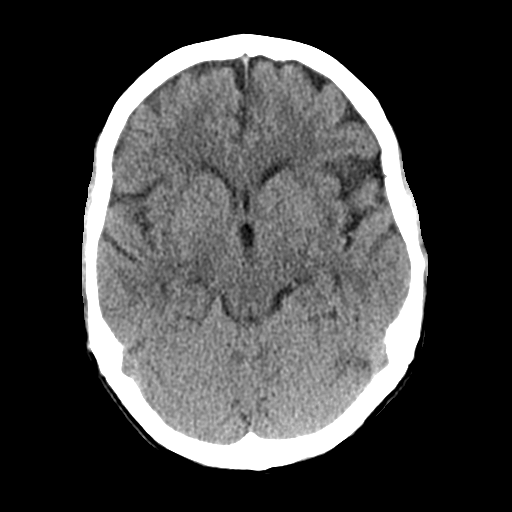
[im 15/33  brain]
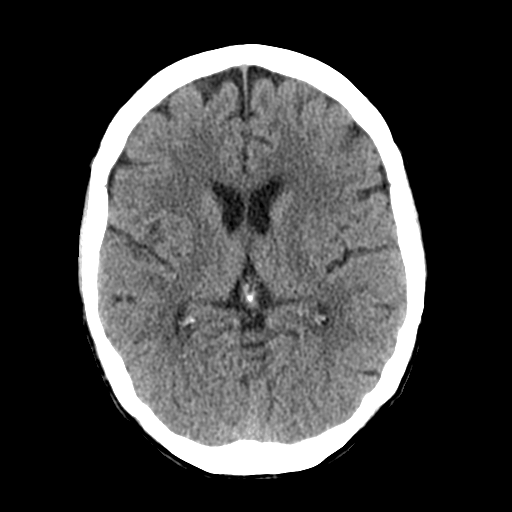
[im 15/33  bone]
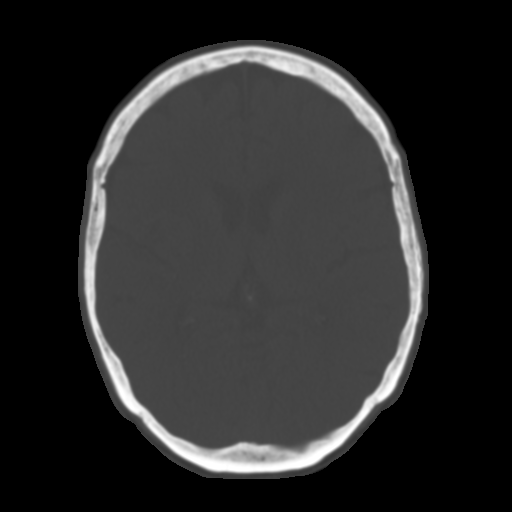
[im 18/33  brain]
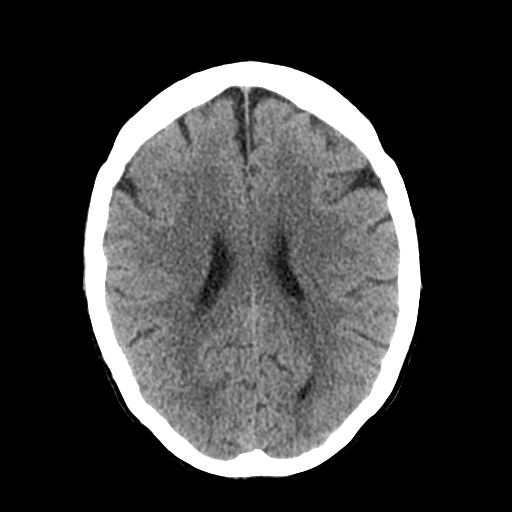
[im 21/33  brain]
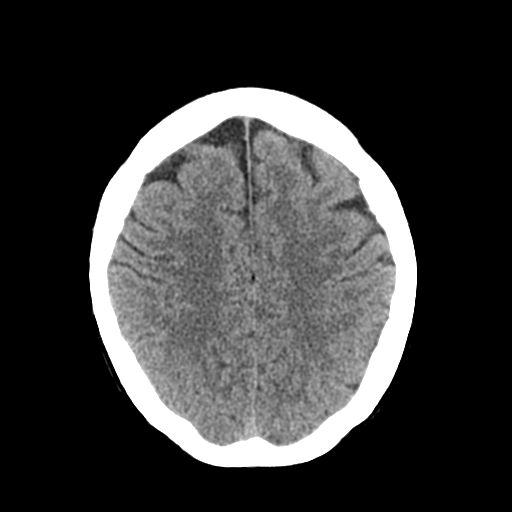
[im 25/33  brain]
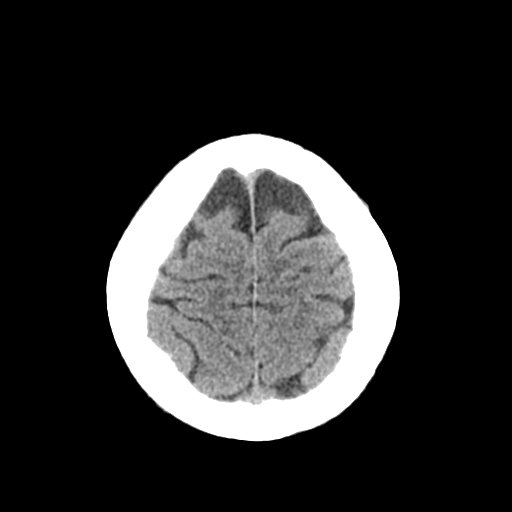
[im 27/33  brain]
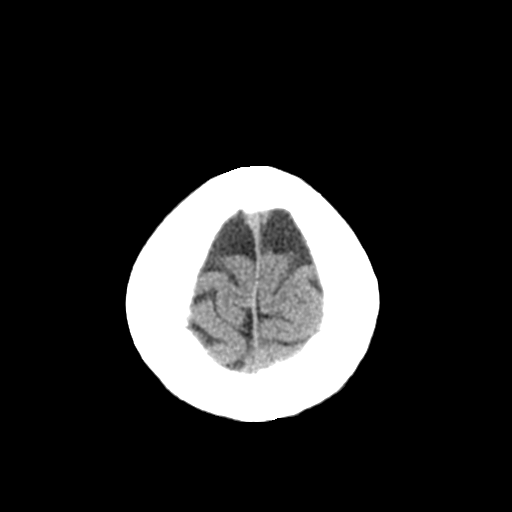
[im 27/33  bone]
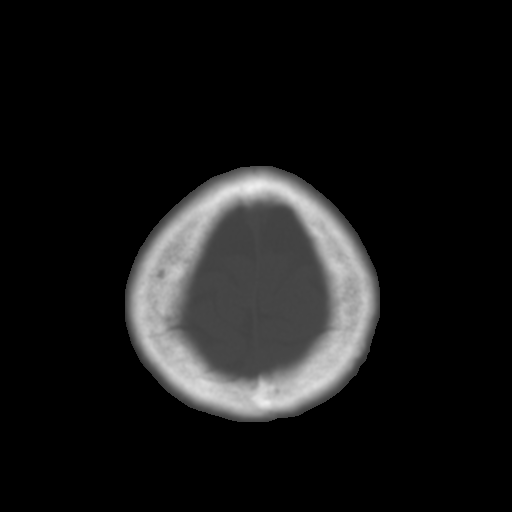
[im 30/33  brain]
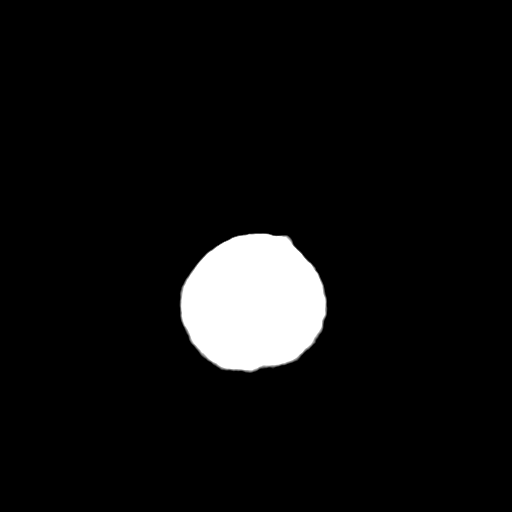

[Series 4: coronal soft tissue · coronal · 0.31mm/px · 3 of 68 slices shown]
[im 23/68  brain]
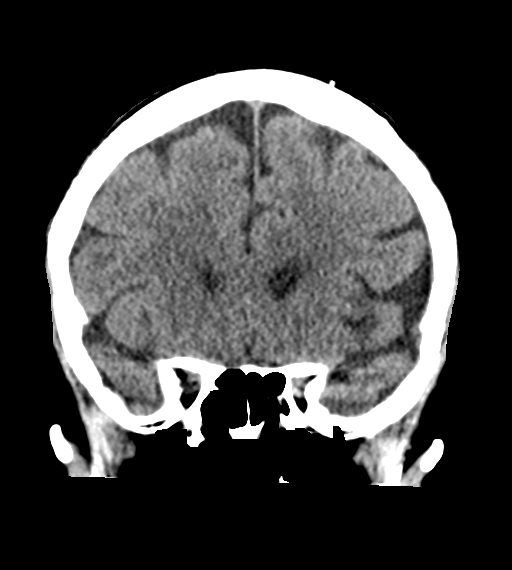
[im 30/68  brain]
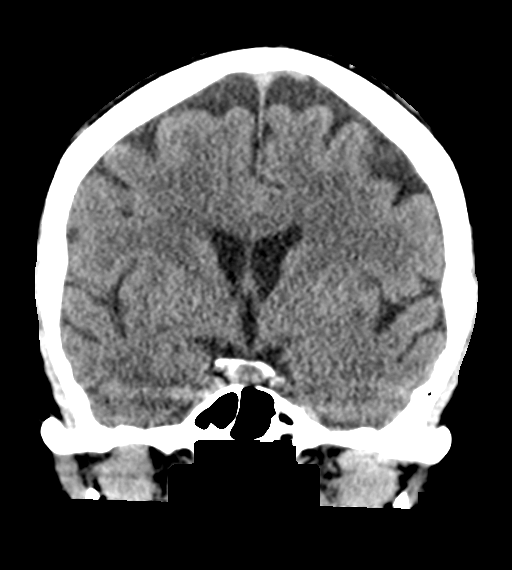
[im 38/68  brain]
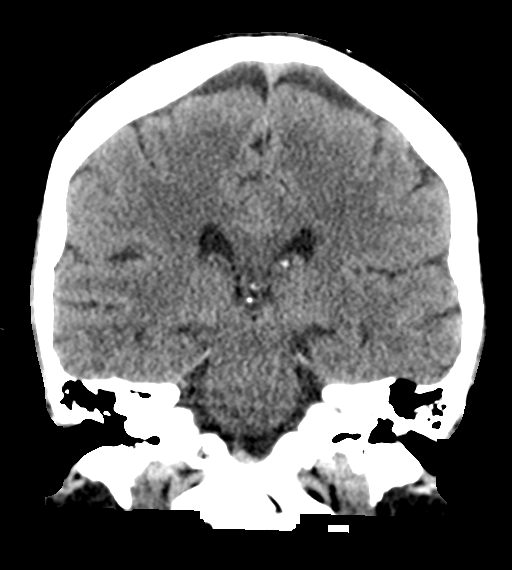

[Series 5: sagittal soft tissue · sagittal · 0.32mm/px · 3 of 55 slices shown]
[im 19/55  brain]
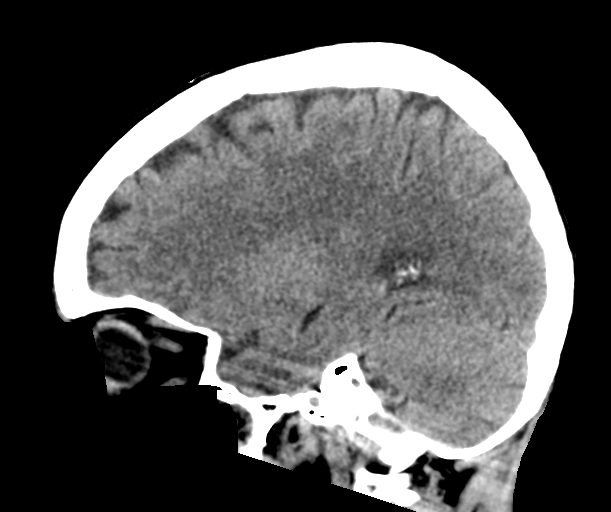
[im 28/55  brain]
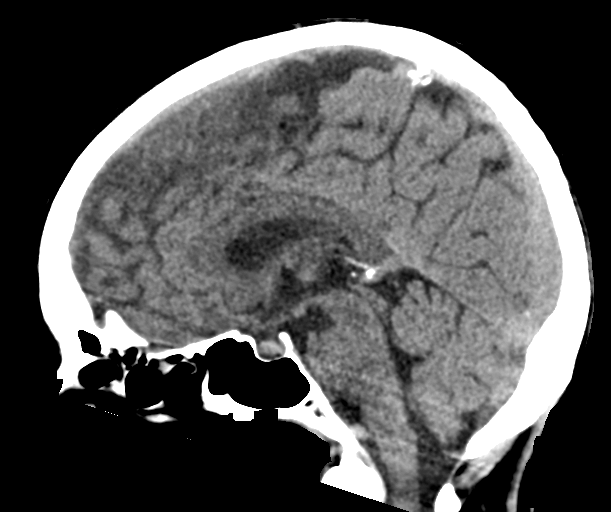
[im 37/55  brain]
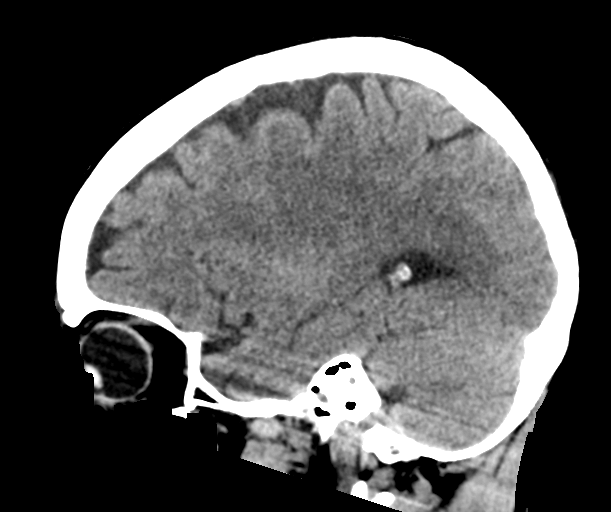

[16 of 47 positions shown; findings below may reference images not displayed]

FINDINGS: Brain: The brain shows a normal appearance without evidence of
malformation, atrophy, old or acute small or large vessel
infarction, mass lesion, hemorrhage, hydrocephalus or extra-axial
collection.

Vascular: No hyperdense vessel. No evidence of atherosclerotic
calcification.

Skull: Normal.  No traumatic finding.  No focal bone lesion.

Sinuses/Orbits: Sinuses are clear. Orbits appear normal. Mastoids
are clear.

Other: None significant
IMPRESSION: Normal head CT

## 2022-12-06 ENCOUNTER — Other Ambulatory Visit: Payer: Self-pay

## 2022-12-06 ENCOUNTER — Emergency Department: Payer: Medicare HMO

## 2022-12-06 ENCOUNTER — Emergency Department
Admission: EM | Admit: 2022-12-06 | Discharge: 2022-12-07 | Disposition: A | Discharge status: Other Acute Inpatient Hospital | Payer: Medicare HMO | Attending: Emergency Medicine | Admitting: Emergency Medicine

## 2022-12-06 DIAGNOSIS — S5012XA Contusion of left forearm, initial encounter: Secondary | ICD-10-CM | POA: Insufficient documentation

## 2022-12-06 DIAGNOSIS — Y900 Blood alcohol level of less than 20 mg/100 ml: Secondary | ICD-10-CM | POA: Diagnosis not present

## 2022-12-06 DIAGNOSIS — Z20822 Contact with and (suspected) exposure to covid-19: Secondary | ICD-10-CM | POA: Diagnosis not present

## 2022-12-06 DIAGNOSIS — F23 Brief psychotic disorder: Secondary | ICD-10-CM | POA: Diagnosis present

## 2022-12-06 DIAGNOSIS — F1210 Cannabis abuse, uncomplicated: Secondary | ICD-10-CM | POA: Diagnosis not present

## 2022-12-06 DIAGNOSIS — F29 Unspecified psychosis not due to a substance or known physiological condition: Secondary | ICD-10-CM | POA: Insufficient documentation

## 2022-12-06 DIAGNOSIS — R451 Restlessness and agitation: Secondary | ICD-10-CM | POA: Insufficient documentation

## 2022-12-06 DIAGNOSIS — R4182 Altered mental status, unspecified: Secondary | ICD-10-CM | POA: Diagnosis not present

## 2022-12-06 DIAGNOSIS — Z23 Encounter for immunization: Secondary | ICD-10-CM | POA: Insufficient documentation

## 2022-12-06 DIAGNOSIS — F900 Attention-deficit hyperactivity disorder, predominantly inattentive type: Secondary | ICD-10-CM | POA: Diagnosis not present

## 2022-12-06 DIAGNOSIS — F22 Delusional disorders: Secondary | ICD-10-CM | POA: Diagnosis not present

## 2022-12-06 DIAGNOSIS — F17210 Nicotine dependence, cigarettes, uncomplicated: Secondary | ICD-10-CM | POA: Insufficient documentation

## 2022-12-06 DIAGNOSIS — Z79899 Other long term (current) drug therapy: Secondary | ICD-10-CM | POA: Insufficient documentation

## 2022-12-06 DIAGNOSIS — Y92009 Unspecified place in unspecified non-institutional (private) residence as the place of occurrence of the external cause: Secondary | ICD-10-CM | POA: Diagnosis not present

## 2022-12-06 DIAGNOSIS — S5011XA Contusion of right forearm, initial encounter: Secondary | ICD-10-CM | POA: Diagnosis not present

## 2022-12-06 DIAGNOSIS — R000 Tachycardia, unspecified: Secondary | ICD-10-CM | POA: Insufficient documentation

## 2022-12-06 DIAGNOSIS — F0390 Unspecified dementia without behavioral disturbance: Secondary | ICD-10-CM | POA: Insufficient documentation

## 2022-12-06 DIAGNOSIS — Z046 Encounter for general psychiatric examination, requested by authority: Secondary | ICD-10-CM | POA: Diagnosis present

## 2022-12-06 LAB — RESP PANEL BY RT-PCR (RSV, FLU A&B, COVID)  RVPGX2
Influenza A by PCR: NEGATIVE
Influenza B by PCR: NEGATIVE
Resp Syncytial Virus by PCR: NEGATIVE
SARS Coronavirus 2 by RT PCR: NEGATIVE

## 2022-12-06 LAB — CBC WITH DIFFERENTIAL/PLATELET
Abs Immature Granulocytes: 0.1 10*3/uL — ABNORMAL HIGH (ref 0.00–0.07)
Basophils Absolute: 0.1 10*3/uL (ref 0.0–0.1)
Basophils Relative: 1 %
Eosinophils Absolute: 0.1 10*3/uL (ref 0.0–0.5)
Eosinophils Relative: 1 %
HCT: 40.5 % (ref 36.0–46.0)
Hemoglobin: 13.8 g/dL (ref 12.0–15.0)
Immature Granulocytes: 1 %
Lymphocytes Relative: 51 %
Lymphs Abs: 4.9 10*3/uL — ABNORMAL HIGH (ref 0.7–4.0)
MCH: 31.2 pg (ref 26.0–34.0)
MCHC: 34.1 g/dL (ref 30.0–36.0)
MCV: 91.6 fL (ref 80.0–100.0)
Monocytes Absolute: 0.6 10*3/uL (ref 0.1–1.0)
Monocytes Relative: 6 %
Neutro Abs: 3.8 10*3/uL (ref 1.7–7.7)
Neutrophils Relative %: 40 %
Platelets: 329 10*3/uL (ref 150–400)
RBC: 4.42 MIL/uL (ref 3.87–5.11)
RDW: 12.4 % (ref 11.5–15.5)
WBC: 9.6 10*3/uL (ref 4.0–10.5)
nRBC: 0 % (ref 0.0–0.2)

## 2022-12-06 LAB — ACETAMINOPHEN LEVEL: Acetaminophen (Tylenol), Serum: <10 ug/mL — ABNORMAL LOW (ref 10–30)

## 2022-12-06 LAB — COMPREHENSIVE METABOLIC PANEL
ALT: 8 U/L (ref 0–44)
AST: 27 U/L (ref 15–41)
Albumin: 4.2 g/dL (ref 3.5–5.0)
Alkaline Phosphatase: 80 U/L (ref 38–126)
Anion gap: 15 (ref 5–15)
BUN: 8 mg/dL (ref 8–23)
CO2: 21 mmol/L — ABNORMAL LOW (ref 22–32)
Calcium: 9.5 mg/dL (ref 8.9–10.3)
Chloride: 107 mmol/L (ref 98–111)
Creatinine, Ser: 0.74 mg/dL (ref 0.44–1.00)
GFR, Estimated: >60 mL/min (ref >60)
Glucose, Bld: 165 mg/dL — ABNORMAL HIGH (ref 70–99)
Potassium: 3.2 mmol/L — ABNORMAL LOW (ref 3.5–5.1)
Sodium: 143 mmol/L (ref 135–145)
Total Bilirubin: 0.7 mg/dL (ref 0.3–1.2)
Total Protein: 7.7 g/dL (ref 6.5–8.1)

## 2022-12-06 LAB — ETHANOL: Alcohol, Ethyl (B): <10 mg/dL (ref <10)

## 2022-12-06 LAB — CBG MONITORING, ED: Glucose-Capillary: 141 mg/dL — ABNORMAL HIGH (ref 70–99)

## 2022-12-06 LAB — SALICYLATE LEVEL: Salicylate Lvl: <7 mg/dL — ABNORMAL LOW (ref 7.0–30.0)

## 2022-12-06 MED ORDER — LACTATED RINGERS IV BOLUS
1000.0000 mL | Freq: Once | INTRAVENOUS | Status: AC
  Administered 2022-12-06: 1000 mL via INTRAVENOUS

## 2022-12-06 MED ORDER — TETANUS-DIPHTH-ACELL PERTUSSIS 5-2.5-18.5 LF-MCG/0.5 IM SUSY
0.5000 mL | PREFILLED_SYRINGE | Freq: Once | INTRAMUSCULAR | Status: AC
  Administered 2022-12-06: 0.5 mL via INTRAMUSCULAR
  Filled 2022-12-06: qty 0.5

## 2022-12-06 MED ORDER — LORAZEPAM 2 MG/ML IJ SOLN
1.0000 mg | Freq: Once | INTRAMUSCULAR | Status: AC
  Administered 2022-12-06: 1 mg via INTRAVENOUS
  Filled 2022-12-06: qty 1

## 2022-12-06 MED ORDER — OLANZAPINE 5 MG PO TABS
5.0000 mg | ORAL_TABLET | Freq: Every day | ORAL | Status: DC
  Administered 2022-12-06: 5 mg via ORAL
  Filled 2022-12-06: qty 1

## 2022-12-06 MED ORDER — HALOPERIDOL LACTATE 5 MG/ML IJ SOLN
5.0000 mg | Freq: Once | INTRAMUSCULAR | Status: AC
  Administered 2022-12-06: 5 mg via INTRAVENOUS
  Filled 2022-12-06: qty 1

## 2022-12-06 NOTE — BH Assessment (Signed)
PATIENT BED AVAILABLE AFTER 1PM ON 12/07/22  Patient has been accepted to [facility].  Patient assigned to [address] Accepting physician is Dr. Nolon Rod.  Call report to [phone].  Representative was Precious.   ER Staff is aware of it:  Carlene ER Secretary  Dr. Modesto Charon, ER MD  Sue Lush Patient's Nurse     Patient's Family/Support System [facility] [phone]) have been updated as well. Patient's husband provided with the address and phone number of the facility.

## 2022-12-06 NOTE — BH Assessment (Signed)
Referral information for Psychiatric Hospitalization faxed to;   [facility] [phone]- [phone]),   [facility] [phone]),  [address] [phone]),   [facility] [phone] -or- [phone]),   [facility] [phone])  [place] [phone] or [phone]),   [facility] [phone])  [facility] [phone] -or- [phone]) [phone]

## 2022-12-06 NOTE — ED Notes (Signed)
Pt given dinner tray and beverage  
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
## 2022-12-06 NOTE — ED Notes (Signed)
Louise np in with pt now.
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
## 2022-12-06 NOTE — ED Notes (Addendum)
Patient came out into the hallway to ask where the bathroom was. Pt ambulated to bathroom without assistance but this tech noticed bed and scrubs were soiled. Pt given new scrubs to change into and a change of linens but pt kept refusing.

## 2022-12-06 NOTE — BH Assessment (Incomplete)
Comprehensive Clinical Assessment (CCA) Note  12/06/2022 Janet Murphy [id number]  Janet Murphy, 66 year old   Chief Complaint:  Chief Complaint  Patient presents with   Psychiatric Evaluation   Visit Diagnosis: ***    CCA Screening, Triage and Referral (STR)  Patient Reported Information How did you hear about [place]? -- Mudlogger)  Referral name: No data recorded Referral phone number: No data recorded  Whom do you see for routine medical problems? No data recorded Practice/Facility Name: No data recorded Practice/Facility Phone Number: No data recorded Name of Contact: No data recorded Contact Number: No data recorded Contact Fax Number: No data recorded Prescriber Name: No data recorded Prescriber Address (if known): No data recorded  What Is the Reason for Your Visit/Call Today? Per IVC paperwork, patient is delusional and refusing medical attention.  How Long Has This Been Causing You Problems? 1-6 months  What Do You Feel Would Help You the Most Today? Medication(s)   Have You Recently Been in Any Inpatient Treatment (Hospital/Detox/Crisis Center/28-Day Program)? No data recorded Name/Location of Program/Hospital:No data recorded How Long Were You There? No data recorded When Were You Discharged? No data recorded  Have You Ever Received Services From [facility] Before? No data recorded Who Do You See at [facility]? No data recorded  Have You Recently Had Any Thoughts About Hurting Yourself? No data recorded Are You Planning to Commit Suicide/Harm Yourself At This time? No data recorded  Have you Recently Had Thoughts About Hurting Someone Janet Murphy? No data recorded Explanation: No data recorded  Have You Used Any Alcohol or Drugs in the Past 24 Hours? No data recorded How Long Ago Did You Use Drugs or Alcohol? No data recorded What Did You Use and How Much? No data recorded  Do You Currently Have a Therapist/Psychiatrist? No data recorded Name  of Therapist/Psychiatrist: No data recorded  Have You Been Recently Discharged From Any Office Practice or Programs? No data recorded Explanation of Discharge From Practice/Program: No data recorded    CCA Screening Triage Referral Assessment Type of Contact: Face-to-Face  Is this Initial or Reassessment? No data recorded Date Telepsych consult ordered in [facility]:  No data recorded Time Telepsych consult ordered in [facility]:  No data recorded  Patient Reported Information Reviewed? No data recorded Patient Left Without Being Seen? No data recorded Reason for Not Completing Assessment: No data recorded  Collateral Involvement: None provided   Does Patient Have a Court Appointed Legal Guardian? No data recorded Name and Contact of Legal Guardian: No data recorded If Minor and Not Living with Parent(s), Who has Custody? No data recorded Is CPS involved or ever been involved? Never  Is APS involved or ever been involved? Never   Patient Determined To Be At Risk for Harm To Self or Others Based on Review of Patient Reported Information or Presenting Complaint? No data recorded Method: No data recorded Availability of Means: No data recorded Intent: No data recorded Notification Required: No data recorded Additional Information for Danger to Others Potential: No data recorded Additional Comments for Danger to Others Potential: No data recorded Are There Guns or Other Weapons in Your Home? No data recorded Types of Guns/Weapons: No data recorded Are These Weapons Safely Secured?                            No data recorded Who Could Verify You Are Able To Have These Secured: No data recorded Do  You Have any Outstanding Charges, Pending Court Dates, Parole/Probation? No data recorded Contacted To Inform of Risk of Harm To Self or Others: No data recorded  Location of Assessment: [facility] ED   Does Patient Present under Involuntary Commitment? Yes  IVC Papers Initial File Date: No data  recorded  [place] of Residence: [place]   Patient Currently Receiving the Following Services: Not Receiving Services   Determination of Need: Emergent (2 hours)   Options For Referral: Inpatient Hospitalization; ED Visit; Outpatient Therapy; Medication Management     CCA Biopsychosocial Intake/Chief Complaint:  No data recorded Current Symptoms/Problems: No data recorded  Patient Reported Schizophrenia/Schizoaffective Diagnosis in Past: No   Strengths: Patient is able to communicate.  Preferences: No data recorded Abilities: No data recorded  Type of Services Patient Feels are Needed: No data recorded  Initial Clinical Notes/Concerns: No data recorded  Mental Health Symptoms Depression:   None   Duration of Depressive symptoms: No data recorded  Mania:   Change in energy/activity; Increased Energy   Anxiety:   No data recorded  Psychosis:   Grossly disorganized or catatonic behavior; Delusions   Duration of Psychotic symptoms: No data recorded  Trauma:   None   Obsessions:   None   Compulsions:   None   Inattention:   Disorganized   Hyperactivity/Impulsivity:   None   Oppositional/Defiant Behaviors:   None   Emotional Irregularity:   None   Other Mood/Personality Symptoms:  No data recorded   Mental Status Exam Appearance and self-care  Stature:   Small   Weight:   Thin   Clothing:   Disheveled   Grooming:   Neglected   Cosmetic use:   None   Posture/gait:   Tense   Motor activity:   Not Remarkable   Sensorium  Attention:   Confused; Unaware   Concentration:   Preoccupied   Orientation:   Person   Recall/memory:  No data recorded  Affect and Mood  Affect:   Constricted; Anxious   Mood:   Anxious   Relating  Eye contact:   Normal   Facial expression:   Fearful   Attitude toward examiner:   Uninterested   Thought and Language  Speech flow:  Pressured; Articulation error   Thought content:    Delusions; Appropriate to Mood and Circumstances   Preoccupation:   None   Hallucinations:  No data recorded  Organization:  No data recorded  [employer] of Knowledge:   Average   Intelligence:   Average   Abstraction:   Normal   Judgement:   Impaired   Reality Testing:   Unaware   Insight:   Unaware   Decision Making:   Confused   Social Functioning  Social Maturity:  No data recorded  Social Judgement:  No data recorded  Stress  Stressors:  No data recorded  Coping Ability:  No data recorded  Skill Deficits:   Communication; Decision making; Self-care   Supports:   Family     Religion:    Leisure/Recreation:    Exercise/Diet:     CCA Employment/Education Employment/Work Situation:    Education:     CCA Family/Childhood History Family and Relationship History: Family history Marital status: Married  Childhood History:     Child/Adolescent Assessment:     CCA Substance Use Alcohol/Drug Use: Alcohol / Drug Use Pain Medications: See PTA Prescriptions: See PTA Over the Counter: See PTA History of alcohol / drug use?: No history of alcohol / drug  abuse Longest period of sobriety (when/how long): n/a                         ASAM's:  Six Dimensions of Multidimensional Assessment  Dimension 1:  Acute Intoxication and/or Withdrawal Potential:      Dimension 2:  Biomedical Conditions and Complications:      Dimension 3:  Emotional, Behavioral, or Cognitive Conditions and Complications:     Dimension 4:  Readiness to Change:     Dimension 5:  Relapse, Continued use, or Continued Problem Potential:     Dimension 6:  Recovery/Living Environment:     ASAM Severity Score:    ASAM Recommended Level of Treatment:     Substance use Disorder (SUD)    Recommendations for Services/Supports/Treatments:    DSM5 Diagnoses: Patient Active Problem List   Diagnosis Date Noted   Acute psychosis (HCC) 12/06/2022    Major depressive disorder, recurrent severe without psychotic features (HCC) 08/10/2020   Vitamin D deficiency 06/16/2017   B12 deficiency 06/16/2017   Underweight 06/15/2017   Neurocognitive disorder 04/04/2014   DDD (degenerative disc disease), thoracic 03/22/2014    Patient Centered Plan: Patient is on the following Treatment Plan(s):  {[facility] AMB BH OP Treatment Plans:[id number]}   Referrals to Alternative Service(s): Referred to Alternative Service(s):   Place:   Date:   Time:    Referred to Alternative Service(s):   Place:   Date:   Time:    Referred to Alternative Service(s):   Place:   Date:   Time:    Referred to Alternative Service(s):   Place:   Date:   Time:      @BHCOLLABOFCARE @  , Counselor

## 2022-12-06 NOTE — ED Notes (Signed)
Pt refusing to get into wheelchair for ct scan.  Pt is on a low hospital bed.

## 2022-12-06 NOTE — ED Notes (Signed)
Arrives in bilateral upper forensic restraints. Multiple small skin tears to bilateral lower forearms that appear to be from cuffs.

## 2022-12-06 NOTE — ED Notes (Signed)
Pt taken to CT via wheelchair accompanied by this nurse and security. Pt attempting to get up, bite staff, screaming at loudly once on CT table and cursing at staff. MD made aware.

## 2022-12-06 NOTE — Consult Note (Signed)
[facility] Face-to-Face Psychiatry Consult   Reason for Consult:  psychosis Referring Physician:  Scotty Court Patient Identification: Janet Murphy MRN:  [id number] Principal Diagnosis: Psychosis (HCC) Diagnosis:  Principal Problem:   Psychosis (HCC)   Total Time spent with patient: 30 minutes  Subjective: "I'm asleep" Janet Murphy is a 66 y.o. female patient admitted with psychosis.  HPI:  Patient presents to ED under IVC with law enforcement. Patient does not/unable to participate in evaluation. She was medicated with Haldol on arrival. On evaluation, patient is asleep when we walk into the room, but she is  able to wake, becomes alert. No aggression shown. Patient appear afraid, continues to ask "whose out there" (referring to security in the hall). Just says "I am asleep." She does not answer any questions pertaining to what brought her here and if she thinks anything/anyone is trying to hurt her. She doesn't reply if she knows she is in the hospital. She denies marijuana use. (Husband says she smokes a lot, daily).     Collateral from husband, Ermelinda Das: He states that "she has been getting worse for  the past month."  She does not have an official dementia diagnosis. She has had depression since their son passed away in 04-25-2002 and was in [facility] psych unit in 2014-04-25. Husband says she does nothing all day. "Stay in bed and smokes marijuana.'She was up all night last night, psychotic, thinks aliens are eating her. Refuses to go to the doctor. Has not been to a doctor since in the ED at [facility] since 2021-04-25. He went to magistrate to get her IVC'd. Is reaching out to APS to try and get some help at home; may petition for guardianship.   Past Psychiatric History: History of prescribed opiate abuse, none since 04-25-2014. Hospitalized in Apr 25, 2014  Risk to Self:   Risk to Others:   Prior Inpatient Therapy:   Prior Outpatient Therapy:    Past Medical History:  Past Medical History:  Diagnosis Date    Back pain    Shoulder pain     Past Surgical History:  Procedure Laterality Date   ABDOMINAL HYSTERECTOMY     partial    Family History:  Family History  Problem Relation Age of Onset   Arthritis Mother    Stroke Mother    Heart attack Father    Early death Son 14       car accident    Family Psychiatric  History:  Social History:  Social History   Substance and Sexual Activity  Alcohol Use No     Social History   Substance and Sexual Activity  Drug Use No    Social History   Socioeconomic History   Marital status: Married    Spouse name: Not on file   Number of children: Not on file   Years of education: Not on file   Highest education level: Not on file  Occupational History   Not on file  Tobacco Use   Smoking status: Every Day    Packs/day: 0.50    Types: Cigarettes   Smokeless tobacco: Never  Vaping Use   Vaping Use: Never used  Substance and Sexual Activity   Alcohol use: No   Drug use: No   Sexual activity: Yes  Other Topics Concern   Not on file  Social History Narrative   Not on file   Social Determinants of Health   Financial Resource Strain: Not on file  Food Insecurity: Not on  file  Transportation Needs: Not on file  Physical Activity: Not on file  Stress: Not on file  Social Connections: Not on file   Additional Social History:    Allergies:  No Known Allergies  Labs:  Results for orders placed or performed during the hospital encounter of 12/06/22 (from the past 48 hour(s))  Acetaminophen level     Status: Abnormal   Collection Time: 12/06/22 12:55 PM  Result Value Ref Range   Acetaminophen (Tylenol), Serum <10 (L) 10 - 30 ug/mL    Comment: (NOTE) Therapeutic concentrations vary significantly. A range of 10-30 ug/mL  may be an effective concentration for many patients. However, some  are best treated at concentrations outside of this range. Acetaminophen concentrations >150 ug/mL at 4 hours after ingestion  and >50 ug/mL  at 12 hours after ingestion are often associated with  toxic reactions.  Performed at [facility], [address] [place], [place] [address]   Comprehensive metabolic panel     Status: Abnormal   Collection Time: 12/06/22 12:55 PM  Result Value Ref Range   Sodium 143 135 - 145 mmol/L   Potassium 3.2 (L) 3.5 - 5.1 mmol/L   Chloride 107 98 - 111 mmol/L   CO2 21 (L) 22 - 32 mmol/L   Glucose, Bld 165 (H) 70 - 99 mg/dL    Comment: Glucose reference range applies only to samples taken after fasting for at least 8 hours.   BUN 8 8 - 23 mg/dL   Creatinine, Ser 5.09 0.44 - 1.00 mg/dL   Calcium 9.5 8.9 - 32.6 mg/dL   Total Protein 7.7 6.5 - 8.1 g/dL   Albumin 4.2 3.5 - 5.0 g/dL   AST 27 15 - 41 U/L   ALT 8 0 - 44 U/L   Alkaline Phosphatase 80 38 - 126 U/L   Total Bilirubin 0.7 0.3 - 1.2 mg/dL   GFR, Estimated >71 >24 mL/min    Comment: (NOTE) Calculated using the CKD-EPI Creatinine Equation (2021)    Anion gap 15 5 - 15    Comment: Performed at [facility], [address], [place], [place] [address]  Ethanol     Status: None   Collection Time: 12/06/22 12:55 PM  Result Value Ref Range   Alcohol, Ethyl (B) <10 <10 mg/dL    Comment: (NOTE) Lowest detectable limit for serum alcohol is 10 mg/dL.  For medical purposes only. Performed at [facility], [address] [place], [place] [address]   Salicylate level     Status: Abnormal   Collection Time: 12/06/22 12:55 PM  Result Value Ref Range   Salicylate Lvl <7.0 (L) 7.0 - 30.0 mg/dL    Comment: Performed at [facility], [address] [place], [place] [address]  CBC with Differential     Status: Abnormal   Collection Time: 12/06/22 12:55 PM  Result Value Ref Range   WBC 9.6 4.0 - 10.5 K/uL   RBC 4.42 3.87 - 5.11 MIL/uL   Hemoglobin 13.8 12.0 - 15.0 g/dL   HCT 76.7 34.1 - 93.7 %   MCV 91.6 80.0 - 100.0 fL   MCH 31.2 26.0 - 34.0 pg   MCHC 34.1 30.0 - 36.0 g/dL   RDW 90.2 40.9 - 73.5  %   Platelets 329 150 - 400 K/uL   nRBC 0.0 0.0 - 0.2 %   Neutrophils Relative % 40 %   Neutro Abs 3.8 1.7 - 7.7 K/uL   Lymphocytes Relative 51 %  Lymphs Abs 4.9 (H) 0.7 - 4.0 K/uL   Monocytes Relative 6 %   Monocytes Absolute 0.6 0.1 - 1.0 K/uL   Eosinophils Relative 1 %   Eosinophils Absolute 0.1 0.0 - 0.5 K/uL   Basophils Relative 1 %   Basophils Absolute 0.1 0.0 - 0.1 K/uL   Immature Granulocytes 1 %   Abs Immature Granulocytes 0.10 (H) 0.00 - 0.07 K/uL    Comment: Performed at [facility], [address] [place], [place] [address]  Resp panel by RT-PCR (RSV, Flu A&B, Covid) Anterior Nasal Swab     Status: None   Collection Time: 12/06/22 12:55 PM   Specimen: Anterior Nasal Swab  Result Value Ref Range   SARS Coronavirus 2 by RT PCR NEGATIVE NEGATIVE    Comment: (NOTE) SARS-CoV-2 target nucleic acids are NOT DETECTED.  The SARS-CoV-2 RNA is generally detectable in upper respiratory specimens during the acute phase of infection. The lowest concentration of SARS-CoV-2 viral copies this assay can detect is 138 copies/mL. A negative result does not preclude SARS-Cov-2 infection and should not be used as the sole basis for treatment or other patient management decisions. A negative result may occur with  improper specimen collection/handling, submission of specimen other than nasopharyngeal swab, presence of viral mutation(s) within the areas targeted by this assay, and inadequate number of viral copies(<138 copies/mL). A negative result must be combined with clinical observations, patient history, and epidemiological information. The expected result is Negative.  Fact Sheet for Patients:  BloggerCourse.comhttps://www.fda.gov/media/152166/download  Fact Sheet for Healthcare Providers:  SeriousBroker.ithttps://www.fda.gov/media/152162/download  This test is no t yet approved or cleared by the [place] FDA and  has been authorized for detection and/or diagnosis of SARS-CoV-2 by FDA under  an Emergency Use Authorization (EUA). This EUA will remain  in effect (meaning this test can be used) for the duration of the COVID-19 declaration under Section 564(b)(1) of the Act, 21 U.S.C.section 360bbb-3(b)(1), unless the authorization is terminated  or revoked sooner.       Influenza A by PCR NEGATIVE NEGATIVE   Influenza B by PCR NEGATIVE NEGATIVE    Comment: (NOTE) The Xpert Xpress SARS-CoV-2/FLU/RSV plus assay is intended as an aid in the diagnosis of influenza from Nasopharyngeal swab specimens and should not be used as a sole basis for treatment. Nasal washings and aspirates are unacceptable for Xpert Xpress SARS-CoV-2/FLU/RSV testing.  Fact Sheet for Patients: BloggerCourse.comhttps://www.fda.gov/media/152166/download  Fact Sheet for Healthcare Providers: SeriousBroker.ithttps://www.fda.gov/media/152162/download  This test is not yet approved or cleared by the [place] FDA and has been authorized for detection and/or diagnosis of SARS-CoV-2 by FDA under an Emergency Use Authorization (EUA). This EUA will remain in effect (meaning this test can be used) for the duration of the COVID-19 declaration under Section 564(b)(1) of the Act, 21 U.S.C. section 360bbb-3(b)(1), unless the authorization is terminated or revoked.     Resp Syncytial Virus by PCR NEGATIVE NEGATIVE    Comment: (NOTE) Fact Sheet for Patients: BloggerCourse.comhttps://www.fda.gov/media/152166/download  Fact Sheet for Healthcare Providers: SeriousBroker.ithttps://www.fda.gov/media/152162/download  This test is not yet approved or cleared by the [place] FDA and has been authorized for detection and/or diagnosis of SARS-CoV-2 by FDA under an Emergency Use Authorization (EUA). This EUA will remain in effect (meaning this test can be used) for the duration of the COVID-19 declaration under Section 564(b)(1) of the Act, 21 U.S.C. section 360bbb-3(b)(1), unless the authorization is terminated or revoked.  Performed at [facility], [address] [place], [place] [address]  CBG monitoring, ED     Status: Abnormal   Collection Time: 12/06/22 12:57 PM  Result Value Ref Range   Glucose-Capillary 141 (H) 70 - 99 mg/dL    Comment: Glucose reference range applies only to samples taken after fasting for at least 8 hours.    Current Facility-Administered Medications  Medication Dose Route Frequency Provider Last Rate Last Admin   OLANZapine (ZYPREXA) tablet 5 mg  5 mg Oral QHS Vanetta Mulders, NP       Current Outpatient Medications  Medication Sig Dispense Refill   gabapentin (NEURONTIN) 100 MG capsule Take 1 capsule (100 mg total) by mouth 3 (three) times daily. (Patient not taking: Reported on 12/06/2022) 90 capsule 0   hydrOXYzine (ATARAX/VISTARIL) 25 MG tablet Take 1 tablet (25 mg total) by mouth every 6 (six) hours as needed for anxiety. (Patient not taking: Reported on 12/06/2022) 30 tablet 1   Melatonin 5 MG CAPS Take 10 mg by mouth at bedtime. (Patient not taking: Reported on 12/06/2022)     methocarbamol (ROBAXIN) 500 MG tablet Take 1 tablet (500 mg total) by mouth every 8 (eight) hours as needed for muscle spasms. (Patient not taking: Reported on 12/06/2022) 60 tablet 1   nortriptyline (PAMELOR) 10 MG capsule Take 3 capsules (30 mg total) by mouth at bedtime. (Patient not taking: Reported on 12/06/2022) 30 capsule 1   OLANZapine (ZYPREXA) 5 MG tablet Take 1 tablet (5 mg total) by mouth at bedtime. (Patient not taking: Reported on 12/06/2022) 30 tablet 1    Musculoskeletal: Strength & Muscle Tone: within normal limits Gait & Station: normal Patient leans: N/A  Psychiatric Specialty Exam:  Presentation  General Appearance: Bizarre  Eye Contact:Fair  Speech:Clear and Coherent  Speech Volume:Normal  Handedness:No data recorded  Mood and Affect  Mood:Anxious (suspicious)  Affect:Inappropriate   Thought Process  Thought Processes:Disorganized  Descriptions of Associations:Loose  Orientation:-- (does  not answer orientation questions)  Thought Content:Illogical  History of Schizophrenia/Schizoaffective disorder:No data recorded Duration of Psychotic Symptoms:No data recorded Hallucinations:Hallucinations: -- Rich Reining)  Ideas of Reference:Paranoia (appears paranoid)  Suicidal Thoughts:Suicidal Thoughts: -- Rich Reining)  Homicidal Thoughts:Homicidal Thoughts: -- Rich Reining)   Sensorium  Memory:-- Rich Reining)  Judgment:No data recorded Insight:None   Executive Functions  Concentration:Poor  Attention Span:Poor  Recall:Poor  Fund of Knowledge:Poor  Language:Poor   Psychomotor Activity  Psychomotor Activity:Psychomotor Activity: Normal   Assets  Assets:Resilience; Social Support; Health and safety inspector; Housing   Sleep  Sleep:Sleep: Poor   Physical Exam: Physical Exam Vitals and nursing note reviewed.  HENT:     Head: Normocephalic.     Nose: No congestion or rhinorrhea.  Eyes:     General:        Right eye: No discharge.        Left eye: No discharge.  Cardiovascular:     Rate and Rhythm: Normal rate.  Pulmonary:     Effort: Pulmonary effort is normal.  Musculoskeletal:        General: Normal range of motion.     Cervical back: Normal range of motion.  Skin:    General: Skin is dry.  Neurological:     Mental Status: She is alert and oriented to person, place, and time.  Psychiatric:        Attention and Perception: She is inattentive.        Mood and Affect: Affect is inappropriate.        Thought Content: Thought content is paranoid.        Cognition  and Memory: Cognition is impaired.        Judgment: Judgment is impulsive.    Review of Systems  Reason unable to perform ROS: Patient appears paranoid, Only says "I'm asleep"   Blood pressure (!) 120/100, pulse (!) 112, temperature 97.6 F (36.4 C), temperature source Oral, resp. rate 16, height  (1.702 m), weight 44 kg, SpO2 98 %. Body mass index is 15.19 kg/m.  Treatment Plan Summary: Plan  Patient had previously been prescribed olanzapine nightly, which was re-started .  Patient meets criteria for inpatient psychiatric admission.   Reviewed with Dr. Modesto Charon (EDP) via secure chat   Disposition: Recommend psychiatric Inpatient admission when medically cleared.  Vanetta Mulders, NP 12/06/2022 5:50 PM

## 2022-12-06 NOTE — ED Notes (Signed)
1 bag of belongings include underwear and nightgown. Labeled with correct pt identifiers.

## 2022-12-06 NOTE — ED Provider Notes (Signed)
Emergency Medicine Observation Re-evaluation Note  Janet Murphy is a 66 y.o. female, seen on rounds today.  Pt initially presented to the ED for complaints of Psychiatric Evaluation  Currently, the patient is resting- waiting on placement 12/19  Physical Exam  Blood pressure 118/67, pulse 97, temperature 97.6 F (36.4 C), temperature source Oral, resp. rate 18, height 5\' 7"  (1.702 m), weight 44 kg, SpO2 95 %.  Physical Exam General: No apparent distress Pulm: Normal WOB Psych: resting     ED Course / MDM     I have reviewed the labs performed to date as well as medications administered while in observation.  Recent changes in the last 24 hours include none   Plan   Current plan is to go to unit tomorrow at 1pm Patient is under full IVC at this time.   [employer], MD 12/06/22 [phone]

## 2022-12-07 DIAGNOSIS — S5012XA Contusion of left forearm, initial encounter: Secondary | ICD-10-CM | POA: Diagnosis not present

## 2022-12-07 DIAGNOSIS — F03C0 Unspecified dementia, severe, without behavioral disturbance, psychotic disturbance, mood disturbance, and anxiety: Secondary | ICD-10-CM | POA: Diagnosis not present

## 2022-12-07 DIAGNOSIS — R5383 Other fatigue: Secondary | ICD-10-CM | POA: Diagnosis not present

## 2022-12-07 DIAGNOSIS — F3189 Other bipolar disorder: Secondary | ICD-10-CM | POA: Diagnosis not present

## 2022-12-07 DIAGNOSIS — F419 Anxiety disorder, unspecified: Secondary | ICD-10-CM | POA: Diagnosis not present

## 2022-12-07 DIAGNOSIS — G4700 Insomnia, unspecified: Secondary | ICD-10-CM | POA: Diagnosis not present

## 2022-12-07 DIAGNOSIS — E559 Vitamin D deficiency, unspecified: Secondary | ICD-10-CM | POA: Diagnosis not present

## 2022-12-07 DIAGNOSIS — F17210 Nicotine dependence, cigarettes, uncomplicated: Secondary | ICD-10-CM | POA: Diagnosis not present

## 2022-12-07 DIAGNOSIS — F900 Attention-deficit hyperactivity disorder, predominantly inattentive type: Secondary | ICD-10-CM | POA: Diagnosis not present

## 2022-12-07 DIAGNOSIS — R454 Irritability and anger: Secondary | ICD-10-CM | POA: Diagnosis not present

## 2022-12-07 DIAGNOSIS — R799 Abnormal finding of blood chemistry, unspecified: Secondary | ICD-10-CM | POA: Diagnosis not present

## 2022-12-07 DIAGNOSIS — E568 Deficiency of other vitamins: Secondary | ICD-10-CM | POA: Diagnosis not present

## 2022-12-07 DIAGNOSIS — R451 Restlessness and agitation: Secondary | ICD-10-CM | POA: Diagnosis not present

## 2022-12-07 DIAGNOSIS — R630 Anorexia: Secondary | ICD-10-CM | POA: Diagnosis not present

## 2022-12-07 DIAGNOSIS — F1293 Cannabis use, unspecified with withdrawal: Secondary | ICD-10-CM | POA: Diagnosis not present

## 2022-12-07 DIAGNOSIS — F0390 Unspecified dementia without behavioral disturbance: Secondary | ICD-10-CM | POA: Diagnosis not present

## 2022-12-07 DIAGNOSIS — F308 Other manic episodes: Secondary | ICD-10-CM | POA: Diagnosis not present

## 2022-12-07 DIAGNOSIS — F29 Unspecified psychosis not due to a substance or known physiological condition: Secondary | ICD-10-CM | POA: Diagnosis not present

## 2022-12-07 DIAGNOSIS — I10 Essential (primary) hypertension: Secondary | ICD-10-CM | POA: Diagnosis not present

## 2022-12-07 DIAGNOSIS — F5001 Anorexia nervosa, restricting type: Secondary | ICD-10-CM | POA: Diagnosis not present

## 2022-12-07 DIAGNOSIS — Z20822 Contact with and (suspected) exposure to covid-19: Secondary | ICD-10-CM | POA: Diagnosis not present

## 2022-12-07 DIAGNOSIS — E876 Hypokalemia: Secondary | ICD-10-CM | POA: Diagnosis not present

## 2022-12-07 DIAGNOSIS — S5011XA Contusion of right forearm, initial encounter: Secondary | ICD-10-CM | POA: Diagnosis not present

## 2022-12-07 DIAGNOSIS — Z712 Person consulting for explanation of examination or test findings: Secondary | ICD-10-CM | POA: Diagnosis not present

## 2022-12-07 DIAGNOSIS — F22 Delusional disorders: Secondary | ICD-10-CM | POA: Diagnosis not present

## 2022-12-07 DIAGNOSIS — Z79899 Other long term (current) drug therapy: Secondary | ICD-10-CM | POA: Diagnosis not present

## 2022-12-07 DIAGNOSIS — R238 Other skin changes: Secondary | ICD-10-CM | POA: Diagnosis not present

## 2022-12-07 LAB — URINALYSIS, ROUTINE W REFLEX MICROSCOPIC
Bilirubin Urine: NEGATIVE
Glucose, UA: NEGATIVE mg/dL
Hgb urine dipstick: NEGATIVE
Ketones, ur: NEGATIVE mg/dL
Leukocytes,Ua: NEGATIVE
Nitrite: NEGATIVE
Protein, ur: NEGATIVE mg/dL
Specific Gravity, Urine: 1.009 (ref 1.005–1.030)
pH: 7 (ref 5.0–8.0)

## 2022-12-07 LAB — URINE DRUG SCREEN, QUALITATIVE (ARMC ONLY)
Amphetamines, Ur Screen: NOT DETECTED
Barbiturates, Ur Screen: NOT DETECTED
Benzodiazepine, Ur Scrn: NOT DETECTED
Cannabinoid 50 Ng, Ur ~~LOC~~: POSITIVE — AB
Cocaine Metabolite,Ur ~~LOC~~: NOT DETECTED
MDMA (Ecstasy)Ur Screen: NOT DETECTED
Methadone Scn, Ur: NOT DETECTED
Opiate, Ur Screen: NOT DETECTED
Phencyclidine (PCP) Ur S: NOT DETECTED
Tricyclic, Ur Screen: NOT DETECTED

## 2022-12-07 NOTE — ED Notes (Signed)
Requested patient to provide urine sample, unable to at this time.

## 2022-12-07 NOTE — ED Notes (Signed)
Patient accepted to [facility] /assigned to unit [address] accepting physician is Dr. Nolon Rod / call report to [phone] representative was [facility] /PATIENT BED AVAILABLE AFTER 1 PM ON 12/19 /23

## 2022-12-07 NOTE — ED Notes (Signed)
Husband here to have supervised visit with patient

## 2022-12-07 NOTE — ED Notes (Signed)
Breakfast given.  

## 2022-12-07 NOTE — ED Provider Notes (Signed)
Emergency Medicine Observation Re-evaluation Note  Arlenne Kimbley is a 66 y.o. female, seen on rounds today.  Pt initially presented to the ED for complaints of Psychiatric Evaluation  Currently, the patient is is no acute distress. Denies any concerns at this time.  Physical Exam  Blood pressure (!) 151/72, pulse 76, temperature 98 F (36.7 C), resp. rate 18, height 5\' 7"  (1.702 m), weight 44 kg, SpO2 100 %.  Physical Exam: General: No apparent distress Pulm: Normal WOB Neuro: Moving all extremities Psych: Resting comfortably     ED Course / MDM     I have reviewed the labs performed to date as well as medications administered while in observation.  Recent changes in the last 24 hours include: No acute events overnight.  Transferred to [facility] in stable condition  Plan   Current plan: Patient awaiting psychiatric disposition. Patient is under full IVC at this time.    [facility], MD 12/07/22 1321

## 2022-12-07 NOTE — ED Notes (Signed)
Pt given lunch tray and drink 

## 2022-12-07 NOTE — ED Notes (Signed)
Transferred to [facility] via [facility]. Sent with 1 bag of belongings.

## 2022-12-09 DIAGNOSIS — Z79899 Other long term (current) drug therapy: Secondary | ICD-10-CM | POA: Diagnosis not present

## 2022-12-09 DIAGNOSIS — E559 Vitamin D deficiency, unspecified: Secondary | ICD-10-CM | POA: Diagnosis not present

## 2022-12-09 DIAGNOSIS — F29 Unspecified psychosis not due to a substance or known physiological condition: Secondary | ICD-10-CM | POA: Diagnosis not present

## 2022-12-31 ENCOUNTER — Emergency Department
Admission: EM | Admit: 2022-12-31 | Discharge: 2023-01-02 | Disposition: A | Discharge status: Home / Self Care | Payer: Medicare HMO | Attending: Emergency Medicine | Admitting: Emergency Medicine

## 2022-12-31 ENCOUNTER — Emergency Department: Payer: Medicare HMO

## 2022-12-31 ENCOUNTER — Other Ambulatory Visit: Payer: Self-pay

## 2022-12-31 DIAGNOSIS — F1290 Cannabis use, unspecified, uncomplicated: Secondary | ICD-10-CM | POA: Insufficient documentation

## 2022-12-31 DIAGNOSIS — R442 Other hallucinations: Secondary | ICD-10-CM | POA: Diagnosis not present

## 2022-12-31 DIAGNOSIS — R519 Headache, unspecified: Secondary | ICD-10-CM | POA: Diagnosis not present

## 2022-12-31 DIAGNOSIS — F29 Unspecified psychosis not due to a substance or known physiological condition: Secondary | ICD-10-CM | POA: Diagnosis not present

## 2022-12-31 DIAGNOSIS — S60211A Contusion of right wrist, initial encounter: Secondary | ICD-10-CM | POA: Diagnosis not present

## 2022-12-31 DIAGNOSIS — S60212A Contusion of left wrist, initial encounter: Secondary | ICD-10-CM | POA: Insufficient documentation

## 2022-12-31 DIAGNOSIS — F0392 Unspecified dementia, unspecified severity, with psychotic disturbance: Secondary | ICD-10-CM | POA: Diagnosis not present

## 2022-12-31 DIAGNOSIS — M47812 Spondylosis without myelopathy or radiculopathy, cervical region: Secondary | ICD-10-CM | POA: Insufficient documentation

## 2022-12-31 DIAGNOSIS — Z20822 Contact with and (suspected) exposure to covid-19: Secondary | ICD-10-CM | POA: Diagnosis not present

## 2022-12-31 DIAGNOSIS — F03918 Unspecified dementia, unspecified severity, with other behavioral disturbance: Secondary | ICD-10-CM | POA: Insufficient documentation

## 2022-12-31 DIAGNOSIS — E041 Nontoxic single thyroid nodule: Secondary | ICD-10-CM | POA: Diagnosis not present

## 2022-12-31 DIAGNOSIS — X58XXXA Exposure to other specified factors, initial encounter: Secondary | ICD-10-CM | POA: Diagnosis not present

## 2022-12-31 DIAGNOSIS — S6991XA Unspecified injury of right wrist, hand and finger(s), initial encounter: Secondary | ICD-10-CM | POA: Diagnosis present

## 2022-12-31 DIAGNOSIS — F17210 Nicotine dependence, cigarettes, uncomplicated: Secondary | ICD-10-CM | POA: Insufficient documentation

## 2022-12-31 LAB — COMPREHENSIVE METABOLIC PANEL
ALT: 10 U/L (ref 0–44)
AST: 23 U/L (ref 15–41)
Albumin: 3.1 g/dL — ABNORMAL LOW (ref 3.5–5.0)
Alkaline Phosphatase: 75 U/L (ref 38–126)
Anion gap: 9 (ref 5–15)
BUN: 14 mg/dL (ref 8–23)
CO2: 25 mmol/L (ref 22–32)
Calcium: 8.4 mg/dL — ABNORMAL LOW (ref 8.9–10.3)
Chloride: 103 mmol/L (ref 98–111)
Creatinine, Ser: 0.68 mg/dL (ref 0.44–1.00)
GFR, Estimated: >60 mL/min (ref >60)
Glucose, Bld: 136 mg/dL — ABNORMAL HIGH (ref 70–99)
Potassium: 3.1 mmol/L — ABNORMAL LOW (ref 3.5–5.1)
Sodium: 137 mmol/L (ref 135–145)
Total Bilirubin: 0.5 mg/dL (ref 0.3–1.2)
Total Protein: 6.1 g/dL — ABNORMAL LOW (ref 6.5–8.1)

## 2022-12-31 LAB — CBC
HCT: 36.3 % (ref 36.0–46.0)
Hemoglobin: 12.1 g/dL (ref 12.0–15.0)
MCH: 30.9 pg (ref 26.0–34.0)
MCHC: 33.3 g/dL (ref 30.0–36.0)
MCV: 92.6 fL (ref 80.0–100.0)
Platelets: 294 10*3/uL (ref 150–400)
RBC: 3.92 MIL/uL (ref 3.87–5.11)
RDW: 13.3 % (ref 11.5–15.5)
WBC: 8 10*3/uL (ref 4.0–10.5)
nRBC: 0 % (ref 0.0–0.2)

## 2022-12-31 LAB — RESP PANEL BY RT-PCR (RSV, FLU A&B, COVID)  RVPGX2
Influenza A by PCR: NEGATIVE
Influenza B by PCR: NEGATIVE
Resp Syncytial Virus by PCR: POSITIVE — AB
SARS Coronavirus 2 by RT PCR: NEGATIVE

## 2022-12-31 LAB — ACETAMINOPHEN LEVEL: Acetaminophen (Tylenol), Serum: <10 ug/mL — ABNORMAL LOW (ref 10–30)

## 2022-12-31 LAB — ETHANOL: Alcohol, Ethyl (B): <10 mg/dL (ref <10)

## 2022-12-31 LAB — SALICYLATE LEVEL: Salicylate Lvl: <7 mg/dL — ABNORMAL LOW (ref 7.0–30.0)

## 2022-12-31 MED ORDER — LORAZEPAM 2 MG/ML IJ SOLN
1.0000 mg | Freq: Once | INTRAMUSCULAR | Status: AC
  Administered 2022-12-31: 1 mg via INTRAMUSCULAR
  Filled 2022-12-31: qty 1

## 2022-12-31 MED ORDER — LORAZEPAM 2 MG/ML IJ SOLN: 1.0000 mg | Freq: Once | INTRAMUSCULAR | Status: DC

## 2022-12-31 MED ORDER — HALOPERIDOL LACTATE 5 MG/ML IJ SOLN
3.0000 mg | Freq: Once | INTRAMUSCULAR | Status: AC
  Administered 2022-12-31: 3 mg via INTRAMUSCULAR
  Filled 2022-12-31: qty 1

## 2022-12-31 MED ORDER — HALOPERIDOL LACTATE 5 MG/ML IJ SOLN
2.0000 mg | Freq: Once | INTRAMUSCULAR | Status: AC
  Administered 2022-12-31: 2 mg via INTRAMUSCULAR
  Filled 2022-12-31: qty 1

## 2022-12-31 NOTE — BH Assessment (Signed)
This writer attempted to assess patient but patient has been medicated and currently unable to participate at this time.

## 2022-12-31 NOTE — ED Notes (Signed)
Pt has eyes closed and even respirations. Stretcher taken into pt room and pt slid by this nurse and ED tech across from her bed to hospital stretcher without incident. Pt quickly opened eyes and became very resistant, refusing to stay on stretcher and cursing at staff. MD aware.

## 2022-12-31 NOTE — ED Triage Notes (Signed)
Pt comes via EMS from home with c/o IVC. Pt was combative with EMS and 5 of versed given. Pt is chill now. Pt has hx of dementia. Pt has been having hallucinations.   Pt was in a psych institution for about 3 months. Pt just got out on Dec 21, 2022.   VSS

## 2022-12-31 NOTE — ED Notes (Signed)
Pt placed in wheelchair and taken to CT by this nurse, 2 ED techs, and security. Pt reluctant to get in wheelchair and told this nurse repeatedly that she was "going to sleep" and to "fuck off". Pt reassured she would return to her bed very soon and this nurse would leave her alone once she had her head CT completed. CT performed with no issue.

## 2022-12-31 NOTE — ED Notes (Addendum)
Pt encouraged to sit up and get into wheelchair to go to CT scan. Pt refusing to sit up and uncooperative with this tech, security, and CT tech. RN notified.

## 2022-12-31 NOTE — ED Notes (Signed)
IVC 

## 2022-12-31 NOTE — ED Notes (Signed)
Pt placed back in her room on stretcher. Pt cooperative at this time. Provided for comfort and safety.

## 2022-12-31 NOTE — Progress Notes (Signed)
The psych team met with the client and attempted the assessment.  She opened her eyes and promptly closed them.  Evidently, she is not talking until she gets a room.  She was also given Versed.  Psych will continue to attempt to assess.  Waylan Boga, PMHNP

## 2022-12-31 NOTE — ED Notes (Signed)
Pt is not cooperating for CT scan. MD notified and awaiting orders.

## 2022-12-31 NOTE — ED Notes (Addendum)
Pt adamantly refuses to get into wheelchair pointing to chair and saying "get out" and "I'm asleep". Pt encouraged by this nurse to get in wheelchair to go to CT but pt will not get up off stretcher even with gentle assistance and holds the blanket up to her chin tightly. Pt refuses to let this nurse touch her. MD aware.

## 2022-12-31 NOTE — ED Notes (Signed)
Pt heard repeating "get me my food."Hospital meal tray and a fresh cup of cola with ice in a foam cup was provided. Meal fully consumed and disposed of properly. RN asked pt if she had enough food and pt relied with "Yes." Pt denies needing anything else at this time.

## 2022-12-31 NOTE — ED Notes (Signed)
Pt belongings:   Blue socks Floral gown  Pt dressed out in triage with no issues.

## 2022-12-31 NOTE — ED Notes (Signed)
IVC pending consult, patient not cooperating with NP
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
## 2022-12-31 NOTE — BH Assessment (Signed)
This writer attempted to assess patient but patient has been medicated and currently unable to participate at this time. 
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
## 2022-12-31 NOTE — ED Notes (Signed)
Snack and beverage provided  

## 2023-01-01 DIAGNOSIS — F03918 Unspecified dementia, unspecified severity, with other behavioral disturbance: Secondary | ICD-10-CM

## 2023-01-01 MED ORDER — RISPERIDONE 0.25 MG PO TABS
0.2500 mg | ORAL_TABLET | Freq: Two times a day (BID) | ORAL | Status: DC
  Administered 2023-01-01 (×2): 0.25 mg via ORAL
  Filled 2023-01-01 (×3): qty 1

## 2023-01-01 MED ORDER — POTASSIUM CHLORIDE 20 MEQ PO PACK
20.0000 meq | PACK | Freq: Every day | ORAL | Status: DC
  Administered 2023-01-01 – 2023-01-02 (×2): 20 meq via ORAL
  Filled 2023-01-01 (×2): qty 1

## 2023-01-01 MED ORDER — DIVALPROEX SODIUM 125 MG PO DR TAB
125.0000 mg | DELAYED_RELEASE_TABLET | Freq: Two times a day (BID) | ORAL | Status: DC
  Administered 2023-01-01 (×2): 125 mg via ORAL
  Filled 2023-01-01 (×3): qty 1

## 2023-01-01 NOTE — ED Notes (Signed)
Pt noted to have wet blanket, blanket changed. Pt refuses to let this RN check pants for wetness becoming very agitated and quickly pulling blankets up to cover self. Pt repeatedly saying "goodnight, goodnight, thank you goodnight." Will try again to check/change pt shortly.

## 2023-01-01 NOTE — BH Assessment (Signed)
Patient continues to be unable to participate in assessment at this time due to patient sleeping.
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
## 2023-01-01 NOTE — Consult Note (Signed)
[facility] Face-to-Face Psychiatry Consult   Reason for Consult:  agitation Referring Physician:  EDP Patient Identification: Janet Murphy MRN:  [id number] Principal Diagnosis: Dementia with behavioral disturbance (HCC) Diagnosis:  Principal Problem:   Dementia with behavioral disturbance (HCC)   Total Time spent with patient: 45 minutes  Subjective:   Janet Murphy is a 67 y.o. female patient admitted with agitation.  HPI:  67 yo female that presented to the ED under IVC from her husband, dementia diagnosis.  She went to [facility] for geriatric psych, released on 12/21/22.  Her husband reports, she has been worse.  He did not like the medications she was prescribed and stopped giving these.  On assessment, she client was not aggressive and stated, "Go away."  She did not seem to be in distress and was not responding to internal stimuli.  This provider spoke with her husband who reports she is total care and incontinent.  He reported she was diagnosed with dementia in 2015 at [facility] and has "been going down hill since then."  She smokes marijuana 2-3 times per day with an occasional delusion about alien's biting her.  None reported in the ED, most likely related to her marijuana use.  No threat to herself or others in the ED.  She is not meeting criteria for geriatric psych and the husband's desire is for her to go to a memory care of SNF.  Past Psychiatric History: Major neurocognitive disorder, anxiety, depression  Risk to Self:  none Risk to Others:  none Prior Inpatient Therapy: one   Prior Outpatient Therapy:  outpatient  Past Medical History:  Past Medical History:  Diagnosis Date   Back pain    Shoulder pain     Past Surgical History:  Procedure Laterality Date   ABDOMINAL HYSTERECTOMY     partial    Family History:  Family History  Problem Relation Age of Onset   Arthritis Mother    Stroke Mother    Heart attack Father    Early death Son 65       car accident     Family Psychiatric  History: none Social History:  Social History   Substance and Sexual Activity  Alcohol Use No     Social History   Substance and Sexual Activity  Drug Use No    Social History   Socioeconomic History   Marital status: Married    Spouse name: Not on file   Number of children: Not on file   Years of education: Not on file   Highest education level: Not on file  Occupational History   Not on file  Tobacco Use   Smoking status: Every Day    Packs/day: 0.50    Types: Cigarettes   Smokeless tobacco: Never  Vaping Use   Vaping Use: Never used  Substance and Sexual Activity   Alcohol use: No   Drug use: No   Sexual activity: Yes  Other Topics Concern   Not on file  Social History Narrative   Not on file   Social Determinants of Health   Financial Resource Strain: Not on file  Food Insecurity: Not on file  Transportation Needs: Not on file  Physical Activity: Not on file  Stress: Not on file  Social Connections: Not on file   Additional Social History:    Allergies:  No Known Allergies  Labs:  Results for orders placed or performed during the hospital encounter of 12/31/22 (from the past 48 hour(s))  Ethanol     Status: None   Collection Time: 12/31/22  2:41 PM  Result Value Ref Range   Alcohol, Ethyl (B) <10 <10 mg/dL    Comment: (NOTE) Lowest detectable limit for serum alcohol is 10 mg/dL.  For medical purposes only. Performed at [facility], [address] [place], [place] [address]   Salicylate level     Status: Abnormal   Collection Time: 12/31/22  2:41 PM  Result Value Ref Range   Salicylate Lvl <7.0 (L) 7.0 - 30.0 mg/dL    Comment: Performed at [facility], [address] [place], [place] [address]  Acetaminophen level     Status: Abnormal   Collection Time: 12/31/22  2:41 PM  Result Value Ref Range   Acetaminophen (Tylenol), Serum <10 (L) 10 - 30 ug/mL    Comment: (NOTE) Therapeutic  concentrations vary significantly. A range of 10-30 ug/mL  may be an effective concentration for many patients. However, some  are best treated at concentrations outside of this range. Acetaminophen concentrations >150 ug/mL at 4 hours after ingestion  and >50 ug/mL at 12 hours after ingestion are often associated with  toxic reactions.  Performed at [facility], [address] [place], [place] [address]   cbc     Status: None   Collection Time: 12/31/22  2:41 PM  Result Value Ref Range   WBC 8.0 4.0 - 10.5 K/uL   RBC 3.92 3.87 - 5.11 MIL/uL   Hemoglobin 12.1 12.0 - 15.0 g/dL   HCT 09.3 23.5 - 57.3 %   MCV 92.6 80.0 - 100.0 fL   MCH 30.9 26.0 - 34.0 pg   MCHC 33.3 30.0 - 36.0 g/dL   RDW 22.0 25.4 - 27.0 %   Platelets 294 150 - 400 K/uL   nRBC 0.0 0.0 - 0.2 %    Comment: Performed at [facility], [address] [place], [place] [address]  Comprehensive metabolic panel     Status: Abnormal   Collection Time: 12/31/22  2:41 PM  Result Value Ref Range   Sodium 137 135 - 145 mmol/L   Potassium 3.1 (L) 3.5 - 5.1 mmol/L   Chloride 103 98 - 111 mmol/L   CO2 25 22 - 32 mmol/L   Glucose, Bld 136 (H) 70 - 99 mg/dL    Comment: Glucose reference range applies only to samples taken after fasting for at least 8 hours.   BUN 14 8 - 23 mg/dL   Creatinine, Ser 2.83 0.44 - 1.00 mg/dL   Calcium 8.4 (L) 8.9 - 10.3 mg/dL   Total Protein 6.1 (L) 6.5 - 8.1 g/dL   Albumin 3.1 (L) 3.5 - 5.0 g/dL   AST 23 15 - 41 U/L   ALT 10 0 - 44 U/L   Alkaline Phosphatase 75 38 - 126 U/L   Total Bilirubin 0.5 0.3 - 1.2 mg/dL   GFR, Estimated >15 >17 mL/min    Comment: (NOTE) Calculated using the CKD-EPI Creatinine Equation (2021)    Anion gap 9 5 - 15    Comment: Performed at [facility], [address], [place], [place] [address]  Resp panel by RT-PCR (RSV, Flu A&B, Covid) Anterior Nasal Swab     Status: Abnormal   Collection Time: 12/31/22  2:56 PM   Specimen: Anterior  Nasal Swab  Result Value Ref Range   SARS Coronavirus 2 by RT PCR NEGATIVE NEGATIVE    Comment: (NOTE) SARS-CoV-2 target nucleic acids are NOT  DETECTED.  The SARS-CoV-2 RNA is generally detectable in upper respiratory specimens during the acute phase of infection. The lowest concentration of SARS-CoV-2 viral copies this assay can detect is 138 copies/mL. A negative result does not preclude SARS-Cov-2 infection and should not be used as the sole basis for treatment or other patient management decisions. A negative result may occur with  improper specimen collection/handling, submission of specimen other than nasopharyngeal swab, presence of viral mutation(s) within the areas targeted by this assay, and inadequate number of viral copies(<138 copies/mL). A negative result must be combined with clinical observations, patient history, and epidemiological information. The expected result is Negative.  Fact Sheet for Patients:  EntrepreneurPulse.com.au  Fact Sheet for Healthcare Providers:  IncredibleEmployment.be  This test is no t yet approved or cleared by the [place] FDA and  has been authorized for detection and/or diagnosis of SARS-CoV-2 by FDA under an Emergency Use Authorization (EUA). This EUA will remain  in effect (meaning this test can be used) for the duration of the COVID-19 declaration under Section 564(b)(1) of the Act, 21 U.S.C.section 360bbb-3(b)(1), unless the authorization is terminated  or revoked sooner.       Influenza A by PCR NEGATIVE NEGATIVE   Influenza B by PCR NEGATIVE NEGATIVE    Comment: (NOTE) The Xpert Xpress SARS-CoV-2/FLU/RSV plus assay is intended as an aid in the diagnosis of influenza from Nasopharyngeal swab specimens and should not be used as a sole basis for treatment. Nasal washings and aspirates are unacceptable for Xpert Xpress SARS-CoV-2/FLU/RSV testing.  Fact Sheet for  Patients: EntrepreneurPulse.com.au  Fact Sheet for Healthcare Providers: IncredibleEmployment.be  This test is not yet approved or cleared by the [place] FDA and has been authorized for detection and/or diagnosis of SARS-CoV-2 by FDA under an Emergency Use Authorization (EUA). This EUA will remain in effect (meaning this test can be used) for the duration of the COVID-19 declaration under Section 564(b)(1) of the Act, 21 U.S.C. section 360bbb-3(b)(1), unless the authorization is terminated or revoked.     Resp Syncytial Virus by PCR POSITIVE (A) NEGATIVE    Comment: (NOTE) Fact Sheet for Patients: EntrepreneurPulse.com.au  Fact Sheet for Healthcare Providers: IncredibleEmployment.be  This test is not yet approved or cleared by the [place] FDA and has been authorized for detection and/or diagnosis of SARS-CoV-2 by FDA under an Emergency Use Authorization (EUA). This EUA will remain in effect (meaning this test can be used) for the duration of the COVID-19 declaration under Section 564(b)(1) of the Act, 21 U.S.C. section 360bbb-3(b)(1), unless the authorization is terminated or revoked.  Performed at [facility], [address] [place], [place] [address]     Current Facility-Administered Medications  Medication Dose Route Frequency Provider Last Rate Last Admin   divalproex (DEPAKOTE) DR tablet 125 mg  125 mg Oral Q12H Patrecia Pour, NP   125 mg at 01/01/23 1200   No current outpatient medications on file.    Musculoskeletal: Strength & Muscle Tone: decreased Gait & Station:  did not witness Patient leans: N/A  Psychiatric Specialty Exam: Physical Exam Vitals and nursing note reviewed.  Constitutional:      Appearance: Normal appearance.  HENT:     Nose: Nose normal.  Pulmonary:     Effort: Pulmonary effort is normal.  Musculoskeletal:     Cervical back: Normal range  of motion.  Neurological:     General: No focal deficit present.     Mental Status: She is alert.  Psychiatric:  Attention and Perception: Attention and perception normal.        Mood and Affect: Affect is blunt.        Speech: Speech normal.        Thought Content: Thought content normal.        Cognition and Memory: Cognition is impaired. Memory is impaired.        Judgment: Judgment is inappropriate.     Review of Systems  Psychiatric/Behavioral:  Positive for memory loss.     Blood pressure (!) 150/78, pulse 80, temperature (!) 97.5 F (36.4 C), temperature source Oral, resp. rate 16, SpO2 95 %.There is no height or weight on file to calculate BMI.  General Appearance: Casual  Eye Contact:  Fair  Speech:  Clear and Coherent  Volume:  Normal  Mood:  Irritable at times  Affect:  Blunt  Thought Process:  Coherent  Orientation:  Other:  person  Thought Content:  UTA, minimal verbal interaction  Suicidal Thoughts:  No  Homicidal Thoughts:  No  Memory:  Immediate;   Poor Recent;   Poor Remote;   Poor  Judgement:  Poor  Insight:  Lacking  Psychomotor Activity:  Decreased  Concentration:  Concentration: Fair and Attention Span: Fair  Recall:  Poor  Fund of Knowledge:  Fair  Language:  Fair  Akathisia:  No  Handed:  Right  AIMS (if indicated):     Assets:  Leisure Time Resilience Social Support  ADL's:  Impaired  Cognition:  Impaired,  Moderate  Sleep:        Physical Exam: Physical Exam Vitals and nursing note reviewed.  Constitutional:      Appearance: Normal appearance.  HENT:     Nose: Nose normal.  Pulmonary:     Effort: Pulmonary effort is normal.  Musculoskeletal:     Cervical back: Normal range of motion.  Neurological:     General: No focal deficit present.     Mental Status: She is alert.  Psychiatric:        Attention and Perception: Attention and perception normal.        Mood and Affect: Affect is blunt.        Speech: Speech normal.         Thought Content: Thought content normal.        Cognition and Memory: Cognition is impaired. Memory is impaired.        Judgment: Judgment is inappropriate.    Review of Systems  Psychiatric/Behavioral:  Positive for memory loss.    Blood pressure (!) 150/78, pulse 80, temperature (!) 97.5 F (36.4 C), temperature source Oral, resp. rate 16, SpO2 95 %. There is no height or weight on file to calculate BMI.  Treatment Plan Summary: Dementia with behavioral disturbance: Started Depakote 125 mg BID Risperdal 0.5 mg BID PRN agitation TOC for placement or coordination of care  Disposition: Patient does not meet criteria for psychiatric inpatient admission. Supportive therapy provided about ongoing stressors.  Waylan Boga, NP 01/01/2023 1:16 PM

## 2023-01-01 NOTE — ED Notes (Signed)
Attempted to check on pt for wetness, Pt refused to allow this RN to check/change her getting very aggravated. Will inform next shift of difficulties and see if they have better luck.

## 2023-01-01 NOTE — ED Notes (Signed)
IVC/ Patient still not cooperative for consult due to meds

## 2023-01-01 NOTE — ED Notes (Signed)
This RN attempted to change pt and eval pt's ability to walk. Pt took new pants and placed them under blankets. Pt refused to remove old pants and constantly muttered incomprehensible words and told this RN "Get away, goodnight. Go away, turn the light off, goodnight." Clean pants removed from pt. Pt also refused to get out of bed, pulling sheets up and getting very agitated. Will try again at a later time.

## 2023-01-01 NOTE — ED Notes (Signed)
VOL/pending TOC consult

## 2023-01-01 NOTE — ED Notes (Signed)
Patient is vol pending Education officer, museum consult

## 2023-01-01 NOTE — ED Provider Notes (Signed)
Emergency Medicine Observation Re-evaluation Note  Janet Murphy is a 67 y.o. female, seen on rounds today.  Pt initially presented to the ED for complaints of IVC  Currently, the patient is is no acute distress. Denies any concerns at this time.  Physical Exam  Blood pressure 125/68, pulse 93, temperature (!) 97.2 F (36.2 C), resp. rate 20, SpO2 93 %.  Physical Exam: General: No apparent distress Pulm: Normal WOB Neuro: Moving all extremities Psych: Resting comfortably     ED Course / MDM     I have reviewed the labs performed to date as well as medications administered while in observation.  Recent changes in the last 24 hours include: No acute events overnight.  Plan   Current plan: Patient awaiting psychiatric disposition. Patient is under full IVC at this time.    Janet Murphy, Delice Bison, DO 01/01/23 [phone]

## 2023-01-01 NOTE — ED Notes (Signed)
Pt received breakfast tray and beverage at this time. 

## 2023-01-01 NOTE — Discharge Instructions (Signed)
Return to the ER for any new or worsening symptoms including confusion, change in mental status, aggressive behavior, harm to self or others.  Follow-up with the primary care doctor.   2. 1.1 cm incidental right thyroid nodule with heterogeneous and  enlarged thyroid. Recommend nonemergent outpatient thyroid  ultrasound if not previously performed.  Reference: J Am Coll Radiol. 2015 Feb;12(2): 143-50      Electronically Signed

## 2023-01-01 NOTE — ED Notes (Signed)
pt recieved snack and drink 

## 2023-01-01 NOTE — ED Notes (Signed)
Called pt's spouse Dominica Severin, to see if he would be able to pick up pt if she is DC'd, he stated not tonight but maybe tomorrow. Will need to call back to check availability due to him being sick and possibly going to [place] tomorrow.

## 2023-01-02 NOTE — Progress Notes (Addendum)
CSW contacted patient husband Dominica Severin [phone] to see if he would be willing to pick-up his wife today. Dominica Severin told CSW yes and he can pick her up anytime today. MD provider and RN notified.

## 2023-01-02 NOTE — ED Provider Notes (Addendum)
-----------------------------------------   1:53 PM on 01/02/2023 -----------------------------------------  The patient was cleared by psychiatry.  Social work talk to her husband who is willing to pick her up today and take her home.  She is stable for discharge at this time.  She is mildly tachycardic but this appears to be due to anxiety.  Return precautions provided.      Arta Silence, MD 01/02/23 1512

## 2023-01-02 NOTE — ED Notes (Signed)
Hospital meal provided, pt tolerated w/o complaints.  Waste discarded appropriately.  

## 2023-01-02 NOTE — ED Notes (Signed)
Patient began swinging at my arm when I tried to take vitals. Patient seem very confused and didn't want vitals taken at this time. Reported to [employer] we will attempt at another time.

## 2023-01-02 NOTE — ED Notes (Signed)
Pt discharged to home with husband, Dominica Severin. Husband given discharge instructions and verbalized understanding.

## 2023-01-02 NOTE — ED Notes (Signed)
Patient was cleaned up and bedding changed. Took four techs to assist the patient. Patient is very confused and had a hard time following directions. Vitals were taken, but patient would not hold still even with assistance.

## 2023-01-02 NOTE — ED Notes (Signed)
Tried to talk to pt, see if she needed anything, if she were dry ect. Pt responded with "go", "leave", "just go". Pt seems distressed, covering herself with the blanket and tucking it around herself, eyes moving all around, jerky motions. Unable to calm pt and my presence seems to be making situation worse. Told pt that if she needed anything just to let me know and I left room. The RN who took care of her yesterday said that pt had the same behavior.

## 2023-02-08 ENCOUNTER — Emergency Department
Admission: EM | Admit: 2023-02-08 | Discharge: 2023-06-22 | Disposition: A | Discharge status: Skilled Nursing Facility | Payer: Medicare HMO | Attending: Emergency Medicine | Admitting: Emergency Medicine

## 2023-02-08 ENCOUNTER — Other Ambulatory Visit: Payer: Self-pay

## 2023-02-08 DIAGNOSIS — F1290 Cannabis use, unspecified, uncomplicated: Secondary | ICD-10-CM | POA: Insufficient documentation

## 2023-02-08 DIAGNOSIS — R4182 Altered mental status, unspecified: Secondary | ICD-10-CM | POA: Diagnosis not present

## 2023-02-08 DIAGNOSIS — N390 Urinary tract infection, site not specified: Secondary | ICD-10-CM

## 2023-02-08 DIAGNOSIS — R918 Other nonspecific abnormal finding of lung field: Secondary | ICD-10-CM | POA: Diagnosis not present

## 2023-02-08 DIAGNOSIS — E041 Nontoxic single thyroid nodule: Secondary | ICD-10-CM | POA: Diagnosis not present

## 2023-02-08 DIAGNOSIS — F03918 Unspecified dementia, unspecified severity, with other behavioral disturbance: Secondary | ICD-10-CM | POA: Diagnosis not present

## 2023-02-08 DIAGNOSIS — F17210 Nicotine dependence, cigarettes, uncomplicated: Secondary | ICD-10-CM | POA: Insufficient documentation

## 2023-02-08 DIAGNOSIS — F03911 Unspecified dementia, unspecified severity, with agitation: Secondary | ICD-10-CM | POA: Insufficient documentation

## 2023-02-08 DIAGNOSIS — Z046 Encounter for general psychiatric examination, requested by authority: Secondary | ICD-10-CM

## 2023-02-08 LAB — COMPREHENSIVE METABOLIC PANEL
ALT: 15 U/L (ref 0–44)
AST: 23 U/L (ref 15–41)
Albumin: 4 g/dL (ref 3.5–5.0)
Alkaline Phosphatase: 79 U/L (ref 38–126)
Anion gap: 13 (ref 5–15)
BUN: 13 mg/dL (ref 8–23)
CO2: 20 mmol/L — ABNORMAL LOW (ref 22–32)
Calcium: 9.3 mg/dL (ref 8.9–10.3)
Chloride: 104 mmol/L (ref 98–111)
Creatinine, Ser: 0.64 mg/dL (ref 0.44–1.00)
GFR, Estimated: >60 mL/min (ref >60)
Glucose, Bld: 129 mg/dL — ABNORMAL HIGH (ref 70–99)
Potassium: 3.2 mmol/L — ABNORMAL LOW (ref 3.5–5.1)
Sodium: 137 mmol/L (ref 135–145)
Total Bilirubin: 0.4 mg/dL (ref 0.3–1.2)
Total Protein: 7.7 g/dL (ref 6.5–8.1)

## 2023-02-08 LAB — CBC
HCT: 40.5 % (ref 36.0–46.0)
Hemoglobin: 13.4 g/dL (ref 12.0–15.0)
MCH: 31.7 pg (ref 26.0–34.0)
MCHC: 33.1 g/dL (ref 30.0–36.0)
MCV: 95.7 fL (ref 80.0–100.0)
Platelets: 370 10*3/uL (ref 150–400)
RBC: 4.23 MIL/uL (ref 3.87–5.11)
RDW: 13.3 % (ref 11.5–15.5)
WBC: 10 10*3/uL (ref 4.0–10.5)
nRBC: 0 % (ref 0.0–0.2)

## 2023-02-08 LAB — ETHANOL: Alcohol, Ethyl (B): <10 mg/dL (ref <10)

## 2023-02-08 LAB — ACETAMINOPHEN LEVEL: Acetaminophen (Tylenol), Serum: <10 ug/mL — ABNORMAL LOW (ref 10–30)

## 2023-02-08 LAB — SALICYLATE LEVEL: Salicylate Lvl: <7 mg/dL — ABNORMAL LOW (ref 7.0–30.0)

## 2023-02-08 MED ORDER — OLANZAPINE 5 MG PO TABS
2.5000 mg | ORAL_TABLET | Freq: Two times a day (BID) | ORAL | Status: DC
  Administered 2023-02-08 – 2023-05-11 (×180): 2.5 mg via ORAL
  Filled 2023-02-08 (×188): qty 1

## 2023-02-08 NOTE — ED Notes (Signed)
Pt given warm blankets.

## 2023-02-08 NOTE — ED Notes (Signed)
PT  VOL  TOC  PLACEMENT

## 2023-02-08 NOTE — ED Notes (Signed)
IVC pending consult   

## 2023-02-08 NOTE — Consult Note (Signed)
[facility] Psychiatry Consult   Reason for Consult: dementia with behavioral disturbance  Referring Physician:  Archie Balboa Patient Identification: Janet Murphy MRN:  [id number] Principal Diagnosis: Dementia with behavioral disturbance ([place]) Diagnosis:  Principal Problem:   Dementia with behavioral disturbance ([place])   Total Time spent with patient: 30 minutes  Subjective:  "I'm sleeping" Janet Murphy is a 67 y.o. female patient admitted with behavioral issues.  HPI:  Patient presents to the ED under IVC, taken out by [place] Worker, [employer] [phone]). Patient has a long-standing history of neurocognitive decline, first diagnosed in 19.  Patient lives at home with husband; however, the [place] has taken guardianship of patient and patient is not going to be be able to return to her home.    On evaluation, patient is laying in bed, alert and does not respond to orientation questions. She only repeats "I'm asleep" and "Thank you."    Per [facility] Social Worker, Dellia Nims [phone]), they took guardianship of patient yesterday and "are looking for an out-of-home placement for her." Ms. Lissa Hoard states that she petitioned for IVC because patient t peed in the bed last night and would not let anyone clean it up and was aggressive when they tried. Reports patient is not eating or taking medicine.  Collateral from husband, Tommye Standard, [phone]: He reports that patient was released from [facility] after 15 days on Jan 2. and she was worse when she returned home. She did not know who he was, was disoriented to the house for several days. The hospital gave patient 15 days worth of medication (Zyprexa) and husband did not follow up with outpatient "because she will not go anywhere" He reports that all she wants to do is lay in her bed, listen to music and smoke marijuana. He said she has not had any marijuana in several days. He reports that  she sleeps intermittently, moves things around the house, She will act "psychotic", saying "he bit me" when no one is there. She refuses to go out of the home, so he does not bring her to the doctor for follow up. He reports that he cannot take her home.   Past Psychiatric History: Past history of Opioid Psychosis; Memory disorder; Dementia with behavioral disturbance; neurocognitive disorder  Risk to Self:   Risk to Others:   Prior Inpatient Therapy:   Prior Outpatient Therapy:    Past Medical History:  Past Medical History:  Diagnosis Date   Back pain    Shoulder pain     Past Surgical History:  Procedure Laterality Date   ABDOMINAL HYSTERECTOMY     partial    Family History:  Family History  Problem Relation Age of Onset   Arthritis Mother    Stroke Mother    Heart attack Father    Early death Son 41       car accident    Family Psychiatric  History:  Social History:  Social History   Substance and Sexual Activity  Alcohol Use No     Social History   Substance and Sexual Activity  Drug Use No    Social History   Socioeconomic History   Marital status: Married    Spouse name: Not on file   Number of children: Not on file   Years of education: Not on file   Highest education level: Not on file  Occupational History   Not on file  Tobacco Use   Smoking status:  Every Day    Packs/day: 0.50    Types: Cigarettes   Smokeless tobacco: Never  Vaping Use   Vaping Use: Never used  Substance and Sexual Activity   Alcohol use: No   Drug use: No   Sexual activity: Yes  Other Topics Concern   Not on file  Social History Narrative   Not on file   Social Determinants of Health   Financial Resource Strain: Not on file  Food Insecurity: Not on file  Transportation Needs: Not on file  Physical Activity: Not on file  Stress: Not on file  Social Connections: Not on file   Additional Social History:    Allergies:  No Known Allergies  Labs:  Results for  orders placed or performed during the hospital encounter of 02/08/23 (from the past 48 hour(s))  Comprehensive metabolic panel     Status: Abnormal   Collection Time: 02/08/23 12:29 PM  Result Value Ref Range   Sodium 137 135 - 145 mmol/L   Potassium 3.2 (L) 3.5 - 5.1 mmol/L   Chloride 104 98 - 111 mmol/L   CO2 20 (L) 22 - 32 mmol/L   Glucose, Bld 129 (H) 70 - 99 mg/dL    Comment: Glucose reference range applies only to samples taken after fasting for at least 8 hours.   BUN 13 8 - 23 mg/dL   Creatinine, Ser 0.64 0.44 - 1.00 mg/dL   Calcium 9.3 8.9 - 10.3 mg/dL   Total Protein 7.7 6.5 - 8.1 g/dL   Albumin 4.0 3.5 - 5.0 g/dL   AST 23 15 - 41 U/L   ALT 15 0 - 44 U/L   Alkaline Phosphatase 79 38 - 126 U/L   Total Bilirubin 0.4 0.3 - 1.2 mg/dL   GFR, Estimated >60 >60 mL/min    Comment: (NOTE) Calculated using the CKD-EPI Creatinine Equation (2021)    Anion gap 13 5 - 15    Comment: Performed at [facility], [address] [place], [place] [address]  Ethanol     Status: None   Collection Time: 02/08/23 12:29 PM  Result Value Ref Range   Alcohol, Ethyl (B) <10 <10 mg/dL    Comment: (NOTE) Lowest detectable limit for serum alcohol is 10 mg/dL.  For medical purposes only. Performed at [facility], [address] [place], [place] [id number]   Salicylate level     Status: Abnormal   Collection Time: 02/08/23 12:29 PM  Result Value Ref Range   Salicylate Lvl Q000111Q (L) 7.0 - 30.0 mg/dL    Comment: Performed at [facility], [address] [place], [place] [address]  Acetaminophen level     Status: Abnormal   Collection Time: 02/08/23 12:29 PM  Result Value Ref Range   Acetaminophen (Tylenol), Serum <10 (L) 10 - 30 ug/mL    Comment: (NOTE) Therapeutic concentrations vary significantly. A range of 10-30 ug/mL  may be an effective concentration for many patients. However, some  are best treated at concentrations outside of this range. Acetaminophen  concentrations >150 ug/mL at 4 hours after ingestion  and >50 ug/mL at 12 hours after ingestion are often associated with  toxic reactions.  Performed at [facility], [address] [place], [place] [address]   cbc     Status: None   Collection Time: 02/08/23 12:29 PM  Result Value Ref Range   WBC 10.0 4.0 - 10.5 K/uL   RBC 4.23 3.87 - 5.11 MIL/uL   Hemoglobin 13.4  12.0 - 15.0 g/dL   HCT 40.5 36.0 - 46.0 %   MCV 95.7 80.0 - 100.0 fL   MCH 31.7 26.0 - 34.0 pg   MCHC 33.1 30.0 - 36.0 g/dL   RDW 13.3 11.5 - 15.5 %   Platelets 370 150 - 400 K/uL   nRBC 0.0 0.0 - 0.2 %    Comment: Performed at [facility], [address] [place],  [address]    Current Facility-Administered Medications  Medication Dose Route Frequency Provider Last Rate Last Admin   OLANZapine (ZYPREXA) tablet 2.5 mg  2.5 mg Oral BID Waldon Merl F, NP       Current Outpatient Medications  Medication Sig Dispense Refill   OLANZapine (ZYPREXA) 2.5 MG tablet Take 2.5 mg by mouth 2 (two) times daily.      Musculoskeletal: Strength & Muscle Tone: within normal limits Gait & Station:  did not observe Patient leans: N/A     Psychiatric Specialty Exam:  Presentation  General Appearance:  Disheveled  Eye Contact: Fair  Speech: Clear and Coherent  Speech Volume: Normal  Handedness:No data recorded  Mood and Affect  Mood: -- (only says "I'm asleep.")  Affect: Inappropriate   Thought Process  Thought Processes: Disorganized  Descriptions of Associations:Loose  Orientation:None  Thought Content:Illogical  History of Schizophrenia/Schizoaffective disorder:No  Duration of Psychotic Symptoms:No data recorded Hallucinations:Hallucinations: -- (does not answer. Doe[place] not appea rto  be responding to internal stimuli)  Ideas of Reference:-- (UTA)  Suicidal Thoughts:Suicidal Thoughts: -- (does not answer questions, UTA)  Homicidal Thoughts:Homicidal  Thoughts: -- (UTA, Does not respond to question)   Sensorium  Memory: -- Pincus Badder)  Judgment:Impaired  Insight: Lacking   Executive Functions  Concentration: Poor  Attention Span: Poor  Recall: Poor  Fund of Knowledge: Poor  Language: Poor   Psychomotor Activity  Psychomotor Activity:Psychomotor Activity: Normal   Assets  Assets: Resilience   Sleep  Sleep:Sleep: Fair   Physical Exam: Physical Exam Vitals and nursing note reviewed.  HENT:     Head: Normocephalic.     Nose: No congestion or rhinorrhea.  Eyes:     General:        Right eye: No discharge.        Left eye: No discharge.  Cardiovascular:     Rate and Rhythm: Normal rate.  Pulmonary:     Effort: Pulmonary effort is normal.  Musculoskeletal:        General: Normal range of motion.     Cervical back: Normal range of motion.  Skin:    General: Skin is dry.  Neurological:     Mental Status: She is alert. She is disoriented.  Psychiatric:        Attention and Perception: She is inattentive.        Mood and Affect: Affect is inappropriate.        Cognition and Memory: Cognition is impaired. She exhibits impaired recent memory and impaired remote memory.        Judgment: Judgment is impulsive.     Comments: Patient has dementia. No meaningful conversation.     ROS Blood pressure (!) 189/112, pulse 94, SpO2 95 %. There is no height or weight on file to calculate BMI.  Treatment Plan Summary: Plan Patient has dementia with worsening behaviors. She is not medically cleared at time of evaluation. Patient does not meet criteria for psychiatric hospitalization. Patient has neurocognitive decline that is unlikely to improve with acute short-term psychiatric hospitalization. Patient has a  State DSS guardian and cannot return to her home with her husband.    TOC referral made. Reviewed with Dr. Quentin Cornwall  Disposition: No evidence of imminent risk to self or others at present.   Patient does not meet  criteria for psychiatric inpatient admission.  Sherlon Handing, NP 02/08/2023 5:42 PM

## 2023-02-08 NOTE — ED Notes (Signed)
Pt given water per request

## 2023-02-08 NOTE — ED Provider Notes (Signed)
[facility] Provider Note    Event Date/Time   First MD Initiated Contact with Patient 02/08/23 1240     (approximate)   History   IVC   HPI  Janet Murphy is a 67 y.o. female who presents to the emergency department under IVC.  Patient was brought in by [facility] department.  Apparently patient became or to the DSS yesterday.  When she was checked on today she was not taking care of herself.  Per chart review she has had ER visits for psychosis in the past.  Patient unable to give any significant history.     Physical Exam   Triage Vital Signs: ED Triage Vitals  Enc Vitals Group     BP 02/08/23 1234 (!) 189/112     Pulse Rate 02/08/23 1234 94     Resp --      Temp --      Temp src --      SpO2 02/08/23 1234 95 %     Weight --      Height --      Head Circumference --      Peak Flow --      Pain Score 02/08/23 1233 0     Pain Loc --      Pain Edu? --      Excl. in [place]? --     Most recent vital signs: Vitals:   02/08/23 1234  BP: (!) 189/112  Pulse: 94  SpO2: 95%   General: Awake, alert, not completely oriented. CV:  Good peripheral perfusion. Regular rate and rhythm. Resp:  Normal effort.  Abd:  No distention.     ED Results / Procedures / Treatments   Labs (all labs ordered are listed, but only abnormal results are displayed) Labs Reviewed  COMPREHENSIVE METABOLIC PANEL - Abnormal; Notable for the following components:      Result Value   Potassium 3.2 (*)    CO2 20 (*)    Glucose, Bld 129 (*)    All other components within normal limits  SALICYLATE LEVEL - Abnormal; Notable for the following components:   Salicylate Lvl Q000111Q (*)    All other components within normal limits  ACETAMINOPHEN LEVEL - Abnormal; Notable for the following components:   Acetaminophen (Tylenol), Serum <10 (*)    All other components within normal limits  ETHANOL  CBC  URINE DRUG SCREEN, QUALITATIVE ([facility] ONLY)      EKG  None   RADIOLOGY None   PROCEDURES:  Critical Care performed: No  Procedures   MEDICATIONS ORDERED IN ED: Medications - No data to display   IMPRESSION / MDM / [place] / ED COURSE  I reviewed the triage vital signs and the nursing notes.                              Differential diagnosis includes, but is not limited to, drug induced psychosis, psychiatric disease, dementia.  Patient's presentation is most consistent with acute presentation with potential threat to life or bodily function.  Patient presents to the emergency department today under IVC because of concerns for abnormal behavior and difficulty taking care of herself.  On exam patient is unable to give a good history.  Per chart review she does have history of psychiatric illness.  Will have psychiatry evaluate.  The patient has been placed in psychiatric observation due to the need to provide a  safe environment for the patient while obtaining psychiatric consultation and evaluation, as well as ongoing medical and medication management to treat the patient's condition.  The patient has been placed under full IVC at this time.      FINAL CLINICAL IMPRESSION(S) / ED DIAGNOSES   Final diagnoses:  Involuntary commitment   Note:  This document was prepared using Dragon voice recognition software and may include unintentional dictation errors.    Nance Pear, MD 02/08/23 [phone]

## 2023-02-08 NOTE — ED Triage Notes (Addendum)
Pt presents to ED with [place] with IVC paperwork stating pt is not eating or letting staff change soiled depends and is not eating properly. Pt is not giving this RN any information and just keeps mumbling words this RN cannot understand. Pt is cooperative and allowed this RN to change her.   [place] states DSS has guardianship.  Social Worker:  Dellia Nims [place]: [phone]  Pt belongings:  2 shirts  1 sweatshirt Red shoes Pink Leopard pants

## 2023-02-08 NOTE — ED Notes (Signed)
Patient was cleaned up and bedding changed. Clean and dry at this time. Took this EDT and RN Vet to assist the patient. Patient is very confused and had a hard time following directions. Vitals were taken, but patient would not hold still even with assistance Pt is repeating  shut the door over and over.

## 2023-02-08 NOTE — TOC Initial Note (Signed)
Transition of Care [facility]) - Initial/Assessment Note    Patient Details  Name: Janet Murphy MRN: [id number] Date of Birth: [birth year]  Transition of Care [facility]) CM/SW Contact:    Shelbie Hutching, RN Phone Number: 02/08/2023, 3:59 PM  Clinical Narrative:                 Patient IVC'd by  APS, she has not been on medications, smokes marijuana and has poor eating habits.  Per psychiatry she does not meet inpatient psych criteria.  Per Toula Moos at [facility] APS they will need and FL2 and TB test for placement.  Patient has dementia.  Her husband cannot care for her, DSS is guardian.    Expected Discharge Plan: Memory Care Barriers to Discharge: Continued Medical Work up   Patient Goals and CMS Choice Patient states their goals for this hospitalization and ongoing recovery are:: needs placement, can no longer be at home CMS Medicare.gov Compare Post Acute Care list provided to:: Legal Guardian Choice offered to / list presented to : [place] / Guardian      Expected Discharge Plan and Services   Discharge Planning Services: CM Consult   Living arrangements for the past 2 months: Single Family Home                 DME Arranged: N/A DME Agency: NA                  Prior Living Arrangements/Services Living arrangements for the past 2 months: Single Family Home Lives with:: Spouse Patient language and need for interpreter reviewed:: Yes Do you feel safe going back to the place where you live?: No   cannot return home  Need for Family Participation in Patient Care: Yes (Comment) Care giver support system in place?: Yes (comment) (DSS Legal Guardian)   Criminal Activity/Legal Involvement Pertinent to Current Situation/Hospitalization: No - Comment as needed  Activities of Daily Living      Permission Sought/Granted   Permission granted to share information with : Yes, Verbal Permission Granted              Emotional Assessment       Orientation: :  Fluctuating Orientation (Suspected and/or reported [place]) Alcohol / Substance Use: Illicit Drugs Psych Involvement: Yes (comment)  Admission diagnosis:  IVC Patient Active Problem List   Diagnosis Date Noted   Dementia with behavioral disturbance ([place]) 12/31/2022   Vitamin D deficiency 06/16/2017   B12 deficiency 06/16/2017   Underweight 06/15/2017   DDD (degenerative disc disease), thoracic 03/22/2014   PCP:  Angus Palms., MD Pharmacy:   [place], [place] [address] Phone: [phone] Fax: [phone]     Social Determinants of Health (SDOH) Social History: SDOH Screenings   Tobacco Use: High Risk (02/08/2023)   SDOH Interventions:     Readmission Risk Interventions     No data to display

## 2023-02-08 NOTE — ED Notes (Signed)
Pt given snack. 

## 2023-02-08 NOTE — ED Notes (Deleted)
IVC PAPERS  RESCINDED PER  LOUISE  NP  INFORMED  ANNIE  RN

## 2023-02-08 NOTE — ED Notes (Signed)
Pt cleaned and brief changed at this time, by [place], Therapist, sports and NT [employer].

## 2023-02-08 NOTE — ED Notes (Signed)
IVC PAPERS  RESCINDED  PER  LOUISE  NP  INFORMED  [place]  RN

## 2023-02-08 NOTE — ED Notes (Signed)
Pt just got out of bed, very confused. Redirected pt back to bed. ED tech Morgan, now sitting with pt.

## 2023-02-08 NOTE — ED Notes (Signed)
ED Tech Faith at the bedside as Air cabin crew

## 2023-02-09 DIAGNOSIS — E041 Nontoxic single thyroid nodule: Secondary | ICD-10-CM | POA: Diagnosis not present

## 2023-02-09 DIAGNOSIS — N390 Urinary tract infection, site not specified: Secondary | ICD-10-CM | POA: Diagnosis not present

## 2023-02-09 MED ORDER — TUBERCULIN PPD 5 UNIT/0.1ML ID SOLN
5.0000 [IU] | INTRADERMAL | Status: AC
  Administered 2023-02-09: 5 [IU] via INTRADERMAL
  Filled 2023-02-09: qty 0.1

## 2023-02-09 NOTE — TOC Progression Note (Signed)
Transition of Care [facility]) - Progression Note    Patient Details  Name: Janet Murphy MRN: [id number] Date of Birth: 09/01/1956  [facility]) CM/SW Contact  Shelbie Hutching, RN Phone Number: 02/09/2023, 11:32 AM  Clinical Narrative:    Patient has 24/7 sitters with [facility] at home, [place] [facility]PS has taken custody and will be working on placement.  Patient will benefit from Memory Care.  [facility] can assist with placement.    Expected Discharge Plan: Memory Care Barriers to Discharge: Continued Medical Work up  Expected Discharge Plan and Services   Discharge Planning Services: CM Consult   Living arrangements for the past 2 months: Single Family Home                 DME Arranged: N/A DME Agency: NA                   Social Determinants of Health (SDOH) Interventions SDOH Screenings   Tobacco Use: High Risk (02/08/2023)    Readmission Risk Interventions     No data to display

## 2023-02-09 NOTE — Discharge Instructions (Addendum)
Call your doctor to discuss the thyroid nodule found on imaging. Return to the ER for new or worsening symptoms.

## 2023-02-09 NOTE — ED Notes (Signed)
VOl pending TOC placement

## 2023-02-09 NOTE — ED Notes (Signed)
Patient attempting to get out of bed to shut door. Hailey, tech at bedside

## 2023-02-09 NOTE — ED Provider Notes (Signed)
Emergency Medicine Observation Re-evaluation Note  Physical Exam   BP (!) 164/75 (BP Location: Right Arm)   Pulse 73   Temp (!) 97.5 F (36.4 C) (Axillary)   Resp 17   SpO2 100%   Patient resting, unlabored breathing, no acute distress.  ED Course / MDM   No reported events during my shift at the time of this note.   Pt is awaiting dispo from SW   Lucillie Garfinkel MD    Lucillie Garfinkel, MD 02/09/23 [phone]

## 2023-02-09 NOTE — ED Notes (Addendum)
Patient up and ambulated to toilet NT changed patient brief with two person assistance. Patient continuously mutters same phrases over and over. "shut the door" "give me that pepsi" "hey jesus"

## 2023-02-09 NOTE — NC FL2 (Signed)
  [place] LEVEL OF CARE FORM     IDENTIFICATION  Patient Name: Janet Murphy Birthdate: [birth year] Sex: female Admission Date (Current Location): 02/08/2023  [facility] and [place] Number:  Engineering geologist and Address:  [facility], [address], [place], [place] [address]      Provider Number: [id number]  Attending Physician Name and Address:  No att. providers found  Relative Name and Phone Number:  Toula Moos- [place] APS [phone]    Current Level of Care: Hospital Recommended Level of Care: [place], Memory Care Prior Approval Number:    Date Approved/Denied:   PASRR Number:    Discharge Plan: Other (Comment) (ALF/Memory Care)    Current Diagnoses: Patient Active Problem List   Diagnosis Date Noted   Dementia with behavioral disturbance ([place]) 12/31/2022   Vitamin D deficiency 06/16/2017   B12 deficiency 06/16/2017   Underweight 06/15/2017   DDD (degenerative disc disease), thoracic 03/22/2014    Orientation RESPIRATION BLADDER Height & Weight        Normal Incontinent Weight:   Height:     BEHAVIORAL SYMPTOMS/MOOD NEUROLOGICAL BOWEL NUTRITION STATUS      Incontinent Diet (Regular)  AMBULATORY STATUS COMMUNICATION OF NEEDS Skin   Supervision Verbally Normal                       Personal Care Assistance Level of Assistance  Bathing, Feeding, Dressing Bathing Assistance: Limited assistance Feeding assistance: Limited assistance Dressing Assistance: Limited assistance     Functional Limitations Info  Sight, Hearing, Speech Sight Info: Adequate Hearing Info: Adequate Speech Info: Adequate    SPECIAL CARE FACTORS FREQUENCY                       Contractures Contractures Info: Not present    Additional Factors Info  Code Status Code Status Info: Full             Current Medications (02/09/2023):  This is the current hospital active medication list Current  Facility-Administered Medications  Medication Dose Route Frequency Provider Last Rate Last Admin   OLANZapine (ZYPREXA) tablet 2.5 mg  2.5 mg Oral BID Waldon Merl F, NP   2.5 mg at 02/09/23 [id number]   Current Outpatient Medications  Medication Sig Dispense Refill   OLANZapine (ZYPREXA) 2.5 MG tablet Take 2.5 mg by mouth 2 (two) times daily.       Discharge Medications: Please see discharge summary for a list of discharge medications.  Relevant Imaging Results:  Relevant Lab Results:   Additional Information    Shelbie Hutching, RN

## 2023-02-09 NOTE — ED Notes (Signed)
NT changed patient brief. Pt fighting with NT ripping off depend when trying to put a new one on needing at least two people to help change patient. Pt can ambulate well by self. Pt linens changed.

## 2023-02-09 NOTE — ED Notes (Signed)
Pt was cleaned and bedding changed with assistance by this writer, two RN'S and another EDT. Pt clean and dry at this time.

## 2023-02-09 NOTE — ED Notes (Signed)
Patient given crackers and water per patient request.

## 2023-02-09 NOTE — ED Notes (Signed)
Pt given graham crackers and water upon request.

## 2023-02-09 NOTE — ED Notes (Signed)
Pt woke up abruptly and attempted to exit the bed multiple times, setting the bed alarm off. Pt repositioned in bed by this tech for pts safety. Pt repeating "I am going to bed"

## 2023-02-09 NOTE — ED Notes (Addendum)
TB Skin test performed on patient with assist of other RNs to hold patient in place while injecting TB test in R forearm. Patient tolerated well.

## 2023-02-09 NOTE — ED Notes (Signed)
Linen, brief, and pants changed/ pt is agitated at this time and wishes to be left alone

## 2023-02-09 NOTE — ED Notes (Signed)
VOL/Pending TOC Placement

## 2023-02-09 NOTE — ED Notes (Signed)
New diaper, linens, and scrubs/clothing placed on patient due to incontinence.

## 2023-02-09 NOTE — TOC Progression Note (Signed)
Transition of Care [facility]) - Progression Note    Patient Details  Name: Janet Murphy MRN: [id number] Date of Birth: 09/29/1956  Transition of [facility]) CM/SW Contact  Shelbie Hutching, RN Phone Number: 02/09/2023, 1:38 PM  Clinical Narrative:    [place] emailed to Toula Moos with [place] DSS- they are working on placement at [facility].     Expected Discharge Plan: Memory Care Barriers to Discharge: Continued Medical Work up  Expected Discharge Plan and Services   Discharge Planning Services: CM Consult   Living arrangements for the past 2 months: Single Family Home                 DME Arranged: N/A DME Agency: NA                   Social Determinants of Health (SDOH) Interventions SDOH Screenings   Tobacco Use: High Risk (02/08/2023)    Readmission Risk Interventions     No data to display

## 2023-02-09 NOTE — ED Notes (Signed)
Attempted to get VS on patient, patient was pulling equipment off, trying to get out of bed. Unable to get VS at this time

## 2023-02-10 DIAGNOSIS — E041 Nontoxic single thyroid nodule: Secondary | ICD-10-CM | POA: Diagnosis not present

## 2023-02-10 DIAGNOSIS — N390 Urinary tract infection, site not specified: Secondary | ICD-10-CM | POA: Diagnosis not present

## 2023-02-10 MED ORDER — OLANZAPINE 5 MG PO TABS: 2.5000 mg | ORAL_TABLET | Freq: Two times a day (BID) | ORAL | Status: DC

## 2023-02-10 MED ORDER — MELATONIN 5 MG PO TABS
2.5000 mg | ORAL_TABLET | Freq: Every day | ORAL | Status: DC
  Administered 2023-02-10 – 2023-06-21 (×131): 2.5 mg via ORAL
  Filled 2023-02-10 (×133): qty 1

## 2023-02-10 NOTE — ED Notes (Signed)
This tech is ending one on one safety sitting. Gave report to on coming tech.

## 2023-02-10 NOTE — ED Notes (Signed)
Pt woke up from a nap and stated they were hungry. Pt reached for her leftover lunch tray. Tech placed the tray within reach of pt, pt ate a couple bites of meat, and carrots from the lunch tray. Pt continued to say " I don't feel good" RN was notified of the complaint from pt. Pt did allow tech to take her temperature which was 97.8. Pt did cooperate long enough to allow tech to put the BP cuff on the right arm but once it got tight pt stated that it was hurting. Pt then proceeded to remove the BP from arm. Pt is now resting in bed watching TV.

## 2023-02-10 NOTE — ED Notes (Signed)
RN from [employer] at bedside to assess pt.

## 2023-02-10 NOTE — ED Notes (Signed)
Pt given hospital breakfast tray. Pt ate 50%. This tech washed pt's face and hands. Attempted to comb pt's hair but was unable to be successful due to extremely matted hair. Tech asked pt if their brief was wet and attempted to look but Pt would not allow it and would not allow me to move covers off pt to check. Pt is rambeling short phrases repeatedly that don't make sense. Pt has no other needs at this time. Is in the bed with bed alarm on and a one to one sitter for safety.

## 2023-02-10 NOTE — ED Notes (Signed)
Pt has 1:1 continuous sitter present due to safety concerns. Pt disoriented x4.

## 2023-02-10 NOTE — ED Notes (Signed)
Pt fed multiple graham crackers on-by-one and provided a fresh cup of ice water. Pts bed alarm on, bed rails up to promote safety, and safety sitter at bedside within arms reach on pt.

## 2023-02-10 NOTE — ED Notes (Signed)
This tech took over sitting with pt from night tech. Pt is saying, "Go away you fucking alien" Keeps repeating, "give me a cookie" referring to wanting graham crackers. I give her one and she devours it like she hasn't ate in days. Pt is laying in bed thrashing around and repeating cuss words. Speech does not follow a thought process. Just random. Thinks something is biting her.

## 2023-02-10 NOTE — ED Notes (Signed)
Pt ate some of the lunch that was brought to them mainly the meat portion. Pt was reminded to slow down and chew food. Pt continued to mumble words/sentences. Tech wipes pt's hands as clean as possible with out agitating pt even more. Pt is resting in bed with the bed alarm in place.

## 2023-02-10 NOTE — ED Notes (Signed)
Pt assisted to bedside commode with EDT and RN. Pt urinated in toilet. Pt cleaned from head to toe. Cleaned scrubs and brief provided. Linen change done.

## 2023-02-10 NOTE — ED Notes (Signed)
This RN and EDT able to obtain pulse ox and HR. Staff still unable to obtain BP reading despite multiple attempts.

## 2023-02-10 NOTE — ED Notes (Signed)
Pt with strong urine smell. Pt refusing to let RN and EDT removed soiled clothes and place new pants and brief. Staff offering pt to ambulate to toilet for proper hygiene and changing of linens. Pt refused and continues cussing at staff. Pt states, "fuck you, I am going to pee in the bed fucker. Give me the pot." Pt still refuses for staff to obtain vitals

## 2023-02-10 NOTE — ED Notes (Signed)
This RN and EDT took soiled brief, pants and pad from pt. New bed pad in place. RN and EDT placed clean brief on pt and pt put clean scrub pants on.

## 2023-02-10 NOTE — ED Provider Notes (Signed)
-----------------------------------------   5:27 AM on 02/10/2023 -----------------------------------------   Blood pressure (!) 145/50, pulse 73, temperature 98.1 F (36.7 C), temperature source Axillary, resp. rate 18, SpO2 98 %.  The patient is calm and cooperative at this time.  There have been no acute events since the last update.  Awaiting disposition plan from Social Work team.   Paulette Blanch, MD 02/10/23 [phone]

## 2023-02-10 NOTE — ED Notes (Signed)
Pt repeating "Give me food". Graham crackers provided one by one due to pt shoving too many in her mouth at once. Pt acting as if this is the only food she has seen all day. Pt provided one cracker at a time and a new cracker was provided once the previous cracker was fully consumed. Pt given her cup of water in between crackers to prevent choking.

## 2023-02-11 DIAGNOSIS — E041 Nontoxic single thyroid nodule: Secondary | ICD-10-CM | POA: Diagnosis not present

## 2023-02-11 DIAGNOSIS — N390 Urinary tract infection, site not specified: Secondary | ICD-10-CM | POA: Diagnosis not present

## 2023-02-11 LAB — URINALYSIS, ROUTINE W REFLEX MICROSCOPIC
Bilirubin Urine: NEGATIVE
Glucose, UA: NEGATIVE mg/dL
Hgb urine dipstick: NEGATIVE
Ketones, ur: NEGATIVE mg/dL
Nitrite: NEGATIVE
Protein, ur: NEGATIVE mg/dL
RBC / HPF: >50 RBC/hpf (ref 0–5)
Specific Gravity, Urine: 1.019 (ref 1.005–1.030)
WBC, UA: >50 WBC/hpf (ref 0–5)
pH: 5 (ref 5.0–8.0)

## 2023-02-11 LAB — URINE DRUG SCREEN, QUALITATIVE (ARMC ONLY)
Amphetamines, Ur Screen: NOT DETECTED
Barbiturates, Ur Screen: NOT DETECTED
Benzodiazepine, Ur Scrn: NOT DETECTED
Cannabinoid 50 Ng, Ur ~~LOC~~: POSITIVE — AB
Cocaine Metabolite,Ur ~~LOC~~: NOT DETECTED
MDMA (Ecstasy)Ur Screen: NOT DETECTED
Methadone Scn, Ur: NOT DETECTED
Opiate, Ur Screen: NOT DETECTED
Phencyclidine (PCP) Ur S: NOT DETECTED
Tricyclic, Ur Screen: NOT DETECTED

## 2023-02-11 MED ORDER — ACETAMINOPHEN 325 MG PO TABS
650.0000 mg | ORAL_TABLET | Freq: Once | ORAL | Status: AC
  Administered 2023-02-11: 650 mg via ORAL
  Filled 2023-02-11: qty 2

## 2023-02-11 NOTE — ED Notes (Signed)
VOL/pending TOC placement 

## 2023-02-11 NOTE — ED Notes (Signed)
VOL/pending placement 

## 2023-02-11 NOTE — ED Notes (Signed)
Pt husband called for an update. RN answered pt husband questions. Pt husband states he will probably come visit her today.

## 2023-02-11 NOTE — ED Notes (Signed)
Assumed care from [place]. Pt resting comfortably in bed at this time.1:1 sitter at bedside.

## 2023-02-11 NOTE — ED Notes (Signed)
Husband at bedside.  

## 2023-02-11 NOTE — ED Notes (Signed)
Breakfast tray was given.

## 2023-02-11 NOTE — ED Notes (Signed)
RN and tech assessed to ensure patient did not have a bowel movement or urination. Pt pants and pad dry at this time.

## 2023-02-11 NOTE — ED Provider Notes (Signed)
-----------------------------------------   5:24 AM on 02/11/2023 -----------------------------------------   Blood pressure (!) 145/50, pulse 77, temperature 97.8 F (36.6 C), temperature source Oral, resp. rate 20, SpO2 95 %.  The patient is calm and cooperative at this time.  There have been no acute events since the last update.  Awaiting disposition plan from Social Work team.   Paulette Blanch, MD 02/11/23 [phone]

## 2023-02-11 NOTE — ED Notes (Signed)
Pt complaining of back pain. RN notified MD Jari Pigg for pain medications.

## 2023-02-11 NOTE — TOC Progression Note (Signed)
Transition of Care [facility]) - Progression Note    Patient Details  Name: Janet Murphy MRN: [id number] Date of Birth: [birth year]  Transition of Care [facility]) CM/SW Contact  Shelbie Hutching, RN Phone Number: 02/11/2023, 3:13 PM  Clinical Narrative:    Received an update from Toula Moos with [place] [phone]- she said that [facility] thinks she needs her medications adjusted.  RNCM did explain that patient has been calm, she has very advanced dementia.  She does not qualify for geriatric psych admission.     Expected Discharge Plan: Memory Care Barriers to Discharge: Continued Medical Work up  Expected Discharge Plan and Services   Discharge Planning Services: CM Consult   Living arrangements for the past 2 months: Single Family Home                 DME Arranged: N/A DME Agency: NA                   Social Determinants of Health (SDOH) Interventions SDOH Screenings   Tobacco Use: High Risk (02/08/2023)    Readmission Risk Interventions     No data to display

## 2023-02-12 DIAGNOSIS — E041 Nontoxic single thyroid nodule: Secondary | ICD-10-CM | POA: Diagnosis not present

## 2023-02-12 DIAGNOSIS — N390 Urinary tract infection, site not specified: Secondary | ICD-10-CM | POA: Diagnosis not present

## 2023-02-12 NOTE — ED Notes (Signed)
VOL/Pending Placement 

## 2023-02-12 NOTE — ED Notes (Signed)
Patient took po medications crushed in applesauce, she repeats self over and over, sitter is at the bedside.

## 2023-02-12 NOTE — ED Notes (Signed)
Pt given nighttime snack. 

## 2023-02-12 NOTE — ED Notes (Signed)
UNABLE TO OBTAINED VS AT THIS MOMENT. PT WAS VERY COMBATIVE AND REMOVED B/P AFTER MULTIPLES TIMES BEING ATTEMPTED TO GET VS.

## 2023-02-12 NOTE — ED Notes (Signed)
Patient was soiled, and she was cleaned and changed per Staff, she did not understand that staff was trying to help her and was very restless, will continue to monitor for safety. Sitter at the bedside.

## 2023-02-12 NOTE — ED Notes (Signed)
BREAKFAST TRAY GIVEN.

## 2023-02-12 NOTE — ED Notes (Signed)
Patient is English as a second language teacher, sitter at bedside, staff will continue to monitor.

## 2023-02-12 NOTE — ED Notes (Signed)
Patient is vol pending placement 

## 2023-02-12 NOTE — ED Provider Notes (Signed)
-----------------------------------------   6:17 AM on 02/12/2023 -----------------------------------------   Blood pressure (!) 166/116, pulse 88, temperature 97.7 F (36.5 C), resp. rate 20, SpO2 98 %.  The patient is calm and cooperative at this time.  There have been no acute events since the last update.  Awaiting disposition plan from case management/social work.    Sajan Cheatwood, Delice Bison, DO 02/12/23 [phone]

## 2023-02-13 DIAGNOSIS — N390 Urinary tract infection, site not specified: Secondary | ICD-10-CM | POA: Diagnosis not present

## 2023-02-13 DIAGNOSIS — E041 Nontoxic single thyroid nodule: Secondary | ICD-10-CM | POA: Diagnosis not present

## 2023-02-13 NOTE — ED Notes (Signed)
Pt has family at bedside visiting at this time. Pt denies any current needs.

## 2023-02-13 NOTE — ED Notes (Signed)
Pt given dinner tray. 1:1 sitter at bedside

## 2023-02-13 NOTE — ED Notes (Signed)
Safety sitter remains at bedside. 

## 2023-02-13 NOTE — ED Notes (Signed)
VOL/pending TOC placement 

## 2023-02-13 NOTE — ED Notes (Signed)
Pt complaining of not feeling good and stating that she is sick. Pt also states left and right hip pain. Will inform MD. Vitals obtained at this time as well.

## 2023-02-13 NOTE — ED Notes (Signed)
1:1 Sitter EDT Maria at bedside with pt at this time.

## 2023-02-13 NOTE — ED Notes (Addendum)
Pt's brief and bed pad were changed, pt clean and dry

## 2023-02-13 NOTE — ED Notes (Signed)
Pt given breakfast tray and drink at this time.

## 2023-02-13 NOTE — ED Notes (Signed)
Pt refusing to let staff obtain blood pressure, but let this RN get her pulse ox and heart rate.

## 2023-02-13 NOTE — ED Notes (Signed)
Visitor at bedside.

## 2023-02-13 NOTE — ED Provider Notes (Signed)
-----------------------------------------   6:48 AM on 02/13/2023 -----------------------------------------   Blood pressure (!) 210/168, pulse (!) 129, temperature 97.7 F (36.5 C), resp. rate 20, SpO2 98 %.  The patient is calm and cooperative at this time.  There have been no acute events since the last update.  Awaiting disposition plan from [facility] team.   Hinda Kehr, MD 02/13/23 [phone]

## 2023-02-13 NOTE — ED Notes (Signed)
Pt given lunch tray and eating at this time. 1:1 sitter at bedside

## 2023-02-13 NOTE — ED Notes (Signed)
TOC messaged regarding status on pt and update.

## 2023-02-13 NOTE — ED Notes (Signed)
Attempted to take pt's BP and other vitals. Pt ripped Bp cuff off and became agitated. Will try again later. 1:1 Sitter Maria at bedside getting pt's breakfast tray set up for pt. Pt continues to be confused and unable to understand what pt is saying.

## 2023-02-13 NOTE — ED Notes (Signed)
With assistance pt had some water

## 2023-02-13 NOTE — ED Provider Notes (Signed)
Reevaluated patient given there was a report that patient was "not feeling well"  Vitals are stable will get a urine.  She is moving all extremities according to staff she has been acting at her baseline self abdomen soft and nontender.   Vanessa Atwater, MD 02/13/23 [phone]

## 2023-02-13 NOTE — ED Notes (Signed)
Pt is currently asleep at this time, will retreive VS and give snack at a later time

## 2023-02-14 DIAGNOSIS — E041 Nontoxic single thyroid nodule: Secondary | ICD-10-CM | POA: Diagnosis not present

## 2023-02-14 DIAGNOSIS — N390 Urinary tract infection, site not specified: Secondary | ICD-10-CM | POA: Diagnosis not present

## 2023-02-14 MED ORDER — CEPHALEXIN 500 MG PO CAPS
500.0000 mg | ORAL_CAPSULE | Freq: Two times a day (BID) | ORAL | Status: AC
  Administered 2023-02-14 – 2023-02-20 (×14): 500 mg via ORAL
  Filled 2023-02-14 (×14): qty 1

## 2023-02-14 NOTE — ED Notes (Signed)
Pt changed by NT paige at this time. New brief & peri-care performed by tech

## 2023-02-14 NOTE — ED Notes (Signed)
Pt had extended episode of making chicken sounds and attempting to get out of bed with 1 to 1 sitter at bedside. Pt looked at this nurse when stepped to room, pointed at nurse and showed this nurse her middle finger. Pt continued for some time but calms eventually without intervention being needed past verbal.

## 2023-02-14 NOTE — TOC Progression Note (Signed)
Transition of Care [facility]) - Progression Note    Patient Details  Name: Janet Murphy MRN: [id number] Date of Birth: 24-Jan-1956  Transition of Care [facility]) CM/SW [place], [place] Phone Number: 02/14/2023, 12:08 PM  Clinical Narrative:     Per Toula Moos with DSS:   [place] did an assessment and stated that they feel patient needs more medications based on their assessment.   The concern is the behaviors are still the same as they were when she was in the home and requires a sitter (things that cannot be provided at a facility).   [place] will take her when there is a change in behaviors.    Expected Discharge Plan: Memory Care Barriers to Discharge: Continued Medical Work up  Expected Discharge Plan and Services   Discharge Planning Services: CM Consult   Living arrangements for the past 2 months: Single Family Home                 DME Arranged: N/A DME Agency: NA                   Social Determinants of Health (SDOH) Interventions SDOH Screenings   Tobacco Use: High Risk (02/08/2023)    Readmission Risk Interventions     No data to display

## 2023-02-14 NOTE — ED Notes (Signed)
Hospital meal provided, pt tolerated w/o complaints.  Waste discarded appropriately.  

## 2023-02-14 NOTE — ED Notes (Signed)
Pt has 1 to 1 sitter for safety, bed in lowest position, bed alarm in place and working. Pt continues to attempt to get up from bed. Will not speak to nurse past simple words.

## 2023-02-14 NOTE — ED Notes (Signed)
Pt incontinent of urine at this time. Pt cleaned with peri wipes and clean brief placed on pt. Pt given saltine crackers at this time and a cup of water.

## 2023-02-14 NOTE — ED Provider Notes (Signed)
-----------------------------------------   6:54 AM on 02/14/2023 -----------------------------------------   Blood pressure (!) 150/114, pulse 90, temperature 98.7 F (37.1 C), resp. rate 17, SpO2 98 %.  The patient is calm and cooperative at this time.  There have been no acute events since the last update.  Awaiting disposition plan from case management/social work.   Patient's urinalysis from 2 days ago appeared infected.  She is incontinent of urine and we have been unable to recollect.  Will add on culture to previous collection if possible and can start treating her with Keflex twice daily for the next week.   Shayonna Ocampo, Delice Bison, DO 02/14/23 [phone]

## 2023-02-15 DIAGNOSIS — E041 Nontoxic single thyroid nodule: Secondary | ICD-10-CM | POA: Diagnosis not present

## 2023-02-15 DIAGNOSIS — N390 Urinary tract infection, site not specified: Secondary | ICD-10-CM | POA: Diagnosis not present

## 2023-02-15 NOTE — ED Notes (Signed)
Report to Ashley, RN

## 2023-02-15 NOTE — ED Notes (Signed)
Delphia Grates and [place], Tech performed [employer] for pt. Complete bed change and cleaned of void. Pt now dry and in position of comfort. 1 to 1 still present. Bed in lowest position and bed alarm on.

## 2023-02-15 NOTE — ED Notes (Signed)
Breakfast tray given to patient.

## 2023-02-15 NOTE — ED Notes (Signed)
Hospital meal provided.  100% consumed, pt tolerated w/o complaints.  Waste discarded appropriately.   

## 2023-02-15 NOTE — ED Notes (Signed)
Janet Murphy, Tech and [place], Tech cleaned pt of urine and stool. Complete bed changer performed. Bed in lowest position, bed alarm on, and 1:1 sitter continued.

## 2023-02-15 NOTE — ED Notes (Signed)
Pt incontinent of stool. Pt cleaned with peri wipes, clean gown and brief placed on pt, and bed linen changed at this time. Warm blankets placed on pt. Pt had no further needs at this time. This tech sitting 1:1 with pt.

## 2023-02-15 NOTE — ED Provider Notes (Signed)
-----------------------------------------   7:49 AM on 02/15/2023 -----------------------------------------   Blood pressure 131/70, pulse 83, temperature 98.1 F (36.7 C), temperature source Oral, resp. rate 16, SpO2 98 %.  The patient is calm and cooperative at this time.  There have been no acute events since the last update.  Awaiting disposition plan from [facility] team.   Hinda Kehr, MD 02/15/23 [phone]

## 2023-02-15 NOTE — ED Notes (Signed)
Pt dirty brief changed by staff. New linens provided. Pt clean and dry at this time.

## 2023-02-15 NOTE — ED Notes (Signed)
Report given to Kim, RN.

## 2023-02-15 NOTE — ED Notes (Signed)
Dinner given

## 2023-02-15 NOTE — ED Notes (Signed)
VOL TOC placement

## 2023-02-15 NOTE — ED Notes (Signed)
Pt dirty brief changed by staff. Pt clean and dry at this time.

## 2023-02-15 NOTE — ED Notes (Signed)
Brief changed ?

## 2023-02-16 DIAGNOSIS — E041 Nontoxic single thyroid nodule: Secondary | ICD-10-CM | POA: Diagnosis not present

## 2023-02-16 DIAGNOSIS — N390 Urinary tract infection, site not specified: Secondary | ICD-10-CM | POA: Diagnosis not present

## 2023-02-16 NOTE — ED Notes (Signed)
Pt husband at the bedside

## 2023-02-16 NOTE — ED Notes (Addendum)
Dinner and beverage provided. 50% of meal consumed. Pt requesting lights to be turned off and door shut. Pt reminded that the door can not be shut; provided for pt comfort and safety.

## 2023-02-16 NOTE — ED Notes (Signed)
Hospital meal provided.  100% consumed, pt tolerated w/o complaints.  Waste discarded appropriately.   

## 2023-02-16 NOTE — ED Notes (Signed)
Pt incontinent of urine. Pt cleaned and new brief placed on pt. Pt given warm blankets.

## 2023-02-16 NOTE — ED Notes (Signed)
Pt brief changed.  

## 2023-02-16 NOTE — ED Notes (Signed)
Pt has stripped herself of gown and is naked in bed with blankets covering her.

## 2023-02-16 NOTE — ED Provider Notes (Signed)
-----------------------------------------   6:07 AM on 02/16/2023 -----------------------------------------   Blood pressure 131/70, pulse 83, temperature 98.1 F (36.7 C), temperature source Oral, resp. rate 16, SpO2 98 %.  The patient is calm and cooperative at this time.  There have been no acute events since the last update.  Awaiting disposition plan from [facility] team.   Hinda Kehr, MD 02/16/23 [phone]

## 2023-02-16 NOTE — ED Notes (Signed)
vol/toc placement.Marland Kitchen

## 2023-02-16 NOTE — ED Notes (Addendum)
Pt had a BM in brief. Pt cleaned and a new brief and gown were placed on pt at this time. Bed linen was also changed at this time and warm blankets given to pt.

## 2023-02-16 NOTE — ED Notes (Signed)
VOL/Still Pending TOC Placement

## 2023-02-16 NOTE — ED Notes (Signed)
Pt assisted with eating graham crackers and provided with a cup of water at this time.

## 2023-02-16 NOTE — ED Notes (Addendum)
Pt had BM and urinated on self  in bed. Pt cleaned up by this RN, EDT Mykia and RN Seth Bake. Pt linens changed, pt cleaned up and was trying to put poop on the staff. Pt placed finger in butthole and tried to smear it on staff. Pt placed in clean brief and gown. Pt given warm blankets. Room cleaned and EVS called to come and mop floor.

## 2023-02-16 NOTE — ED Notes (Signed)
Pt incontinent of stool. Pt cleaned and a clean brief was placed on pt. Bed linen changed as well as pt gown and warm blankets given to pt.

## 2023-02-17 MED ORDER — OLANZAPINE 5 MG PO TBDP
5.0000 mg | ORAL_TABLET | Freq: Once | ORAL | Status: AC
  Administered 2023-02-17: 5 mg via ORAL
  Filled 2023-02-17: qty 1

## 2023-02-17 NOTE — ED Notes (Signed)
Hospital meal provided.  100% consumed, pt tolerated w/o complaints.  Waste discarded appropriately.   

## 2023-02-17 NOTE — ED Notes (Signed)
Pt urinated in brief. Pt cleaned and a clean brief placed on pt. Clean bed linens placed on bed and pt provided with warm blankets.

## 2023-02-17 NOTE — ED Notes (Signed)
Pt has continued to pull at her brief and blanket and in not consolable. Intermittently up and down, lisa, NT still at bedside. Pt was helped with eating graham crackers and drinking water

## 2023-02-17 NOTE — ED Notes (Signed)
Pt has brief changed at this time by this nurse and lisa, Tech

## 2023-02-17 NOTE — ED Notes (Signed)
Pt has continued with agitated behaviors, yelling out. Water, crackers, coke, blankets, tv, door closed have all been provided and performed. No relief

## 2023-02-17 NOTE — ED Notes (Signed)
Pt clean and dry at this time.

## 2023-02-17 NOTE — ED Notes (Signed)
Incontinent of urine, sheets and brief changed. Pericare provided.

## 2023-02-17 NOTE — ED Notes (Signed)
Vol / pending TOC placement

## 2023-02-18 DIAGNOSIS — N390 Urinary tract infection, site not specified: Secondary | ICD-10-CM | POA: Diagnosis not present

## 2023-02-18 DIAGNOSIS — E041 Nontoxic single thyroid nodule: Secondary | ICD-10-CM | POA: Diagnosis not present

## 2023-02-18 MED ORDER — OLANZAPINE 5 MG PO TABS
2.5000 mg | ORAL_TABLET | ORAL | Status: AC
  Administered 2023-02-18: 2.5 mg via ORAL
  Filled 2023-02-18: qty 1

## 2023-02-18 NOTE — ED Notes (Signed)
VOL TOC  PLACEMENT

## 2023-02-18 NOTE — ED Notes (Signed)
Pt continuously taking off brief then saying "Im wet" Pt then throwing brief on floor. This Probation officer checked pt at this time she is clean and dry.

## 2023-02-18 NOTE — ED Notes (Signed)
Pt incontinent of urine. Pt cleaned and a new brief placed on pt. Bed linens also changed.

## 2023-02-18 NOTE — ED Notes (Addendum)
Pt assisted with eating dinner tray. Pt consumed approximately 75% of meal tray.

## 2023-02-18 NOTE — ED Notes (Signed)
VOL/pending placement 

## 2023-02-18 NOTE — ED Notes (Addendum)
Pt in ED stretcher in room w/ sitter at bedside. Pt is alert, confused, NAD noted.

## 2023-02-18 NOTE — ED Notes (Signed)
Pt continuously taking brief off stating "I'm wet", and then putting brief back on self. This Probation officer checked brief and brief is clean and dry at this time. Writer helped pt place brief back on correctly.

## 2023-02-18 NOTE — ED Notes (Signed)
Pts brief changed at this time due to urinary incontinence. Pt denies needing anything at this time. Safety sitter at bedside. Bed rails up and bed alarm on.

## 2023-02-18 NOTE — ED Notes (Addendum)
Pt attempting to get out of bed and restless. Mittens make pt more agitated per sitter. EDP notified.

## 2023-02-18 NOTE — ED Notes (Signed)
This tech helped pt take off brief. Pt helped lift up bottom to change brief with this tech. Clean brief has just been applied.

## 2023-02-18 NOTE — ED Notes (Signed)
Pt throwing blankets and pads on the floor. This EDT tried to get VS on pt. Not able to at this time RN notified.

## 2023-02-18 NOTE — ED Notes (Signed)
Pt digging in brief- pt cleaned and protective mittens applied. Pt assisted w/ lunch tray.

## 2023-02-18 NOTE — ED Notes (Signed)
Pt digging in brief, stool on hands. Pt cleaned of small amount of stool, new gown and brief applied, hands cleaned.

## 2023-02-18 NOTE — ED Notes (Signed)
Pt provided with hospital dinner tray.

## 2023-02-18 NOTE — ED Notes (Signed)
Pt encouraged to sit up to take PO meds from RN. Pt stated "cracker". Pt provided graham crackers at this time after taking meds. Graham crackers provided in single bites to prevent choking hazards.   Entire bed, blankets, gown, and chux changed due to pt spoiling them. Clean sheet, 2 chux, brief, gown, and blankets provided.

## 2023-02-18 NOTE — ED Notes (Signed)
Pt incontinent of urine and stool. Pt cleaned and a new brief and gown placed on pt. Bed linens also changed and pt provided with warm blankets.

## 2023-02-18 NOTE — ED Notes (Signed)
Pt urinated in Brief. Pt cleaned and clean brief and gown placed on pt. Pt provided with warm blankets.

## 2023-02-18 NOTE — ED Provider Notes (Signed)
Emergency Medicine Observation Re-evaluation Note  Janet Murphy is a 67 y.o. female, seen in the emergency department for psychiatric complaint.  No acute events since last update.  Physical Exam  BP (!) 141/98   Pulse 91   Temp 97.9 F (36.6 C) (Oral)   Resp 16   SpO2 98%   ED Course / MDM   No recent labs for review  Plan  Current plan is for placement to an appropriate living facility once available.Harvest Dark, MD 02/18/23 [phone]

## 2023-02-18 NOTE — ED Notes (Signed)
Pt urinated in brief. Pt cleaned and clean brief placed on pt.

## 2023-02-18 NOTE — ED Notes (Signed)
Pt husband at bedside

## 2023-02-18 NOTE — ED Notes (Signed)
Patient is resting comfortably. 

## 2023-02-19 DIAGNOSIS — E041 Nontoxic single thyroid nodule: Secondary | ICD-10-CM | POA: Diagnosis not present

## 2023-02-19 DIAGNOSIS — N390 Urinary tract infection, site not specified: Secondary | ICD-10-CM | POA: Diagnosis not present

## 2023-02-19 MED ORDER — LORAZEPAM 1 MG PO TABS
1.0000 mg | ORAL_TABLET | Freq: Four times a day (QID) | ORAL | Status: DC | PRN
  Administered 2023-02-19 – 2023-06-22 (×117): 1 mg via ORAL
  Filled 2023-02-19 (×121): qty 1

## 2023-02-19 MED ORDER — OLANZAPINE 5 MG PO TBDP
5.0000 mg | ORAL_TABLET | Freq: Once | ORAL | Status: AC
  Administered 2023-02-19: 5 mg via ORAL
  Filled 2023-02-19: qty 1

## 2023-02-19 NOTE — ED Notes (Signed)
Pt encouraged to sit up and eat dinner while it was warm. Pt sat up in bed and this tech fed pt with a spoon, offering sips of drink in between bites. Pt consumed roughly half of spaghetti dinner before stating "I'm full" then "I'm gonna sleep". Pts lights turned out and bed rails up for safety. Safety sitter at bedside.

## 2023-02-19 NOTE — ED Notes (Signed)
Pt getting more agitated, got out of bed and trying to leave room. Redirected but keeps getting out of bed. Per 1:1 sitter, pt's digging in the trash trying to get food. Pt already ate her breakfast.

## 2023-02-19 NOTE — ED Provider Notes (Signed)
Emergency Medicine Observation Re-evaluation Note  Janet Murphy is a 67 y.o. female, seen on rounds today.  Pt initially presented to the ED for complaints of IVC  Currently, the patient is calm, no acute complaints.  Physical Exam  Blood pressure (!) 133/90, pulse 92, temperature 98.4 F (36.9 C), temperature source Oral, resp. rate 20, SpO2 98 %. Physical Exam General: NAD Lungs: CTAB Psych: not agitated  ED Course / MDM  EKG:    I have reviewed the labs performed to date as well as medications administered while in observation.  Recent changes in the last 24 hours include no acute events overnight.    Plan  Current plan is for [facility] placement   Carrie Mew, MD 02/19/23 2116

## 2023-02-19 NOTE — ED Notes (Signed)
Patient is vol pending TOC placement

## 2023-02-19 NOTE — ED Notes (Signed)
Full linen and brief changed by this EDT and Charitee, EDT as pt soiled brief.

## 2023-02-19 NOTE — ED Notes (Signed)
Pts food tray arrived. Tech offered food tray to pt. Pt said "no" and rolled over to continue napping. Will offer food tray to pt again at a later time.

## 2023-02-19 NOTE — ED Notes (Signed)
Assumed care of patient. Patient is resting in bed. Sitter at bedside.

## 2023-02-19 NOTE — ED Notes (Signed)
Pt provided with graham crackers per request.

## 2023-02-20 NOTE — ED Notes (Signed)
Pt soiled with urine, this RN and Mel RN at bedside at his time. New brief and bed sheet, pt clean and dry at this time.

## 2023-02-20 NOTE — ED Notes (Signed)
Patient is eating breakfast at this time.

## 2023-02-20 NOTE — ED Notes (Signed)
Pt continually trying to get out of bed. Pts brief changed again due to pt urinating once more. Pt provided meds with graham crackers. Pt sat upright to facilitate safety while eating. Bed rails srtill up and bed alarm in proper position underneath pt. Safety sitter at bedside.

## 2023-02-20 NOTE — ED Notes (Signed)
Pt ate small amount of lunch and threw lunch on floor. Pt removed brief and threw on floor. Food removed from floor and discarded. New brief placed on pt. Light turned off per pt request. No other needs at this time.

## 2023-02-20 NOTE — ED Notes (Signed)
Pt's brief was charged by Probation officer.

## 2023-02-20 NOTE — ED Notes (Signed)
Patient resting comfortably in bed at this time with eyes closed. Respirations even and unlabored.

## 2023-02-20 NOTE — ED Notes (Signed)
Pt assisted with eating applesauce.

## 2023-02-20 NOTE — ED Notes (Signed)
Pt very restless and trying to get out of bed.

## 2023-02-20 NOTE — ED Notes (Signed)
Pt heard repeating "I just peed the bed". This tech checked pts brief. Pt scrub pants and brief were soiled. Pts bowel movement cleaned up and new brief applied. Clean chux placed under pt at this time. Bed rails and be alarm on to promote a safe environment. Safety sitter remains at bedside.

## 2023-02-20 NOTE — ED Notes (Signed)
Pt had bowel movement in brief, took brief off and put it back on inside out. Pt cleaned, placed in new brief, and bed linens changed by this RN and tech.

## 2023-02-20 NOTE — ED Notes (Signed)
Pt woke up stating "I just peed the bed". This tech changed pts brief and chux due to pt soiling them. Pericare provided to get the poop and pee off the pts bottom and legs. Pt repositioned in bed, bed rails up, and bed alarm on for the pts safety. Safety sitter still at bedside.

## 2023-02-20 NOTE — ED Notes (Signed)
Vol /pending TOC placement

## 2023-02-20 NOTE — ED Notes (Signed)
Patient was given dinner tray at this time.

## 2023-02-20 NOTE — ED Notes (Signed)
Changed patient after urinated in bed. Patient has thrown her breakfast on the floor, Rn is aware and has ordered another breakfast tray.

## 2023-02-20 NOTE — ED Notes (Signed)
VOL/pending TOC placement 

## 2023-02-21 DIAGNOSIS — E041 Nontoxic single thyroid nodule: Secondary | ICD-10-CM | POA: Diagnosis not present

## 2023-02-21 DIAGNOSIS — N390 Urinary tract infection, site not specified: Secondary | ICD-10-CM | POA: Diagnosis not present

## 2023-02-21 NOTE — ED Notes (Signed)
Pt agitated, kicked EDT Makiya in stomach,

## 2023-02-21 NOTE — ED Notes (Signed)
Writer had spend 3 hours demitting pt's hair.

## 2023-02-21 NOTE — ED Notes (Signed)
Patient is currently resting at this time.

## 2023-02-21 NOTE — ED Notes (Signed)
Pt's husband is currently at the bedside requesting to speak to those involved in getting patient placed. Pt's husband states that she appears to be doing a lot better than she was when she got here, and thinks there is a possibility of him taking her home. Social worker Kelby Fam notified via secure chat.

## 2023-02-21 NOTE — ED Notes (Signed)
Pt's husband is leaving the hospital, and requesting that the social worker give him a call. Social worker Kelby Fam updated via secure chat.

## 2023-02-21 NOTE — ED Notes (Signed)
Pt given lunch tray. Pt currently sleeping. ED Tech Akia has pt tray, until she wakes up.

## 2023-02-21 NOTE — ED Notes (Signed)
VOL/TOC placement

## 2023-02-21 NOTE — ED Notes (Signed)
Pt given applesauce as a snack

## 2023-02-21 NOTE — ED Notes (Signed)
Pt ate everything on her lunch tray

## 2023-02-21 NOTE — ED Notes (Addendum)
Patient kicked EDT in the stomach. Patient is currently yelling and cursing at this time. RN was notified time the incident happened.

## 2023-02-21 NOTE — ED Notes (Signed)
Pt brief and changing pad soiled with urine. Pt's brief and changing pad replaced by this RN and ed tech Akia. Pt in bed resting with new warm blankets. Pt calm and cooperative during change.

## 2023-02-21 NOTE — ED Notes (Signed)
Patient husband is at bedside visiting. Patient husband is constantly feeding her at bedside at the moment. Patient meal tray has been given at this time.

## 2023-02-21 NOTE — ED Provider Notes (Signed)
-----------------------------------------   4:56 AM on 02/21/2023 -----------------------------------------   Blood pressure (!) 138/99, pulse 89, temperature 98.4 F (36.9 C), temperature source Oral, resp. rate 18, SpO2 98 %.  The patient is calm and cooperative at this time.  There have been no acute events since the last update.  Awaiting disposition plan from case management/social work.    TRUE Garciamartinez, Delice Bison, DO 02/21/23 [phone]

## 2023-02-22 DIAGNOSIS — E041 Nontoxic single thyroid nodule: Secondary | ICD-10-CM | POA: Diagnosis not present

## 2023-02-22 DIAGNOSIS — N390 Urinary tract infection, site not specified: Secondary | ICD-10-CM | POA: Diagnosis not present

## 2023-02-22 NOTE — ED Notes (Signed)
VOL  TOC  PLACEMENT

## 2023-02-22 NOTE — ED Notes (Signed)
Pt given breakfast tray

## 2023-02-22 NOTE — ED Notes (Signed)
Pts gown and brief changed by this tech and Deneise Lever, Therapist, sports. No other needs voiced at this time.

## 2023-02-22 NOTE — ED Notes (Signed)
Pt resting comfortably at this time.

## 2023-02-22 NOTE — ED Notes (Addendum)
This RN and Levonne Spiller EDT spent greater than one hour engaging pt in direct conversation and performing ADLs for pt. Pts hair combed and put in high ponytail. Pts hands soaked in warm water with aromatic cleanser and nail care performed to remove solidified stool/debris from under nail edges.

## 2023-02-22 NOTE — ED Provider Notes (Signed)
-----------------------------------------   6:06 AM on 02/22/2023 -----------------------------------------   Blood pressure 126/68, pulse 89, temperature 98.8 F (37.1 C), temperature source Oral, resp. rate 15, SpO2 97 %.  The patient is calm and cooperative at this time.  There have been no acute events since the last update.  Awaiting disposition plan from Social Work team.   Paulette Blanch, MD 02/22/23 [phone]

## 2023-02-23 DIAGNOSIS — E041 Nontoxic single thyroid nodule: Secondary | ICD-10-CM | POA: Diagnosis not present

## 2023-02-23 DIAGNOSIS — N390 Urinary tract infection, site not specified: Secondary | ICD-10-CM | POA: Diagnosis not present

## 2023-02-23 DIAGNOSIS — F03918 Unspecified dementia, unspecified severity, with other behavioral disturbance: Secondary | ICD-10-CM | POA: Diagnosis not present

## 2023-02-23 MED ORDER — OLANZAPINE 5 MG PO TBDP
2.5000 mg | ORAL_TABLET | Freq: Two times a day (BID) | ORAL | Status: DC | PRN
  Administered 2023-02-26 – 2023-05-26 (×24): 2.5 mg via ORAL
  Filled 2023-02-23 (×25): qty 1

## 2023-02-23 NOTE — Consult Note (Signed)
[facility] Psych ED Progress Note  [id number] PM Janet Murphy  MRN:  [id number]   Method of visit?: Face to Face  REASSESSMENT   Reassessment done at the request of [facility], as apparently they wanted another psych consult to consider for psych admission.   Subjective:  Patient was awake on approach, laying in bed.  She states "I feel good." Irrelevant speech. Has sitter at bedside. Sitter states patient has not been aggressive or agitated. Patient has been generally cooperative since for the past two weeks with some periodic agitation and yelling.  Ativan was given with good result. She is incontinent of bowel and bladder.  Per report and chart review, patient will sometimes have short conversation with requests or responses.   Patient does not meet criteria for inpatient psychiatric admission. She has dementia with cognitive decline that psychiatric admission is not likely to be of benefit.  Patient is active with TOC team for appropriate placement. This Probation officer spoke with [place], [phone]) on 02/08/23, and Ms. Lissa Hoard reported that [facility] has obtained guardianship of patient and "are looking for out of home placement."       Principal Problem: Dementia with behavioral disturbance ([place]) Diagnosis:  Principal Problem:   Dementia with behavioral disturbance ([place])  Total Time spent with patient: 30 minutes  Past Psychiatric History: see previous  Past Medical History:  Past Medical History:  Diagnosis Date   Back pain    Shoulder pain     Past Surgical History:  Procedure Laterality Date   ABDOMINAL HYSTERECTOMY     partial    Family History:  Family History  Problem Relation Age of Onset   Arthritis Mother    Stroke Mother    Heart attack Father    Early death Son 60       car accident    Family Psychiatric  History:  Social History:  Social History   Substance and Sexual Activity  Alcohol Use No     Social History    Substance and Sexual Activity  Drug Use No    Social History   Socioeconomic History   Marital status: Married    Spouse name: Not on file   Number of children: Not on file   Years of education: Not on file   Highest education level: Not on file  Occupational History   Not on file  Tobacco Use   Smoking status: Every Day    Packs/day: 0.50    Types: Cigarettes   Smokeless tobacco: Never  Vaping Use   Vaping Use: Never used  Substance and Sexual Activity   Alcohol use: No   Drug use: No   Sexual activity: Yes  Other Topics Concern   Not on file  Social History Narrative   Not on file   Social Determinants of Health   Financial Resource Strain: Not on file  Food Insecurity: Not on file  Transportation Needs: Not on file  Physical Activity: Not on file  Stress: Not on file  Social Connections: Not on file    Sleep: Good  Appetite:  Good  Current Medications: Current Facility-Administered Medications  Medication Dose Route Frequency Provider Last Rate Last Admin   LORazepam (ATIVAN) tablet 1 mg  1 mg Oral Q6H PRN Arta Silence, MD   1 mg at 02/23/23 0134   melatonin tablet 2.5 mg  2.5 mg Oral QHS Paulette Blanch, MD   2.5 mg at 02/22/23 2202   OLANZapine (  ZYPREXA) tablet 2.5 mg  2.5 mg Oral BID Waldon Merl F, NP   2.5 mg at 02/23/23 0917   OLANZapine zydis (ZYPREXA) disintegrating tablet 2.5 mg  2.5 mg Oral BID PRN Sherlon Handing, NP       Current Outpatient Medications  Medication Sig Dispense Refill   OLANZapine (ZYPREXA) 2.5 MG tablet Take 2.5 mg by mouth 2 (two) times daily.      Lab Results: No results found for this or any previous visit (from the past 48 hour(s)).  Blood Alcohol level:  Lab Results  Component Value Date   ETH <10 02/08/2023   ETH <10 12/31/2022    Physical Findings: AIMS:  , ,  ,  ,    CIWA:    COWS:     Musculoskeletal: Strength & Muscle Tone: within normal limits Gait & Station:  did not observe Patient  leans: N/A  Psychiatric Specialty Exam:  Presentation  General Appearance:  Casual  Eye Contact: Good  Speech: Clear and Coherent  Speech Volume: Normal  Handedness:No data recorded  Mood and Affect  Mood: Euthymic  Affect: Appropriate   Thought Process  Thought Processes: Irrevelant  Descriptions of Associations:Loose  Orientation:Partial  Thought Content:Scattered  History of Schizophrenia/Schizoaffective disorder:No  Duration of Psychotic Symptoms:No data recorded Hallucinations:Hallucinations: None (does not appear to be rtis)  Ideas of Reference:None (noe evident)  Suicidal Thoughts:Suicidal Thoughts: -- (DOES NOT EXPRESS)  Homicidal Thoughts:Homicidal Thoughts: -- (DOES NOT EXPRESS)   Sensorium  Memory: Immediate Poor  Judgment: Impaired  Insight: Lacking   Executive Functions  Concentration: Poor  Attention Span: Poor  Recall: Poor  Fund of Knowledge: Poor  Language: Poor   Psychomotor Activity  Psychomotor Activity:No data recorded  Assets  Assets: Resilience   Sleep  Sleep:No data recorded   Physical Exam: Physical Exam ROS Blood pressure (!) 123/92, pulse 74, temperature 97.8 F (36.6 C), temperature source Oral, resp. rate 16, SpO2 100 %. There is no height or weight on file to calculate BMI.  Treatment Plan Summary: Patient does not meet criteria for inpatient psychiatric hospitalization. She is awaiting appropriate safe placement for her conditions of neurocognitive decline, dementia. Patient has been in the ED since 02/08/23. She is less confused and allowing staff to care for her with few incidents of agitation. Added Olanzapine 2.5 mg twice daily as needed for agitation. Reviewed with EDP and  Evette Cristal, LCSW with TOC team.    Sherlon Handing, NP 02/23/2023, 12:30 PM

## 2023-02-23 NOTE — ED Notes (Signed)
This pts brief and sheet changed by this NT and Leonette Most, RN.

## 2023-02-23 NOTE — ED Notes (Signed)
VOL/Pending TOC Placement

## 2023-02-23 NOTE — ED Notes (Signed)
Pt getting more and more agitated, throwing pillow and blanket. PRN ativan given, see MAR

## 2023-02-23 NOTE — ED Notes (Signed)
Pt had smear of soft stool on sacrum and urinated on bed pads. This RN and NT sitter worked together to provide pt peri care, change briefs and bed pad. Attempted to change linen on bed but pt became hyper-focused on it and wouldn't let staff remove and exchange it.

## 2023-02-23 NOTE — ED Notes (Signed)
VOL TOC placement

## 2023-02-23 NOTE — ED Notes (Signed)
This pts brief was changed by this NT and Leonette Most, Therapist, sports.

## 2023-02-23 NOTE — ED Notes (Signed)
Hospital meal provided.  100% consumed, pt tolerated w/o complaints.  Waste discarded appropriately.   

## 2023-02-23 NOTE — ED Provider Notes (Signed)
-----------------------------------------   6:32 AM on 02/23/2023 -----------------------------------------   Blood pressure 135/78, pulse 80, temperature 97.6 F (36.4 C), temperature source Oral, resp. rate 18, SpO2 100 %.  The patient is calm and cooperative at this time.  There have been no acute events since the last update.  Awaiting disposition plan from Social Work team.   Paulette Blanch, MD 02/23/23 [phone]

## 2023-02-23 NOTE — ED Notes (Signed)
Pt currently restless but easily redirectable.

## 2023-02-23 NOTE — TOC Progression Note (Signed)
Transition of Care [facility]) - Progression Note    Patient Details  Name: Janet Murphy MRN: [id number] Date of Birth: 1956-02-05  [facility] of [facility]) CM/SW Contact  Ross Ludwig, [place] Phone Number: 02/23/2023, 9:49 AM  Clinical Narrative:     CSW received an email from [facility].  [facility] evaluated patient and they do not feel patient's behaviors are well controlled at this time.  They are requesting psych to see patient again.  [employer] requests that patient be evaluated for inpatient psych placement.  Per [facility] if med adjustments are completed they can reconsider patient.  TOC to continue to follow patient's progress throughout discharge planning.     Expected Discharge Plan: Memory Care Barriers to Discharge: Continued Medical Work up  Expected Discharge Plan and Services   Discharge Planning Services: CM Consult   Living arrangements for the past 2 months: Single Family Home                 DME Arranged: N/A DME Agency: NA                   Social Determinants of Health (SDOH) Interventions SDOH Screenings   Tobacco Use: High Risk (02/08/2023)    Readmission Risk Interventions     No data to display

## 2023-02-24 DIAGNOSIS — E041 Nontoxic single thyroid nodule: Secondary | ICD-10-CM | POA: Diagnosis not present

## 2023-02-24 DIAGNOSIS — N390 Urinary tract infection, site not specified: Secondary | ICD-10-CM | POA: Diagnosis not present

## 2023-02-24 NOTE — ED Notes (Signed)
Patient given two applesauces.

## 2023-02-24 NOTE — ED Notes (Signed)
Hospital meal provided.  100% consumed, pt tolerated w/o complaints.  Waste discarded appropriately.   

## 2023-02-24 NOTE — ED Notes (Signed)
Pericare provided, linen change and brief change. Pt incontinent of stool and smearing all over bed and self. Bed bath given with no rinse spray by EDT

## 2023-02-24 NOTE — ED Notes (Signed)
Brief changed, pericare provided and linen change by sitter and ED Tech. Pt clean and dry at this time.

## 2023-02-24 NOTE — ED Notes (Signed)
Patient took night medications with no issues.

## 2023-02-24 NOTE — ED Notes (Signed)
Pt  given applesauce and graham crackers and water.

## 2023-02-24 NOTE — ED Notes (Signed)
Incontinent of BM. Pericare provided. Pt  clean and dry

## 2023-02-24 NOTE — ED Notes (Signed)
Patient was walked to the bathroom. Patient used the bathroom. Patient was changed into some clean clothes and a clean brief. Patient linens were also changed. Patient is resting comfortably in her bed.

## 2023-02-24 NOTE — ED Provider Notes (Signed)
-----------------------------------------   3:48 AM on 02/24/2023 -----------------------------------------   Blood pressure (!) 123/92, pulse 74, temperature 97.8 F (36.6 C), temperature source Oral, resp. rate 16, SpO2 100 %.  The patient is calm and cooperative at this time.  There have been no acute events since the last update.  Awaiting disposition plan from case management/social work.    Darshay Deupree, Delice Bison, DO 02/24/23 [phone]

## 2023-02-24 NOTE — TOC Progression Note (Signed)
Transition of Care [facility]) - Progression Note    Patient Details  Name: Janet Murphy MRN: [id number] Date of Birth: 11/26/1956  [facility]) CM/SW Contact  Ross Ludwig, [place] Phone Number: 02/21/2023 2:29pm  Clinical Narrative:     Late entry, no further updates, [facility] still considering accepting patient.   Expected Discharge Plan: Memory Care Barriers to Discharge: Continued Medical Work up  Expected Discharge Plan and Services   Discharge Planning Services: CM Consult   Living arrangements for the past 2 months: Single Family Home                 DME Arranged: N/A DME Agency: NA                   Social Determinants of Health (SDOH) Interventions SDOH Screenings   Tobacco Use: High Risk (02/08/2023)    Readmission Risk Interventions     No data to display

## 2023-02-24 NOTE — TOC Progression Note (Addendum)
Transition of Care [facility]) - Progression Note    Patient Details  Name: Janet Murphy MRN: [id number] Date of Birth: 03-13-1956  Transition of [facility]) CM/SW Contact  Ross Ludwig, [place] Phone Number: 02/24/2023, 2:36 PM  Clinical Narrative:     CSW contacted [facility] to discuss patient awaiting a call back.  Per patient's husband he states behaviors have improved, and may consider taking her home with him.  CSW contacted DSS to see what they think since they are the legal guardian for patient.  TOC to continue to follow patient's progress throughout discharge planning.  3:06pm CSW received an update from Toula Moos at [facility].  Per Barrie Lyme, patient can not return back home with her husband.  Per Barrie Lyme, she will cotact [facility] to reevaluate patient with medication changes to see if they can accept her.  DSS to update [employer].  TOC to continue to follow patient's progress throughout discharge planning.  Expected Discharge Plan: Memory Care Barriers to Discharge: Continued Medical Work up  Expected Discharge Plan and Services   Discharge Planning Services: CM Consult   Living arrangements for the past 2 months: Single Family Home                 DME Arranged: N/A DME Agency: NA                   Social Determinants of Health (SDOH) Interventions SDOH Screenings   Tobacco Use: High Risk (02/08/2023)    Readmission Risk Interventions     No data to display

## 2023-02-24 NOTE — TOC Progression Note (Signed)
Transition of Care [facility]) - Progression Note    Patient Details  Name: Janet Murphy MRN: [id number] Date of Birth: 11-09-1956  Transition of Care [facility]) CM/SW Contact  Ross Ludwig, [place] Phone Number: 2/29 12:00pm  Clinical Narrative:     Late entry, per [place] they are the legal guardian and are trying to find placement for patient at [place].  [place] would like patient's behaviors to be managed better with medication.   Expected Discharge Plan: Memory Care Barriers to Discharge: Continued Medical Work up  Expected Discharge Plan and Services   Discharge Planning Services: CM Consult   Living arrangements for the past 2 months: Single Family Home                 DME Arranged: N/A DME Agency: NA                   Social Determinants of Health (SDOH) Interventions SDOH Screenings   Tobacco Use: High Risk (02/08/2023)    Readmission Risk Interventions     No data to display

## 2023-02-25 DIAGNOSIS — N390 Urinary tract infection, site not specified: Secondary | ICD-10-CM | POA: Diagnosis not present

## 2023-02-25 DIAGNOSIS — E041 Nontoxic single thyroid nodule: Secondary | ICD-10-CM | POA: Diagnosis not present

## 2023-02-25 NOTE — ED Notes (Signed)
Hospital meal provided.  100% consumed, pt tolerated w/o complaints.  Waste discarded appropriately.   

## 2023-02-25 NOTE — ED Notes (Signed)
Pt given dinner tray and drink. Pt appreciative.

## 2023-02-25 NOTE — ED Notes (Signed)
Pt's family member allowed to visit briefly. Pt tolerated visit well.

## 2023-02-25 NOTE — ED Notes (Signed)
This NT changed pt into new brief, shirt, and pants.

## 2023-02-25 NOTE — ED Notes (Signed)
Pts linen and depends changed. Pt had a bm. This tech and sitter at beside cleansed pt. New brief, clean chux and clean linen sheet applied. Warm blankets given. No other needs voiced at this time.

## 2023-02-25 NOTE — ED Notes (Signed)
Pt yelling in room and getting up. PRN ativan given, see MAR

## 2023-02-25 NOTE — ED Notes (Signed)
Pt given snack and juice at this time.

## 2023-02-25 NOTE — ED Provider Notes (Signed)
-----------------------------------------   6:48 AM on 02/25/2023 -----------------------------------------   Blood pressure 119/79, pulse 92, temperature 97.7 F (36.5 C), temperature source Oral, resp. rate 18, SpO2 98 %.  The patient is calm and cooperative at this time.  There have been no acute events since the last update.  Awaiting disposition plan from [facility] team.   Hinda Kehr, MD 02/25/23 [phone]

## 2023-02-25 NOTE — ED Notes (Signed)
Vol / pending TOC placement 

## 2023-02-26 NOTE — ED Notes (Signed)
Bed linen and brief changed by sitter, NT Latrice

## 2023-02-26 NOTE — ED Notes (Signed)
Patient is vol pending placement 

## 2023-02-26 NOTE — ED Notes (Signed)
Vol/Pending TOC placement

## 2023-02-26 NOTE — ED Notes (Signed)
Pt lying in bed, sleeping, sitter at bedside

## 2023-02-26 NOTE — ED Notes (Signed)
LUNCH TRAY GIVEN. 

## 2023-02-26 NOTE — ED Notes (Signed)
Pt given night time snack, by sitter NT Latrice

## 2023-02-26 NOTE — ED Notes (Signed)
Inc care and full linen change provided. Dry brief and new pants given.

## 2023-02-27 DIAGNOSIS — N390 Urinary tract infection, site not specified: Secondary | ICD-10-CM | POA: Diagnosis not present

## 2023-02-27 DIAGNOSIS — E041 Nontoxic single thyroid nodule: Secondary | ICD-10-CM | POA: Diagnosis not present

## 2023-02-27 NOTE — ED Notes (Signed)
Pt has small spot on left lower lip that she has been picking at and made bleed, bleeding very minimal and controlled at this time, however pt continues to pick at area. Sitter aware and at bedside. Pt eating snack at this time

## 2023-02-27 NOTE — ED Notes (Signed)
Unable to get a temp at this time.

## 2023-02-27 NOTE — ED Provider Notes (Signed)
Emergency Medicine Observation Re-evaluation Note  Janet Murphy is a 67 y.o. female, seen on rounds today.  Pt initially presented to the ED for complaints of IVC  Currently, the patient is resting  Physical Exam  Blood pressure (!) 134/93, pulse 68, temperature 97.8 F (36.6 C), temperature source Oral, resp. rate 18, SpO2 98 %.  Plan   Current plan is to continue to wait for [facility] placement    Vanessa [place], MD 02/27/23 [phone]

## 2023-02-27 NOTE — ED Notes (Signed)
Meds crushed and given in applesauce  

## 2023-02-27 NOTE — ED Notes (Signed)
This tech and RN bill helped NT/sitter Loma Sousa change pt and put on new brief

## 2023-02-27 NOTE — ED Notes (Signed)
Pt with increasing anxiety, agitation. Medication given. See [facility]

## 2023-02-27 NOTE — ED Notes (Signed)
Pt given dinner tray and drink. Sitter is at bedside

## 2023-02-27 NOTE — ED Notes (Signed)
Per numerous social work/ case management notes, patients legal guardian is [place] [facility]. Patients FYI has been updated to reflect this.

## 2023-02-27 NOTE — ED Notes (Signed)
Visitor at bedside.

## 2023-02-28 NOTE — NC FL2 (Signed)
  [place] LEVEL OF CARE FORM     IDENTIFICATION  Patient Name: Janet Murphy Birthdate: Aug 21, 1956 Sex: female Admission Date (Current Location): 02/08/2023  [facility] and [place] Number:  Engineering geologist and Address:  [facility], [address], [place], [place] [address]      Provider Number: [id number]  Attending Physician Name and Address:  No att. providers found  Relative Name and Phone Number:  [facility] Spouse   [phone]  [place]  [phone]    Current Level of Care: Hospital Recommended Level of Care: [place], Memory Care Prior Approval Number:    Date Approved/Denied:   PASRR Number:    Discharge Plan: Domiciliary (Rest home) (Memory Care ALF)    Current Diagnoses: Patient Active Problem List   Diagnosis Date Noted   Dementia with behavioral disturbance ([place]) 12/31/2022   Vitamin D deficiency 06/16/2017   B12 deficiency 06/16/2017   Underweight 06/15/2017   DDD (degenerative disc disease), thoracic 03/22/2014    Orientation RESPIRATION BLADDER Height & Weight     Self  Normal Incontinent Weight:   Height:     BEHAVIORAL SYMPTOMS/MOOD NEUROLOGICAL BOWEL NUTRITION STATUS      Incontinent Diet  AMBULATORY STATUS COMMUNICATION OF NEEDS Skin   Supervision Verbally Normal                       Personal Care Assistance Level of Assistance  Bathing, Feeding, Dressing Bathing Assistance: Limited assistance Feeding assistance: Independent Dressing Assistance: Limited assistance     Functional Limitations Info  Sight, Hearing, Speech Sight Info: Adequate Hearing Info: Adequate Speech Info: Adequate    SPECIAL CARE FACTORS FREQUENCY                       Contractures Contractures Info: Not present    Additional Factors Info  Code Status, Allergies, Psychotropic Code Status Info: Full Code Allergies Info: NKA Psychotropic Info: OLANZapine  (ZYPREXA) tablet 2.5 mg         Current Medications (02/28/2023):  This is the current hospital active medication list Current Facility-Administered Medications  Medication Dose Route Frequency Provider Last Rate Last Admin   LORazepam (ATIVAN) tablet 1 mg  1 mg Oral Q6H PRN Arta Silence, MD   1 mg at 02/28/23 1012   melatonin tablet 2.5 mg  2.5 mg Oral QHS Paulette Blanch, MD   2.5 mg at 02/27/23 2101   OLANZapine (ZYPREXA) tablet 2.5 mg  2.5 mg Oral BID Waldon Merl F, NP   2.5 mg at 02/28/23 1011   OLANZapine zydis (ZYPREXA) disintegrating tablet 2.5 mg  2.5 mg Oral BID PRN Sherlon Handing, NP   2.5 mg at 02/28/23 4970   Current Outpatient Medications  Medication Sig Dispense Refill   OLANZapine (ZYPREXA) 2.5 MG tablet Take 2.5 mg by mouth 2 (two) times daily.       Discharge Medications: Please see discharge summary for a list of discharge medications.  Relevant Imaging Results:  Relevant Lab Results:   Additional Information SSN [id number]  Ross Ludwig, LCSW

## 2023-02-28 NOTE — ED Notes (Signed)
VOL  TOC  PLACEMENT 

## 2023-02-28 NOTE — ED Notes (Signed)
Pt incontinent of urine at this time. Clean brief placed on pt.

## 2023-02-28 NOTE — TOC Progression Note (Signed)
Transition of Care [facility]) - Progression Note    Patient Details  Name: Janet Murphy MRN: [id number] Date of Birth: 1956/05/21  [facility]) CM/SW Contact  Ross Ludwig, [place] Phone Number: 02/28/2023, 4:34 PM  Clinical Narrative:     Meeting with DSS today, patient can not go home with her husband due to DSS being the legal guardian.  CSW was asked to fax updated FL2, and progress notes to DSS to forward to [facility] to revaluate patient for placement in memory care.  TOC to continue to follow patient's progress throughout discharge planning.  Expected Discharge Plan: Memory Care Barriers to Discharge: Continued Medical Work up  Expected Discharge Plan and Services   Discharge Planning Services: CM Consult   Living arrangements for the past 2 months: Single Family Home                 DME Arranged: N/A DME Agency: NA                   Social Determinants of Health (SDOH) Interventions SDOH Screenings   Tobacco Use: High Risk (02/08/2023)    Readmission Risk Interventions     No data to display

## 2023-02-28 NOTE — ED Notes (Signed)
Lunch given.

## 2023-02-28 NOTE — ED Notes (Signed)
Pt cleaned with peri wipes at this time, clean brief placed on pt, and bed linen changed at this time.

## 2023-02-28 NOTE — ED Notes (Signed)
Pt given a sandwich tray and ice cream

## 2023-02-28 NOTE — ED Notes (Signed)
Husband completed supervised visit.

## 2023-02-28 NOTE — ED Notes (Addendum)
Husband having supervised visit with patient

## 2023-02-28 NOTE — ED Notes (Signed)
Pt pulled down her pants and removed brief at this time and threw it in the floor. Pt refuses to let this tech put brief back on.

## 2023-02-28 NOTE — ED Notes (Signed)
This tech and Andee Poles, NT provided pericare. Linens changed and new hospital provided scrubs.

## 2023-02-28 NOTE — ED Notes (Signed)
New brief placed on pt and new linen on bed new pants on pt, pt cleaned with wipes. Pt returned to bed.

## 2023-03-01 DIAGNOSIS — E041 Nontoxic single thyroid nodule: Secondary | ICD-10-CM | POA: Diagnosis not present

## 2023-03-01 DIAGNOSIS — N390 Urinary tract infection, site not specified: Secondary | ICD-10-CM | POA: Diagnosis not present

## 2023-03-01 NOTE — ED Notes (Signed)
VOL TOC placement 

## 2023-03-01 NOTE — ED Notes (Signed)
Visitor husband now leaving.

## 2023-03-01 NOTE — ED Notes (Addendum)
Pt agitated and not responding to sitter directions.

## 2023-03-01 NOTE — ED Notes (Signed)
Pt received meal tray at this time with beverage.

## 2023-03-01 NOTE — ED Notes (Signed)
Pt urinated on self at this time. This tech and sitter [place], changed pt at this time. Linens were changed, pt clothes were changed and new brief applied. Pt tolerated well.

## 2023-03-01 NOTE — ED Provider Notes (Signed)
    02/28/2023    8:38 PM 02/28/2023    8:23 AM 02/27/2023   10:09 PM  Vitals with BMI  Systolic [phone]  Diastolic 64 82 72  Pulse 88 94 87     Patient is sleeping comfortably on my evaluation.  There has been no change in the last 24 hours.  She is awaiting social work placement.   Rada Hay, MD 03/01/23 [phone]

## 2023-03-01 NOTE — ED Notes (Signed)
vol/toc placement.. 

## 2023-03-01 NOTE — ED Notes (Signed)
Husband with pt for a visit.  Pt calm and cooperative.    Sitter with pt.

## 2023-03-01 NOTE — ED Notes (Signed)
Sitter with pt.  Pt alert, resting in bed.

## 2023-03-01 NOTE — ED Notes (Signed)
Pt is agitated and is not able to be directed. Pt got up and pooped on the floor.

## 2023-03-02 DIAGNOSIS — N390 Urinary tract infection, site not specified: Secondary | ICD-10-CM | POA: Diagnosis not present

## 2023-03-02 DIAGNOSIS — E041 Nontoxic single thyroid nodule: Secondary | ICD-10-CM | POA: Diagnosis not present

## 2023-03-02 NOTE — ED Notes (Signed)
Assisted pt to restroom with Verdis Frederickson (NT). Pt cleaned up and provided clean pants and brief. Pts linen changed as well.

## 2023-03-02 NOTE — ED Notes (Signed)
Vol / pending TOC placement 

## 2023-03-02 NOTE — ED Notes (Signed)
Attempted to get vs, pt took off BP cuff and refused vitals.

## 2023-03-02 NOTE — ED Notes (Signed)
Pt up walking around room, sitter at side, pt not yelling, is redirectable by sitter.

## 2023-03-02 NOTE — ED Notes (Signed)
Snack provided for pt, bed linens changed as pt has urinary incontinence.  Sitter with pt

## 2023-03-02 NOTE — ED Notes (Signed)
Pt pacing around room, yelling "I hate you bitch".

## 2023-03-02 NOTE — ED Notes (Signed)
Pt sleeping, no further yelling or pacing noted since ativan administration.

## 2023-03-02 NOTE — ED Notes (Signed)
Lunch tray provided. Pt ate 75% of tray. Waste discarded

## 2023-03-02 NOTE — ED Notes (Signed)
Dinner tray provided for pt. Pt consumed about 50% of food.

## 2023-03-02 NOTE — ED Notes (Signed)
Pt provided with graham crackers and water at this time.

## 2023-03-02 NOTE — ED Provider Notes (Signed)
Emergency Medicine Observation Re-evaluation Note  Physical Exam   BP 128/84 (BP Location: Left Arm)   Pulse 78   Temp 98.7 F (37.1 C) (Oral)   Resp 20   SpO2 99%   Patient appears in no acute distress.  ED Course / MDM   No reported events during my shift at the time of this note.   Pt is awaiting dispo from SW   Lucillie Garfinkel MD    Lucillie Garfinkel, MD 03/02/23 [phone]

## 2023-03-03 DIAGNOSIS — E041 Nontoxic single thyroid nodule: Secondary | ICD-10-CM | POA: Diagnosis not present

## 2023-03-03 DIAGNOSIS — N390 Urinary tract infection, site not specified: Secondary | ICD-10-CM | POA: Diagnosis not present

## 2023-03-03 NOTE — ED Notes (Signed)
Pt jumping out of bed and not easily redirected. Pt ate majority of her breakfast tray with assistance. Pt now resting calmly. This tech remains sitting at bedside.

## 2023-03-03 NOTE — ED Notes (Signed)
Dinner tray delivered. Pt ate 50%

## 2023-03-03 NOTE — ED Notes (Signed)
Attempted to obtain vital signs. Pt refused to allow this writer to place pulse ox on finger. Pt did allow the BP cuff to be placed on arm but became agitated and removed BP cuff.

## 2023-03-03 NOTE — ED Notes (Signed)
Pt had soaked bed. Pt was cleaned and given clean scrubs. All bed linens were changed as well. Pt is back in bed with no other needs at this time.

## 2023-03-03 NOTE — ED Notes (Signed)
Patient demonstrating increasing agitation. Frequently attempting to get out of bed, yelling, "Oh God! Oh God! Oh Jesus!" Patient pulling pants off. Patient not redirectable and verbal de escalation ineffective. Ativan given and sitter at bedside. Patient currently eating applesauce. Resp even, unlabored on RA.

## 2023-03-03 NOTE — ED Notes (Addendum)
Pt urinated on self. Pt cleaned and clean pants provided to pt and bed linens changed. Warm blankets also provided to pt.

## 2023-03-03 NOTE — ED Notes (Signed)
Pt provided with crackers and water at this time.

## 2023-03-03 NOTE — ED Notes (Signed)
vol/pending toc placment. 

## 2023-03-03 NOTE — ED Provider Notes (Signed)
    03/02/2023    5:57 AM 03/01/2023    8:20 PM 02/28/2023    8:38 PM  Vitals with BMI  Systolic [phone]  Diastolic 69 84 64  Pulse 70 78 88    Patient sleeping comfortably for the entirety of my shift.  No acute events reported to me by nursing staff.  Patient is pending social work disposition.   Rada Hay, MD 03/03/23 [phone]

## 2023-03-03 NOTE — ED Notes (Signed)
IVC/TOC Placement

## 2023-03-03 NOTE — ED Notes (Signed)
VOL/TOC Placement 

## 2023-03-03 NOTE — ED Notes (Signed)
Sister visited and given update on pt status

## 2023-03-03 NOTE — ED Notes (Signed)
Lunch tray provided for pt. Pt consumed about 25% of the food at this time.

## 2023-03-03 NOTE — ED Notes (Signed)
Attempted to obtain vital signs. Patient becoming increasingly agitated and would not tolerate blood pressure or temperature to be taken.

## 2023-03-03 NOTE — ED Notes (Addendum)
Attempted to get vital signs. Pt repeated "this hurts" and tried to bite writer, pt took BP cuff off arm. Attempted to place pulse ox on pt's finger, pt refused.

## 2023-03-04 DIAGNOSIS — E041 Nontoxic single thyroid nodule: Secondary | ICD-10-CM | POA: Diagnosis not present

## 2023-03-04 DIAGNOSIS — N390 Urinary tract infection, site not specified: Secondary | ICD-10-CM | POA: Diagnosis not present

## 2023-03-04 NOTE — ED Notes (Signed)
Patient received lunch tray at this time. Patient ate 25% of her food.

## 2023-03-04 NOTE — ED Provider Notes (Signed)
-----------------------------------------   5:44 AM on 03/04/2023 -----------------------------------------   Blood pressure 118/69, pulse 70, temperature 98.7 F (37.1 C), temperature source Oral, resp. rate 16, SpO2 97 %.  The patient is calm and cooperative at this time.  There have been no acute events since the last update.  Awaiting disposition plan from Social Work team.   Paulette Blanch, MD 03/04/23 [phone]

## 2023-03-04 NOTE — ED Notes (Signed)
Pt urinated on self. Pt cleaned and a clean brief as well as a clean scrub top and bottoms was placed on pt. Bed linen changed and pt provided with warm blankets.

## 2023-03-04 NOTE — ED Notes (Addendum)
Pt removed chucks pad from bed, pt also attempting to remove pants. This Probation officer offered to walk pt to restroom, pt declined. Pt pulled pants back up and covered up with blankets.

## 2023-03-04 NOTE — ED Notes (Signed)
Pt urinated in brief. Pt cleaned and a clean brief placed on pt. 

## 2023-03-04 NOTE — ED Notes (Signed)
VOL TOC placement 

## 2023-03-04 NOTE — ED Notes (Signed)
Pt urinated in brief. Pt cleaned and a clean brief and scrub pants placed on pt. Bed linens were changed and warm blankets provided to pt.

## 2023-03-04 NOTE — ED Notes (Signed)
Assumed care of pt. Pt is agitated and walked to bathroom. Pt cleaned and returned to bed. Pt resting and sitter at bedside.

## 2023-03-04 NOTE — ED Notes (Signed)
Pt provided with graham crackers and a cup of water at this time.

## 2023-03-04 NOTE — ED Notes (Signed)
Patient received her meal tray at 4:30pm, and ate her entire tray. Along with two snacks

## 2023-03-04 NOTE — ED Notes (Signed)
VOL  TOC  PLACEMENT 

## 2023-03-04 NOTE — ED Notes (Signed)
Pt is awake, pt is lying on bed, no needs at present

## 2023-03-04 NOTE — ED Notes (Signed)
Patient husband is currently at bedside.

## 2023-03-04 NOTE — ED Notes (Signed)
PATIENT KEEPS TAKING OFF HER BRIEF. PATIENT HAS BEEN CHANGED MULTIPLE TIMES. PATIENT STILLS CONTINUES TO KEEP TAKING OFF BRIEF EVEN WHEN SHE IS NOT WET.

## 2023-03-04 NOTE — ED Notes (Signed)
Pt given applesauce and Ativan by Lynita Lombard.

## 2023-03-04 NOTE — TOC Progression Note (Addendum)
Transition of Care [facility]) - Progression Note    Patient Details  Name: Janet Murphy MRN: [id number] Date of Birth: 1956/11/03  Transition of Care [facility]) CM/SW Contact  Ross Ludwig, [place] Phone Number: 03/03/2023  Clinical Narrative:    Patient discussed during DTP meeting,DSS contacting [place] again to find out when they are going to review patient again.   Expected Discharge Plan: Memory Care Barriers to Discharge: Continued Medical Work up  Expected Discharge Plan and Services   Discharge Planning Services: CM Consult   Living arrangements for the past 2 months: Single Family Home                 DME Arranged: N/A DME Agency: NA                   Social Determinants of Health (SDOH) Interventions SDOH Screenings   Tobacco Use: High Risk (02/08/2023)    Readmission Risk Interventions     No data to display

## 2023-03-04 NOTE — ED Notes (Addendum)
Assumed 1:1 care of pt.  Pt  very restless.  Taking pants off and on multiple times.  Attempting to remove brief but was redirected.  Offered water.  States "Fuck that water " and pushed my hand away.  Continuing to try and get pt to settle and try and sleep.

## 2023-03-04 NOTE — ED Notes (Signed)
Pt able to settle down and attempt to sleep.  Pt places blanket over eyes to keep bright lights out.  Observing rise and fall of chest while pt is sleeping/

## 2023-03-04 NOTE — ED Notes (Addendum)
Pt tearing off shirt but ripping it and continues to remove pants.  Screaming at me stating "get away from me bitch.  You bitch,".  Attempts to get pt to settle down were unsuccessful.  Changed brief.

## 2023-03-05 DIAGNOSIS — N390 Urinary tract infection, site not specified: Secondary | ICD-10-CM | POA: Diagnosis not present

## 2023-03-05 DIAGNOSIS — E041 Nontoxic single thyroid nodule: Secondary | ICD-10-CM | POA: Diagnosis not present

## 2023-03-05 NOTE — ED Notes (Signed)
Pt took off brief again, urinated on chucks pad and stated "I just peed". Pt brief and pad changed.

## 2023-03-05 NOTE — ED Notes (Signed)
Patient is resting comfortably. 

## 2023-03-05 NOTE — ED Notes (Signed)
Family member left. Safety sitter at bedside. Pt voiced no needs at this time.

## 2023-03-05 NOTE — ED Notes (Signed)
Pt brief changed.  

## 2023-03-05 NOTE — ED Provider Notes (Signed)
-----------------------------------------   6:23 AM on 03/05/2023 -----------------------------------------   Blood pressure 118/69, pulse 70, temperature 98.7 F (37.1 C), temperature source Oral, resp. rate 16, SpO2 97 %.  The patient is calm and cooperative at this time.  There have been no acute events since the last update.  Awaiting disposition plan from [facility] team.   Hinda Kehr, MD 03/05/23 [phone]

## 2023-03-05 NOTE — ED Notes (Signed)
Hospital dinner tray, consisting of spaghetti, fed to pt by hand to avoid making a mess on the clean linens and to prevent any choking hazards. Pt fed small bites with sips of sweet tea in between bites. Meal fully consumed and trash disposed of properly.   Pts family member at bedside visiting with pt. EDT safety sitter remains at bedside.

## 2023-03-05 NOTE — ED Notes (Signed)
Pt given water 

## 2023-03-05 NOTE — ED Notes (Signed)
Pt speaking in full sentences to family member stating "I have been here too long...can you talk to the doctor and tell him I can come home..I want to go home...can you pick me up on Monday?" Pt smiling and drinking a Pespi provided by family member. Pt calm and cooperative with family member.

## 2023-03-05 NOTE — ED Notes (Signed)
Brief changed due to pt having a BM. Pt ripped brief off. Peri care performed, new brief and new chux placed, and scrub pants put back on by pt.  Pt continues to throw items such as brief, chux, and activity mat at EDT. Pt made aware this behavior will not be tolerated. Pt undressing stating "it's hot". Safety sitter at bedside.

## 2023-03-05 NOTE — ED Notes (Signed)
Patient is vol pending TOC placement 

## 2023-03-05 NOTE — ED Notes (Signed)
This EDT assumed the safety sitting role for this pt. Upon a quick glance, EDT could see the pt had removed her brief, chux, and half the bed sheet was off the bed. Pts scrub top was torn down the middle and pt is using it as a robe, folding it across her body. Hands were dirty with a dark brown substance, pt was restless and trying to get out of the bed. Blankets and brief were on the floor due to the pt throwing them out of her bed.  EDT changed pts soiled brief and entire bed sheets do to the pt voiding and having a large BM. Pt seen with hardened, dark brown stool underneath her fingernails. Fingernails were cleaned to the best of this techs current abilities. Peri care performed by this EDT. Clean brief, hospital scrub pants, multiple chux, bed sheet, blankets, and pillowcase provided. Pt repeating "Get that, bitch...thank you bitch...go to hell...fuck you bitch..."

## 2023-03-05 NOTE — ED Notes (Signed)
Pt keeps taking off brief when replaced. Brief changed again.

## 2023-03-06 DIAGNOSIS — N390 Urinary tract infection, site not specified: Secondary | ICD-10-CM | POA: Diagnosis not present

## 2023-03-06 DIAGNOSIS — E041 Nontoxic single thyroid nodule: Secondary | ICD-10-CM | POA: Diagnosis not present

## 2023-03-06 NOTE — ED Notes (Signed)
Pt urinated in brief. Pt cleaned and a clean brief and scrub pants placed on pt. Bed linen changed and pt provided with warm blankets.

## 2023-03-06 NOTE — ED Notes (Signed)
Pt screams out "TAKE ME TO HEAVEN" this tech asks pt tp stop screaming, as I have multiple times, pt looks at this tech and states "shut up asshole"  Pt also takes all blankets off of bed and throws them off the bed at this tech onto the floor.

## 2023-03-06 NOTE — ED Notes (Signed)
Call pharmacy to verify that it was alright to give pt scheduled medication since PRN was given and stated it was alright to give scheduled dosage of medication.

## 2023-03-06 NOTE — ED Notes (Signed)
Pt urinated in brief. Pt cleaned and a clean brief placed on pt. 

## 2023-03-06 NOTE — ED Notes (Signed)
Pt awake and asking for food. Breakfast tray is here and is placed near secretary desk however this tech cannot leave pt bedside as this tech is a 1:1 for this pt and another pt as well. Pt keeps attempting to get out bed. Pt rips off her pants and brief, pt is not listening to verbal redirection by this tech.

## 2023-03-06 NOTE — ED Notes (Signed)
Pt is stating "I'm dead". Pt is pointing to her breakfast tray that still has some food left on it. This tech hands pt the food. Pt the eats some of the food then throws the plate with food on it to her side, food then fly's onto the ground.

## 2023-03-06 NOTE — ED Notes (Signed)
Pt woke up again. Pt takes covers off and covers herself with her scrub top as if it were a robe. Pt then lays back down and covers herself up with her blanket and returns to sleeping.

## 2023-03-06 NOTE — ED Notes (Signed)
Pt is resting in bed with eyes closed. Equal chest rise and fall noted, RR unlabored.

## 2023-03-06 NOTE — ED Notes (Signed)
This tech as 1:1 Air cabin crew for this pt along with another pt.

## 2023-03-06 NOTE — ED Notes (Signed)
Pt continuously asking "give me that cookie". Pt has eaten her breakfast and has been told by this tech that there is no cookie. Pt stating " I know" over and over again.   Pt states "go away ugly lady" and "hate you too". Pt is yelling out loud phrases such as "hey mother" "hey god" 'hate you too". Pt also yells out a loud scream.   Pt keeps asking for food and states "take me to heaven you son of a bitch" pt throws her brief and chux pad that have been soiled with stool at the white divider. And states "fuck you give me some food" Pt is looking up at the ceiling constantly yelling "take me to heaven" over and over again

## 2023-03-06 NOTE — ED Notes (Signed)
Attempted to get pt's vitals. Pt refused, stating "go away".

## 2023-03-06 NOTE — ED Notes (Signed)
Pt was provided with new beh scrub top. Pt given mesh underwear and brief, would only keep the mesh underwear on. Pt provided with new blankets and graham crackers and water. Pt eats the graham crackers, drinks some of the water then throws the water cup (with water in it) down to the bottom of her bed.   Pt continues to scream "take me to heaven" extremely loudly. Pt also stating the following phrases " I'm dead" "hey Jesus I see you" "I see jesus"

## 2023-03-06 NOTE — ED Provider Notes (Signed)
-----------------------------------------   6:02 AM on 03/06/2023 -----------------------------------------   Blood pressure 128/76, pulse 65, temperature 97.8 F (36.6 C), temperature source Oral, resp. rate 20, SpO2 96 %.  The patient is calm and cooperative at this time.  There have been no acute events since the last update.  Awaiting disposition plan from case management/social work.    Samary Shatz, Delice Bison, DO 03/06/23 [phone]

## 2023-03-06 NOTE — ED Notes (Addendum)
Pt fell back asleep for a short period.   Pt is awake now, takes her cup from earlier, rips it up and throws it. Pt attempts to take her pants off, this tech tries to redirect pt, pt states "fuck you bitch" and keeps pants on. Pt again yelling "take me to heaven" very loudly. Pt keeps asking for food and "cookies" Pt keeps attempting to get out of bed

## 2023-03-06 NOTE — ED Notes (Signed)
Pt just awoke very abruptly. Pt stretched in bed. Pt is noted to be wearing a burgundy scrub top that is cut down the middle. Pt then lays back down and covers herself of and goes back to resting.

## 2023-03-06 NOTE — ED Notes (Signed)
Pt extremely agitated and repetitively stating nasty words to NT.

## 2023-03-06 NOTE — ED Notes (Signed)
Pt provided with graham crackers and a cup of water at this time.
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
## 2023-03-06 NOTE — ED Notes (Signed)
VOL TOC placement 
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
## 2023-03-07 DIAGNOSIS — E041 Nontoxic single thyroid nodule: Secondary | ICD-10-CM | POA: Diagnosis not present

## 2023-03-07 DIAGNOSIS — N390 Urinary tract infection, site not specified: Secondary | ICD-10-CM | POA: Diagnosis not present

## 2023-03-07 NOTE — ED Notes (Signed)
Pt attempting to take off brief repeating "peed in the bed". This tech and EDT Sydni changed pt brief.

## 2023-03-07 NOTE — ED Notes (Addendum)
Patient brief and bed linen changed. Pt resting at this time.
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
## 2023-03-07 NOTE — ED Notes (Signed)
Breakfast tray given to pt. Pt consumed about 50% of food at this time.

## 2023-03-07 NOTE — ED Notes (Signed)
VOL TOC placement 

## 2023-03-07 NOTE — ED Notes (Signed)
Pt had soiled brief. Writer cleaned up brief and placed new one on. Pt appeared a little agitated, writer gave pt back rubs and brushed hair. Pt had calmed down.

## 2023-03-07 NOTE — ED Notes (Signed)
Pt again attempting to take off brief repeating "peed in bed' this tech and EDT Sydni changed pt brief.

## 2023-03-07 NOTE — TOC Progression Note (Signed)
Transition of Care [facility]) - Progression Note    Patient Details  Name: Janet Murphy MRN: [id number] Date of Birth: 1956/11/20  Transition of Care [facility]) CM/SW Contact  Ross Ludwig, [place] Phone Number: 03/07/2023, 3:54 PM  Clinical Narrative:     CSW spoke to [place] patient's legal guardian.  Jennifer from [employer] will be assessing patient tomorrow to see if the medication changes have improved patient's behaviors.  TOC to continue to follow patient's progress throughout discharge planning.   Expected Discharge Plan: Memory Care Barriers to Discharge: Continued Medical Work up  Expected Discharge Plan and Services   Discharge Planning Services: CM Consult   Living arrangements for the past 2 months: Single Family Home                 DME Arranged: N/A DME Agency: NA                   Social Determinants of Health (SDOH) Interventions SDOH Screenings   Tobacco Use: High Risk (02/08/2023)    Readmission Risk Interventions     No data to display

## 2023-03-07 NOTE — ED Provider Notes (Signed)
-----------------------------------------   8:25 AM on 03/07/2023 -----------------------------------------   Blood pressure (!) 114/98, pulse 71, temperature 97.8 F (36.6 C), temperature source Oral, resp. rate 16, SpO2 100 %.  The patient is calm and cooperative at this time.  There have been no acute events since the last update.  Awaiting disposition plan from [facility] team.   Hinda Kehr, MD 03/07/23 [phone]

## 2023-03-07 NOTE — ED Notes (Signed)
Pt continues to take brief off and peed in bed.  This NT changed bed sheets and pts brief.

## 2023-03-08 DIAGNOSIS — E041 Nontoxic single thyroid nodule: Secondary | ICD-10-CM | POA: Diagnosis not present

## 2023-03-08 DIAGNOSIS — N390 Urinary tract infection, site not specified: Secondary | ICD-10-CM | POA: Diagnosis not present

## 2023-03-08 NOTE — ED Provider Notes (Signed)
-----------------------------------------   7:19 AM on 03/08/2023 -----------------------------------------   Blood pressure (!) 157/130, pulse 84, temperature 97.7 F (36.5 C), temperature source Axillary, resp. rate 18, SpO2 95 %.  The patient laying in bed with no complaints this morning.  There have been no acute events since the last update.  Awaiting disposition plan from case management/social work.    Nathaniel Man, MD 03/08/23 [phone]

## 2023-03-08 NOTE — ED Notes (Signed)
Pts bed changed and bed cleaned due to stool being on railings and wall. Pericare performed and new depends placed on pt.

## 2023-03-08 NOTE — ED Notes (Signed)
Pt continues to pull brief off and throw it. Unable to keep pt clean and dry.

## 2023-03-08 NOTE — ED Notes (Signed)
This tech changed pt and applied cleaned brief

## 2023-03-08 NOTE — ED Provider Notes (Signed)
-----------------------------------------   4:59 AM on 03/08/2023 -----------------------------------------   Blood pressure (!) 157/130, pulse 84, temperature 97.7 F (36.5 C), temperature source Axillary, resp. rate 18, SpO2 95 %.  The patient is calm and cooperative at this time.  There have been no acute events since the last update.  Awaiting disposition plan from Social Work team.   Paulette Blanch, MD 03/08/23 [phone]

## 2023-03-08 NOTE — ED Notes (Signed)
Pt alert, laying in bed calmly; pt regularly asks sitter for cookies; sitter states has told pt lunch will arrive soon. Pt in NAD.

## 2023-03-08 NOTE — ED Notes (Signed)
Pt incontinent of urine. Pt changed at this time as well as bed linen.

## 2023-03-09 DIAGNOSIS — E041 Nontoxic single thyroid nodule: Secondary | ICD-10-CM | POA: Diagnosis not present

## 2023-03-09 DIAGNOSIS — N390 Urinary tract infection, site not specified: Secondary | ICD-10-CM | POA: Diagnosis not present

## 2023-03-09 NOTE — ED Notes (Signed)
Pt soiled the bed EDT's [place] and [place] changed pt's briefs and bed linen. And gave pt a warm blanket pt is laying in the bed with sitter at bedside.

## 2023-03-09 NOTE — ED Notes (Signed)
Pt kept yelling "I'm hungry" EDT went and got pt a snack. Pt had apple juice and chocolate ice cream. Pt is laying in bed with sitter at bedside.

## 2023-03-09 NOTE — ED Notes (Signed)
Patient ate 100% breakfast per [place], NT

## 2023-03-09 NOTE — ED Notes (Signed)
Pt incontinent of stool at this time. This tech cleaned pt with peri wipes. Pt then urinated in the bed. Pt was cleaned and bed linen was changed. Two warm blankets placed on top of pt at this time. Pt with no further needs at this time.

## 2023-03-09 NOTE — ED Notes (Signed)
Patient given dinner tray. Patient ate 75% of meal. Patient now resting in bed.

## 2023-03-09 NOTE — ED Provider Notes (Signed)
-----------------------------------------   5:52 AM on 03/09/2023 -----------------------------------------   Blood pressure 124/87, pulse 85, temperature (!) 97.5 F (36.4 C), temperature source Axillary, resp. rate 16, SpO2 96 %.  The patient is calm and cooperative at this time.  There have been no acute events since the last update.  Awaiting disposition plan from Social Work team.   Paulette Blanch, MD 03/09/23 [phone]

## 2023-03-09 NOTE — ED Notes (Signed)
Pt incontinent of urine. Pt bed linen and brief changed at this time. Pt has no further needs.

## 2023-03-09 NOTE — ED Notes (Signed)
Pt's bedding was changed after an episode of urine incontinence. Patient was cooperative and tolerated well. Patient resting at this time.

## 2023-03-09 NOTE — ED Notes (Signed)
Patient ate 50% of lunch tray.

## 2023-03-09 NOTE — ED Notes (Signed)
Pt soiled brief and the bed. So this EDT and Stephanie (EDT) changed the pt's brief and sheets.

## 2023-03-09 NOTE — ED Notes (Signed)
Pt incontinent of stool. Pt cleaned with peri wipes and clean brief placed on pt. Pt removed brief and threw it in the floor and stated "fuck you bitch I am going to pee in the bed."

## 2023-03-09 NOTE — ED Notes (Signed)
VOL  TOC  PLACEMENT 

## 2023-03-10 DIAGNOSIS — E041 Nontoxic single thyroid nodule: Secondary | ICD-10-CM | POA: Diagnosis not present

## 2023-03-10 DIAGNOSIS — N390 Urinary tract infection, site not specified: Secondary | ICD-10-CM | POA: Diagnosis not present

## 2023-03-10 NOTE — ED Notes (Signed)
Family at bedside. 

## 2023-03-10 NOTE — ED Notes (Signed)
Pt stripping clothes off and trying to grab another pts food. Pt agitated. Staff attempted to calm pt with unsuccessful. Pt given PRN medication for agitation.

## 2023-03-10 NOTE — ED Notes (Signed)
This NT and Samantha NT completed a full linen and brief change on the patient. The patient tolerated well. Patient is now resting in bed.

## 2023-03-10 NOTE — ED Provider Notes (Signed)
Emergency Medicine Observation Re-evaluation Note  Janet Murphy is a 67 y.o. female, currently boarding in the emergency department.  No acute events since last update.  Physical Exam  BP 138/62 (BP Location: Left Arm)   Pulse 70   Temp 97.8 F (36.6 C) (Axillary)   Resp 17   SpO2 100%   ED Course / MDM   No recent lab work available for review  Plan  Current plan is for placement to an appropriate living facility once available.  Social work is currently working with the patient.Harvest Dark, MD 03/10/23 [phone]

## 2023-03-10 NOTE — ED Notes (Signed)
Pt ripped the burgundy scrub top that they had on. EDT put a gown on the pt and tied it in the back so it wouldn't come off so easily. Pt is laying in bed under the covers with EDT at bedside.

## 2023-03-10 NOTE — ED Notes (Signed)
Pt took brief off; had bm and urinated in bed. This tech cleansed bed and applied no linens, new brief and clean blanket.

## 2023-03-10 NOTE — ED Notes (Addendum)
Pt urinated on self; new brief placed.   This tech took report from [place], [place]- this tech now 1:1 sitter.

## 2023-03-10 NOTE — ED Notes (Signed)
Pt urinated and had bm in bed. Pt took off brief multiple times. Clean clothes placed on pt. Pt continuously taking pants and brief off and on.

## 2023-03-10 NOTE — TOC Progression Note (Signed)
Transition of Care [facility]) - Progression Note    Patient Details  Name: Janet Murphy MRN: [id number] Date of Birth: [birth year]  Transition of Care [facility]) CM/SW [place], [place] Phone Number: 03/10/2023, 2:24 PM  Clinical Narrative:     This CSW and TOC supervisor met with patient at bedside per husband request and explained to patient that DSS is guardian and do not think it is a safe plan for patient to return home with husband, pending placement. [place] has assessed patient and will determine their decision shortly per [facility] supervisor.   Expected Discharge Plan: Memory Care Barriers to Discharge: Continued Medical Work up  Expected Discharge Plan and Services   Discharge Planning Services: CM Consult   Living arrangements for the past 2 months: Single Family Home                 DME Arranged: N/A DME Agency: NA                   Social Determinants of Health (SDOH) Interventions SDOH Screenings   Tobacco Use: High Risk (02/08/2023)    Readmission Risk Interventions     No data to display

## 2023-03-10 NOTE — ED Notes (Signed)
Meal tray given pt ate about 90% of the food on the tray. Pt is laying in bed mumbling to self. EDT at bedside.

## 2023-03-10 NOTE — ED Notes (Signed)
Pt made the statement" I peed in the bed' and proceeded to take of her pants and the brief. EDT changed her briefs and placed the soiled in the the correct bin and gave pt a clean blanket. Once back in bed pt ripped off her scrub top once again. Pt is currently in bed mumbling to self with EDT at bedside.

## 2023-03-10 NOTE — ED Notes (Signed)
Pt continuously taking off brief and putting it back on.

## 2023-03-10 NOTE — ED Notes (Signed)
This tech and tech, [place], changed pt bed linen and helped clean up pt. Pt did have small amount of bowel movement. Pt has new pants on and brief.

## 2023-03-10 NOTE — ED Notes (Signed)
Pts husband left and pt got a little more antsy than before. Pt kept repeating " give me the cover" "give me the clothes". Pt was informed that they already had on clothes. During family vist pt ate cookies and drunk a pepsi.

## 2023-03-10 NOTE — ED Notes (Signed)
Pt ate only 2 cookies and sweet tea from dinner tray.

## 2023-03-10 NOTE — ED Notes (Signed)
Vol/TOC Placement

## 2023-03-10 NOTE — ED Notes (Signed)
Pt managed to take off the gown. Charge nurse was called for an extra set of scrubs. Pt saw the scrubs reached for them and put them on with no problem. PT is laying in bed with EDT at bedside.

## 2023-03-10 NOTE — ED Notes (Signed)
This NT put a new brief on the patient after the patient pulled off the old brief. Patient was cleaned up and tucked back into bed. Patient is resting at this time.

## 2023-03-10 NOTE — ED Notes (Signed)
This NT changed the patent's brief and bedding after an episode of incontinence. Patient was calm and cooperative.

## 2023-03-10 NOTE — ED Notes (Signed)
Patient's brief was changed and patient was sat up to eat breakfast. Patient ate 100% of breakfast tray.

## 2023-03-10 NOTE — ED Notes (Signed)
Pt kept trying to get out of the bed to attempt to grab another pt's food.  When she was told to stay in bed. Pt got upset and took off brief and pt peed in the bed because they were upset, pt also took off all of her clothes. And covered themselves in a blankets.    Prior to this when the pt's husband was here he asked for the social worker to come down. EDT notified the charge nurse. But husband left before he could see the Education officer, [place]. The social workers did indeed come down and explain to patient that they could not go home just yet and that they were waiting on placement for the pt.

## 2023-03-10 NOTE — ED Notes (Signed)
This tech just replaced pt bed linen and clothes with new brief. Pt was just given graham crackers and water.

## 2023-03-10 NOTE — ED Notes (Signed)
Peri care complete, changed linens, briefs and changed scrubs.

## 2023-03-11 DIAGNOSIS — N390 Urinary tract infection, site not specified: Secondary | ICD-10-CM | POA: Diagnosis not present

## 2023-03-11 DIAGNOSIS — E041 Nontoxic single thyroid nodule: Secondary | ICD-10-CM | POA: Diagnosis not present

## 2023-03-11 NOTE — ED Notes (Signed)
Pt urinated on self, new sheets and pad changed    This EDT took report from float sitter- this tech is now 1:1 sitter

## 2023-03-11 NOTE — ED Notes (Signed)
Patient is resting comfortably. 

## 2023-03-11 NOTE — ED Notes (Signed)
Pt had a BM in brief. Pt cleaned and a clean brief placed on pt. Bed linens also changed.

## 2023-03-11 NOTE — ED Notes (Signed)
Vol/TOC Placement

## 2023-03-11 NOTE — ED Notes (Addendum)
Pt is up and down out of bed asking for crackers  Snack has been provided along with water

## 2023-03-11 NOTE — TOC Progression Note (Addendum)
Transition of Care [facility]) - Progression Note    Patient Details  Name: Janet Murphy MRN: [id number] Date of Birth: 09-06-1956  Transition of Care [facility]) CM/SW Contact  Ross Ludwig, [place] Phone Number: 03/11/2023, 8:29 PM  Clinical Narrative:     CSW spoke to Toula Moos from [place], asking for update from [employer].  Per Teodoro Kil, they have not gotten back to her regarding placement options.  Barrie Lyme will follow up on Monday.  TOC also informed her that patient's husband was asking why she cannot come home with him, per DSS they are the legal guardian.  DSS to follow up with patient's husband.   Expected Discharge Plan: Memory Care Barriers to Discharge: Continued Medical Work up  Expected Discharge Plan and Services   Discharge Planning Services: CM Consult   Living arrangements for the past 2 months: Single Family Home                 DME Arranged: N/A DME Agency: NA                   Social Determinants of Health (SDOH) Interventions SDOH Screenings   Tobacco Use: High Risk (02/08/2023)    Readmission Risk Interventions     No data to display

## 2023-03-11 NOTE — ED Notes (Addendum)
Pt brief changed.  Pt asked what time it was and stated that she was hungry , pt given crackers and water.  Pt resting comfortably in bed.

## 2023-03-11 NOTE — ED Provider Notes (Signed)
-----------------------------------------   6:16 AM on 03/11/2023 -----------------------------------------   Blood pressure 138/62, pulse 70, temperature 97.8 F (36.6 C), temperature source Axillary, resp. rate 17, SpO2 100 %.  The patient is calm and cooperative at this time.  There have been no acute events since the last update.  Awaiting disposition plan from [facility] team.   Hinda Kehr, MD 03/11/23 [phone]

## 2023-03-11 NOTE — ED Notes (Signed)
Pt given graham crackers Pt linen changed and cleaned up, new brief placed and warm blankets applied. Tech at bedside. Pt denies needs at this time.

## 2023-03-12 NOTE — ED Notes (Signed)
This EDT with assist of two other staff helped change this pt. Pt not being cooperative during the process. Changed soiled linen and clothing. Notified RN assigned to this pt.

## 2023-03-12 NOTE — ED Notes (Signed)
This EDT and Verdis Frederickson, NT helped assist pt with showering and changing clothes. Pt cooperative in shower, Notified RN assigned to this pt.

## 2023-03-12 NOTE — ED Notes (Signed)
Per NT sitter pt finished with breakfast. Pt alert and calmly laying in bed currently.

## 2023-03-12 NOTE — ED Notes (Signed)
vol/toc placement.. 

## 2023-03-12 NOTE — ED Notes (Signed)
Pt urinated in bed; Mariah NT assisted pt to take shower; linens changed out by NT.

## 2023-03-12 NOTE — ED Notes (Signed)
Pt done with her lunch tray and drink. Pt resting in bed.

## 2023-03-12 NOTE — ED Notes (Signed)
Patient is vol pending placement 

## 2023-03-12 NOTE — ED Notes (Signed)
Pt provided with a pack of graham crackers and a cup of water at this time.

## 2023-03-12 NOTE — ED Notes (Signed)
Pt asleep; chest rise and fall noted; in NAD. NT sitter remains with pt.

## 2023-03-12 NOTE — ED Notes (Signed)
Pt urinated in brief. Pt cleaned and a clean brief placed on pt. Pt also provided with clean scrubs. Bed linen changed and pt provided with warm blankets.

## 2023-03-13 DIAGNOSIS — E041 Nontoxic single thyroid nodule: Secondary | ICD-10-CM | POA: Diagnosis not present

## 2023-03-13 DIAGNOSIS — N390 Urinary tract infection, site not specified: Secondary | ICD-10-CM | POA: Diagnosis not present

## 2023-03-13 NOTE — ED Notes (Signed)
Pt completely ripped her scrub top off. Pt is covered with a blanket to ensure that nothing inappropriate is showing. Pt keep stating that they "are cold" but pt is also refusing to keep top on. A new clean Scrub top was provided for the Pt. Pt is alert and mumbling amongst themselves EDT at bedside.

## 2023-03-13 NOTE — ED Notes (Signed)
Pt pulled their pants down and soiled themselves. Pt was changed and given clean dry pants to put on. Pt wouldn't allow the bed to dry after EDT wiped the bed down to clean it. Pt wouldn't allow Nurse or EDT to place the fitted sheet on the bed. Pt simple too the fitted sheet and used it as a blanket. A extra blanket was given to pt. Pt is currently laying in bed. Talking quietly amongst themselves. EDT as bedside.

## 2023-03-13 NOTE — ED Notes (Signed)
Patient is resting comfortably. 

## 2023-03-13 NOTE — ED Notes (Signed)
This tech took pt to the shower. Pt wiped down at this time. Linens changed by [employer]. Pt clothes & brief changed.

## 2023-03-13 NOTE — ED Notes (Signed)
Pt's Breakfast tray came EDT was able to get pt sit in the chair to eat a little bit. Pt did eat a whole banana a bite of eggs and oatmeal. Pt did get agitated because they couldn't lay in bed and eat. Pt got up from the chair and laid back in the bed. Pt's tray is still sitting in front of chair just incase they want more at a later time.

## 2023-03-13 NOTE — ED Provider Notes (Signed)
-----------------------------------------   5:13 AM on 03/13/2023 -----------------------------------------   Blood pressure 103/80, pulse 72, temperature 98.1 F (36.7 C), temperature source Oral, resp. rate 17, SpO2 98 %.  The patient is calm and cooperative at this time.  There have been no acute events since the last update.  Awaiting disposition plan from case management/social work.    Tiphany Fayson, Delice Bison, DO 03/13/23 [phone]

## 2023-03-13 NOTE — ED Notes (Signed)
Pt repeatedly taking brief off.

## 2023-03-13 NOTE — ED Notes (Signed)
Patient is currently sleeping at this time.

## 2023-03-13 NOTE — ED Notes (Signed)
Pt threw food on the floor at this time .

## 2023-03-13 NOTE — ED Notes (Signed)
TOC

## 2023-03-13 NOTE — ED Notes (Signed)
PATIENT CONTINUOUSLY KEEP TAKING OFF BRIEF. PATIENT RECEIVED LUNCH TRAY AND WAS THROWING FOOD EVERYWHERE.

## 2023-03-13 NOTE — ED Notes (Signed)
Pt peed in bed at this time. Pt had previously thrown brief on the floor.

## 2023-03-14 NOTE — ED Notes (Signed)
Pt only ate 5% of dinner. Did not like dinner- only the fruit.

## 2023-03-14 NOTE — ED Notes (Addendum)
Pt awake at this time, stating "I'm hungry" pt given apple sauce by this tech. Pt finishes the apple sauce and then throws the empty cup at the wall.   Pt then sees other 1H pt being fed by family members and states "give me food!" Over and over. Pt given a pack of graham crackers to eat.  Pt taking brief off and placing it back on multiple times. Pt asking for more crackers after finishing first pack.

## 2023-03-14 NOTE — ED Notes (Signed)
Pt is refusing to keep clothes on and keeps getting out of bed naked. This tech attempts to redirect pt. Pt says 'fuck you". Pt will get back in bed and does this cycle all again.

## 2023-03-14 NOTE — ED Notes (Addendum)
Pt is awake at this time repeating "I'm hungry I'm hungry". Pt breakfast has not arrived yet.

## 2023-03-14 NOTE — ED Notes (Addendum)
Pt given breakfast tray, ate 1 piece of sausage and put the tray underneathe her bed stating she was done. Tray set aside incase pt becomes hungry again. Pt is resting in bed now with eyes closed.   EVS swept trash around pt bed. Pt will not keep brief on, continues to take it off, place it back on and take it off again.

## 2023-03-14 NOTE — ED Notes (Signed)
This tech as 1:1 sitter for pt along with another pt. Pt is currently sleeping. Bilateral chest rise and fall noted.
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
## 2023-03-14 NOTE — ED Notes (Signed)
Pt will not keep brief on or blankets covering her. This tech is constantly having to tell her to put her brief on or cover herself with blankets. Pt will not keep pants on either. Pt keeps getting  out of bed with only her shirt on.

## 2023-03-14 NOTE — ED Notes (Signed)
Pt's bed was changed as pt had peed the bed. PT given a clean brief (that she won't keep on) and fresh warm blankets.

## 2023-03-14 NOTE — ED Notes (Signed)
Pt given chocolate milk by RN. Pt sat up in bed to drink. Pt hands this tech the empty cup to throw away. Pt lays back in bed to rest

## 2023-03-14 NOTE — ED Notes (Signed)
Pts bed linen changed and new brief applied by this tech and Legrand [place], Therapist, sports.  Pt given warm blankets; no other needs voiced at this time.

## 2023-03-14 NOTE — TOC Progression Note (Signed)
Transition of Care [facility]) - Progression Note    Patient Details  Name: Janet Murphy MRN: [id number] Date of Birth: 1956/09/19  Transition of Care [facility]) CM/SW Contact  Ross Ludwig, [place] Phone Number: 03/14/2023, 6:13 PM  Clinical Narrative:     Discussion with [place] regarding placement for patient.  Per [facility] feels patient's behaviors are still not well controlled by medications.  They would like psych to see her again and make some medication adjustments.  DSS guardianship paperwork has also been sent to registration so they an update the paperwork in the system.  TOC to continue to follow patient's progress throughout discharge planning.  Expected Discharge Plan: Memory Care Barriers to Discharge: Continued Medical Work up  Expected Discharge Plan and Services   Discharge Planning Services: CM Consult   Living arrangements for the past 2 months: Single Family Home                 DME Arranged: N/A DME Agency: NA                   Social Determinants of Health (SDOH) Interventions SDOH Screenings   Tobacco Use: High Risk (02/08/2023)    Readmission Risk Interventions     No data to display

## 2023-03-14 NOTE — ED Notes (Signed)
Full linen and brief changed at this time.  °

## 2023-03-14 NOTE — ED Notes (Signed)
This tech as 1:1 sitter. Pt asleep at this time.

## 2023-03-14 NOTE — ED Notes (Signed)
Pt took off brief again. Pt continuously jumping over rail. Attempting to redirect pt.

## 2023-03-14 NOTE — ED Notes (Addendum)
Pt stating "I'm hungry" again, pt given a cup of applesauce by this tech. Pt finishes food and gives to tech to throw away. Janet Murphy is discarded, pt requests "fix the bed" this tech lays head of bed back for pt, pt states "I go to bed now"  ~0800 pt removed brief with visible stool in it and throws it between wall and bed. Pt then picks it up and attempts to put it back on, this tech brings trash can over to bed and pt throws away the brief. Pt has stool on bed sheet and legs. Pt will not let this tech change the bed sheets, and pt will not move to the chair so tech can change her bed.  ~08100 [name] to bedside. RN and this tech get pt to sit in chair beside bed and change sheets. New sheet and chux pad placed. New brief applied and new warm blanket given. Pt in bed stating "good night I'm dreaming"

## 2023-03-14 NOTE — ED Notes (Signed)
This tech is sitting with pt while 1:1 is on their break

## 2023-03-14 NOTE — ED Notes (Signed)
Pt awake and is taking brief off. Pt has got out of bed multiple times trying to find food. Pt informed that dinner will be here shortly.

## 2023-03-14 NOTE — ED Notes (Signed)
Pt given lunch tray. Pt ate 25% off tray then wanted head of ved put down so she could rest.

## 2023-03-15 DIAGNOSIS — E041 Nontoxic single thyroid nodule: Secondary | ICD-10-CM | POA: Diagnosis not present

## 2023-03-15 DIAGNOSIS — N390 Urinary tract infection, site not specified: Secondary | ICD-10-CM | POA: Diagnosis not present

## 2023-03-15 NOTE — ED Notes (Signed)
Pt given graham crackers and water 

## 2023-03-15 NOTE — ED Provider Notes (Signed)
-----------------------------------------   6:06 AM on 03/15/2023 -----------------------------------------   Blood pressure 129/81, pulse 87, temperature 97.7 F (36.5 C), temperature source Oral, resp. rate 18, SpO2 96 %.  The patient is calm and cooperative at this time.  There have been no acute events since the last update.  Awaiting disposition plan from Social Work team.   Paulette Blanch, MD 03/15/23 [phone]

## 2023-03-15 NOTE — ED Notes (Signed)
Dorian, RN, sat with pt and combed her hair to deescalate pt. Pt heard yelling out "let the horses run" while RN was brushing pts hair. RN continued to talk calmly as pt yelled. Pt began to quiet down and proceeded to be still as RN continued to comb pts hair.   Pt now resting in bed. Pt no longer yelling out. Breathing unlabored, with equal chest rise and fall noted. Safety sitter at bedside.

## 2023-03-15 NOTE — ED Notes (Signed)
Lunch was provided to pt. Pt got upset because EDT refused to let pt eat lying on their back. EDT explained to pt that once they were done eating lunch they could lay completely down. Pt is sitting in fowlers position with a mouth full of cookies. PT kept yelling "fix this bed" "fix my bed" but pt also kept eating their lunch. EDT at bedside

## 2023-03-15 NOTE — ED Notes (Signed)
Pt bed was soaked. This tech and tech, [place],  helped change pt sheets, clothes and put on new brief.

## 2023-03-15 NOTE — ED Provider Notes (Signed)
-----------------------------------------   7:54 PM on 03/15/2023 -----------------------------------------   Blood pressure (!) 152/82, pulse 72, temperature 98.6 F (37 C), temperature source Axillary, resp. rate 16, SpO2 99 %.  The patient is resting comfortably in bed at this time.  There have been no acute events since the last update.  Awaiting disposition plan from the Social Work team.    Arta Silence, MD 03/15/23 [phone]

## 2023-03-15 NOTE — ED Notes (Signed)
Pt. Laying in bed, sleeping, NAD. Chest rise and fall. Safety observation at bedside.

## 2023-03-15 NOTE — ED Notes (Signed)
Pt stating she wet the bed repeatedly. Clothes and bed linens changed at this time

## 2023-03-15 NOTE — ED Notes (Signed)
Pt has tried multiple times to jump out of bed. Pt also throwing pillow back and forth in pts bed. Pt covers herself with her blanket and yells out "see the horses run...fuck you, fuck that, fuck you bitch...driving in the car".

## 2023-03-15 NOTE — ED Notes (Signed)
Pt received breakfast but only ate banana and juice. Pt might do better with pancakes

## 2023-03-15 NOTE — ED Notes (Signed)
Vol / pending TOC placement 

## 2023-03-16 DIAGNOSIS — N390 Urinary tract infection, site not specified: Secondary | ICD-10-CM | POA: Diagnosis not present

## 2023-03-16 DIAGNOSIS — E041 Nontoxic single thyroid nodule: Secondary | ICD-10-CM | POA: Diagnosis not present

## 2023-03-16 MED ORDER — LORAZEPAM 2 MG/ML IJ SOLN
1.0000 mg | Freq: Once | INTRAMUSCULAR | Status: AC
  Administered 2023-03-16: 1 mg via INTRAMUSCULAR
  Filled 2023-03-16: qty 1

## 2023-03-16 NOTE — ED Notes (Signed)
Pt took off brief and proceeded to urinate in the bed, this tech changed sheet and cleaned pt and applied new brief

## 2023-03-16 NOTE — ED Notes (Signed)
Assigned to pt, 1:1 sitter.  Pt asleep at this time.

## 2023-03-16 NOTE — ED Provider Notes (Signed)
-----------------------------------------   5:12 AM on 03/16/2023 -----------------------------------------   Blood pressure 105/67, pulse 78, temperature 98.4 F (36.9 C), temperature source Oral, resp. rate 16, SpO2 98 %.  The patient is calm and cooperative at this time.  There have been no acute events since the last update.  Awaiting disposition plan from case management/social work.    Treylan Mcclintock, Delice Bison, DO 03/16/23 [phone]

## 2023-03-16 NOTE — ED Notes (Signed)
ThisTech gave patient food tray.

## 2023-03-16 NOTE — ED Notes (Signed)
vol/toc placement.. 

## 2023-03-17 DIAGNOSIS — E041 Nontoxic single thyroid nodule: Secondary | ICD-10-CM | POA: Diagnosis not present

## 2023-03-17 DIAGNOSIS — N390 Urinary tract infection, site not specified: Secondary | ICD-10-CM | POA: Diagnosis not present

## 2023-03-17 NOTE — ED Notes (Addendum)
Pt urinated in brief. Pt cleaned and a clean brief placed on pt. Bed linens also changed and a clean chucks pad placed on bed.

## 2023-03-17 NOTE — ED Notes (Addendum)
Pt asked what the date is, this Probation officer informed the pt of today's date and of the time of day, pt stated "I've been here a month, I'm ready to go home". Pt is now attempting to go back to sleep.

## 2023-03-17 NOTE — ED Notes (Signed)
Pt urinated in brief. Pt cleaned and a clean brief placed on pt. Bed linens also changed at this time.

## 2023-03-17 NOTE — ED Notes (Signed)
Pt had soiled the bed. EDT attempted to clean it Pt refused to cooperate with EDT. Pt refused to fully allow EDT to clean them. Pt refused to put on brief and keep it on. Pt is currently laying down with warm blankets. EDT at bedside.

## 2023-03-17 NOTE — ED Notes (Signed)
Pt increasingly agitated and getting out of bed. RN advised MD Cheri Fowler and MD states ok to give pt PRN zyprexa.

## 2023-03-17 NOTE — ED Notes (Signed)
Pt awake at this time and asking for food. Pt given a pack of graham crackers and a cup of water.

## 2023-03-17 NOTE — ED Notes (Signed)
Pt just took off brief and urinated on herself and the bed and also pooped on the bed. Pt was given a chuck and new brief. Pt has opened the exit door numerous times today in attempt to leave.

## 2023-03-17 NOTE — ED Notes (Signed)
Patient cleaned up after an episode of urinary incontinence. A new brief was applied and the bed pad was changed.

## 2023-03-17 NOTE — ED Notes (Signed)
Pt. woke up. Pt. given breakfast tray.

## 2023-03-17 NOTE — ED Notes (Signed)
Pt urinated in brief. Pt cleaned and a clean brief placed on pt.

## 2023-03-17 NOTE — ED Notes (Signed)
Patient provided dinner tray. Patient ate about 50% of tray.

## 2023-03-17 NOTE — ED Notes (Signed)
Pt stating "I'm hungry", Pt provided with a pack of graham crackers and a cup of water at this time.

## 2023-03-17 NOTE — ED Notes (Signed)
Assumed care from [facility]. Pt resting comfortably in bed at this time. Safety sitter at bedside.

## 2023-03-17 NOTE — ED Notes (Signed)
Per previous note. Pt did not want tier food and kept spiting it out and throwing it. Pt since then has being trying to get another pt's food. Pt has gotten out of bed numerous times. Pt continue to takes off their shirt and still refused to keep on brief. Patient verbally notified staff that they were upset per Pt " fuck you bitch" "stupid bitch" Pt also continued to point at another pt's food stating " hey give me that food" repeatedly. Pt is currently laying in bed completely naked due to their refusal to keep anything on.

## 2023-03-17 NOTE — ED Notes (Signed)
While this writer was with another pt, this pt got out of bed and began to walk down hall. Pt was able to be redirected in direction of bed. Pt took brief off while standing beside bed. Pt asked to sit back in bed and told that this writer will be right over to assist pt.

## 2023-03-17 NOTE — ED Notes (Signed)
Pt becoming more agitated. RN at bedside to administer PRN PO ativan.

## 2023-03-17 NOTE — ED Notes (Signed)
Pt. had bowel movement in the bed. Cleaned up pt. Put new sheets on the bed and changed pt. with nurse.

## 2023-03-17 NOTE — ED Notes (Signed)
Pt's lunch tray came and EDT gave pt their food. Pt verbally stated they did not like the food pt was throwing food and spitting the food out. Pt gave EDT the plate and said "Eww throw it away" EDT did so per pt request. Pt still is refusing to keep underwear and shirt on. Pt takes off her clothes and then tries to walk away from their bed.

## 2023-03-17 NOTE — ED Notes (Addendum)
Pt ripped brief off, stating "gimme cover". New brief placed on pt and pt provided with warm blankets.

## 2023-03-17 NOTE — ED Notes (Signed)
Pt stood out of bed and took brief off. Pt then began to walk down hallway. Pt redirected back to bed and brief placed back on pt.

## 2023-03-17 NOTE — ED Notes (Signed)
Pt has taken off their brief four times. Pt will take off the brief and the purposely use the bathroom as they please and then inform EDT of what they did. Every time a brief is placed on pt they take it off continuously.

## 2023-03-17 NOTE — ED Notes (Signed)
Pt. urinated in the bed. Cleaned pt. up and put new linens on the bed.

## 2023-03-17 NOTE — ED Notes (Signed)
TOC

## 2023-03-17 NOTE — ED Provider Notes (Signed)
-----------------------------------------   3:38 AM on 03/17/2023 -----------------------------------------   Blood pressure (!) 124/59, pulse (!) 53, temperature 98.4 F (36.9 C), temperature source Oral, resp. rate 14, SpO2 96 %.  The patient is calm and cooperative at this time.  There have been no acute events since the last update.  Awaiting disposition plan from [facility] team.   Hinda Kehr, MD 03/17/23 [phone]

## 2023-03-18 DIAGNOSIS — N390 Urinary tract infection, site not specified: Secondary | ICD-10-CM | POA: Diagnosis not present

## 2023-03-18 DIAGNOSIS — E041 Nontoxic single thyroid nodule: Secondary | ICD-10-CM | POA: Diagnosis not present

## 2023-03-18 NOTE — ED Notes (Signed)
Pt resting comfortably, RR even and unlabored 

## 2023-03-18 NOTE — ED Notes (Signed)
Pt ate approx. 30% of food tray. Pt resting comfortably at this time.

## 2023-03-18 NOTE — ED Notes (Signed)
TOC

## 2023-03-18 NOTE — ED Notes (Signed)
This NT provided pt graham crackers and water. Pt is content at this time.

## 2023-03-18 NOTE — ED Notes (Signed)
Pt continues to take brief off and throw on floor. This NT changed brief and bed sheet after pt took off brief and urinated and had bowel movement in bed. Pt is content at this time.

## 2023-03-18 NOTE — ED Notes (Signed)
Pt urinated in brief. Pt cleaned and a clean brief placed on pt. Warm blankets provided to pt.

## 2023-03-18 NOTE — ED Notes (Signed)
This NT changed pts brief. Pt had urinated and had bowel movement. Pt is currently yelling " take me to heaven" and "hey jesus".

## 2023-03-18 NOTE — ED Notes (Signed)
Pt sleeping at this time, respirations even and unlabored.

## 2023-03-18 NOTE — ED Notes (Signed)
Vol  pending  TOC  placement 

## 2023-03-18 NOTE — ED Notes (Signed)
Pt sated "give me food", pt given a pack of graham crackers and a cup of water at this time.

## 2023-03-18 NOTE — ED Provider Notes (Signed)
-----------------------------------------   5:11 AM on 03/18/2023 -----------------------------------------   Blood pressure (!) 124/59, pulse (!) 53, temperature 98 F (36.7 C), resp. rate 14, SpO2 96 %.  The patient is calm and cooperative at this time.  There have been no acute events since the last update.  Awaiting disposition plan from case management/social work.    Nathaniel Man, MD 03/18/23 [phone]

## 2023-03-18 NOTE — ED Notes (Signed)
Pt continues to take off briefs after putting new ones on.

## 2023-03-18 NOTE — TOC Progression Note (Signed)
Transition of Care [facility]) - Progression Note    Patient Details  Name: Janet Murphy MRN: [id number] Date of Birth: 09-Jul-1956  [facility] CM/SW Contact  Ross Ludwig, [place] Phone Number: 03/17/2023, 3:13 PM  Clinical Narrative:     Per [facility] has not made a decision if they can accept patient or not.  TOC continuing to follow patient's progress throughout discharge planning.    Expected Discharge Plan: Memory Care Barriers to Discharge: Continued Medical Work up  Expected Discharge Plan and Services   Discharge Planning Services: CM Consult   Living arrangements for the past 2 months: Single Family Home                 DME Arranged: N/A DME Agency: NA                   Social Determinants of Health (SDOH) Interventions SDOH Screenings   Tobacco Use: High Risk (02/08/2023)    Readmission Risk Interventions     No data to display

## 2023-03-18 NOTE — ED Notes (Signed)
Pt walking around with no briefs, stool on bed and smeared on pt's arms, legs and buttocks. Pt very agitated. Pt taken to decon for shower, gown changed. Linens changed. Given meds. Pt given breakfast.  Pt saying "fuck you bitch" to staff

## 2023-03-19 DIAGNOSIS — N390 Urinary tract infection, site not specified: Secondary | ICD-10-CM | POA: Diagnosis not present

## 2023-03-19 DIAGNOSIS — E041 Nontoxic single thyroid nodule: Secondary | ICD-10-CM | POA: Diagnosis not present

## 2023-03-19 NOTE — ED Notes (Signed)
Pt was getting very agitated and was getting out of bed and being verbally abusive to staff stating "fuck you". Medication was given and pt had a completed linen change.

## 2023-03-19 NOTE — ED Provider Notes (Signed)
-----------------------------------------   5:20 AM on 03/19/2023 -----------------------------------------   Blood pressure (!) 124/59, pulse (!) 53, temperature 98 F (36.7 C), resp. rate 14, SpO2 96 %.  The patient is calm and cooperative at this time.  There have been no acute events since the last update.  Awaiting disposition plan from Social Work team.   Paulette Blanch, MD 03/19/23 [phone]

## 2023-03-19 NOTE — ED Notes (Signed)
Pt was fed dinner by ED tech.

## 2023-03-19 NOTE — ED Notes (Addendum)
Spouse visiting with patient at this time. Pt is able to recognize husband and states his name. Pt is calm at this time. Pt appears to be acting same as she has recently while boarding by repeating questions over and over again very quickly but also appears to be having a conversation with spouse.   Spouse has brought pt some candy, marshmallows, a cookie and pepsi soda. Pt is asking spouse where she is going and if he will pick her up. Spouse is brushing pt hair at this time.

## 2023-03-19 NOTE — ED Notes (Signed)
Pt awake now and eating breakfast. Offered to get her into recliner, she just wants to eat while sitting in bed.

## 2023-03-19 NOTE — ED Notes (Signed)
Pt brief changed by this tech

## 2023-03-19 NOTE — ED Provider Notes (Signed)
Emergency Medicine Observation Re-evaluation Note  Janet Murphy is a 67 y.o. female, currently boarding in the emergency department awaiting placement to an appropriate facility.  No acute events since last update.  Physical Exam  BP (!) 117/94 (BP Location: Left Arm)   Pulse 80   Temp (!) 97.5 F (36.4 C) (Axillary)   Resp 18   SpO2 96%    ED Course / MDM  No recent lab work for review.  Plan  Current plan is for placement to an appropriate living facility once available.    Harvest Dark, MD 03/19/23 [phone]

## 2023-03-19 NOTE — ED Notes (Signed)
First encounter with pt. Pt is sleeping.

## 2023-03-20 NOTE — ED Notes (Signed)
Hospital dinner tray and drink provided to pt. Meal partially consumed until pt stated "I'm going to bed. Good night". Will offer food again at a later time.

## 2023-03-20 NOTE — ED Notes (Signed)
Pt started getting agitated and yelling "fucking bitch, you fucking bitch" PRN medication administered and sitter continues to be at bedside.

## 2023-03-20 NOTE — ED Notes (Signed)
Patient is eating breakfast at this time. 

## 2023-03-20 NOTE — ED Notes (Signed)
Family at bedside from approximately 17:15-17:33

## 2023-03-20 NOTE — ED Notes (Signed)
Given lunch tray.

## 2023-03-20 NOTE — ED Notes (Signed)
Pt brief changed and pt repositioned in bed. Sitter at bedside.

## 2023-03-20 NOTE — ED Notes (Signed)
This tech as 1:1 sitter for this pt and another pt. Pt resting at this time, RR even and unlabored.

## 2023-03-20 NOTE — ED Notes (Addendum)
Patient is sleeping comfortably at this time 

## 2023-03-20 NOTE — ED Notes (Signed)
Pt resting. RR even and unlabored.

## 2023-03-20 NOTE — ED Notes (Signed)
Pt went from resting with eyes closed, even, non-labored respirations, to suddenly jumping over the side rails and standing up beside the bed repeating "I'm driving a car" multiple times. Pt redirected back in bed by this EDT. Pt began attempting to rip brief off. EDT checked pts brief and brief was still clean and dry. No sign or urination or BM. Pt again attempted to get out of bed. EDT redirected pt again.   Other pt in 1 Hall was then seen with both legs hanging over the side of the bed. EDT was forced to direct more attention towards other pt to prevent that pt from hurting himself. This pt was still attempting to get out of bed at this time, but listened to verbal instructions to stay in bed.  Pt seen at 18:40 laying in bed with 2 blankets pulled up to her chin, eyes closed, even chest rise and fall, and unlabored respirations.

## 2023-03-21 DIAGNOSIS — E041 Nontoxic single thyroid nodule: Secondary | ICD-10-CM | POA: Diagnosis not present

## 2023-03-21 DIAGNOSIS — N390 Urinary tract infection, site not specified: Secondary | ICD-10-CM | POA: Diagnosis not present

## 2023-03-21 NOTE — ED Provider Notes (Signed)
-----------------------------------------   5:17 AM on 03/21/2023 -----------------------------------------   Blood pressure (!) 164/104, pulse 88, temperature 97.8 F (36.6 C), temperature source Oral, resp. rate 18, SpO2 99 %.  The patient is calm and cooperative at this time.  There have been no acute events since the last update.  Awaiting disposition plan from Social Work team.   Paulette Blanch, MD 03/21/23 [phone]

## 2023-03-21 NOTE — ED Notes (Signed)
Patient had breakfast. Is now resting comfortably

## 2023-03-21 NOTE — ED Notes (Signed)
Pt consumed about 20% of meal tray at this time.

## 2023-03-21 NOTE — ED Notes (Signed)
Patient is resting comfortably. 

## 2023-03-21 NOTE — ED Notes (Signed)
Pt brief changed.  

## 2023-03-21 NOTE — ED Notes (Addendum)
This tech sitting with pt not 1:1 because sitting with another pt as well. Pt is sleeping and resting.

## 2023-03-21 NOTE — ED Notes (Signed)
This tech, and Verdis Frederickson, EDT tech helped shower pt and pt is now back in bed

## 2023-03-21 NOTE — ED Notes (Signed)
This tech just changed pt brief

## 2023-03-21 NOTE — ED Notes (Signed)
vol/toc placement.. 

## 2023-03-21 NOTE — ED Notes (Signed)
Pt awake and just finished lunch tray.

## 2023-03-21 NOTE — ED Notes (Signed)
Pt brief and bed linen changed.  

## 2023-03-21 NOTE — ED Notes (Signed)
Pt took off brief and covered herself back up. Pt then uncovered and started smearing feces on her bed railing and linen. Pt cleaned up along with bed, new linen placed on bed. Pt has on new brief and blankets. Pt said "goodnight" and is now resting comfortably in bed.

## 2023-03-22 DIAGNOSIS — E041 Nontoxic single thyroid nodule: Secondary | ICD-10-CM | POA: Diagnosis not present

## 2023-03-22 DIAGNOSIS — N390 Urinary tract infection, site not specified: Secondary | ICD-10-CM | POA: Diagnosis not present

## 2023-03-22 NOTE — ED Notes (Signed)
EDT set up hospital dinner and positioned the head of the bed upright to facilitate eating. Pt sat up in bed and repeated "it's morning..I'm hungry...give me food, give me food..." EDT cut pts regular diet tray into bite sized pieces and fed pt one bite of grilled chicken. Pt ate the chicken. EDT repeated this for a couple of bites with sips of tea in between bites. EDT gave pt a bite of rice on a spoon and pt spit it out at EDT saying "Gross...that's gross..I'm not hungry." EDT cleaned up rice that went over EDT pants and shirt. Pt encouraged to eat another small bite of chicken. Pt spit it out into her hand and threw it as well.    EDT gave pt a break but kept pt sitting up. EDT offered some fresh fruit from pts tray approximately 15 minutes after pts chicken and rice. EDT gave pt a small bite of cantalope. Pt spit it out at this EDT just as she did with her food previously. EDT asked pt if she wanted any more food. Pt stated "No, I'm not hungry. Good night. I'm going to bed, Good night. I'm not hungry."  Pt left sitting up in bed to promote safety after eating. Hospital dinner tray thrown away due to negative results attempting to feed pt and due to pt stating she wanted no more food. EDT remains at bedside.

## 2023-03-22 NOTE — ED Notes (Signed)
Patient cleaned up with this NT and Megan NT. New brief placed on patient. Patient now resting at this time.

## 2023-03-22 NOTE — ED Notes (Addendum)
Pt continuously undressing and stripping off all clothes and brief. This comes after pt dug in her soiled brief and threw a handful of poop towards this EDT, unprovoked. EDT assisted pt in a total bed change and bed bath to clean the poop off the pts hand, nails, legs, bottom, and private area.   Pt provided all new linens, three chux pads, clean brief, and clean psych pt scrub top. Pt also provided mesh panties to go overtop of the brief to encourage pt to keep brief on. Safety sitter remains at bedside.

## 2023-03-22 NOTE — ED Notes (Signed)
This Probation officer with Janet Murphy assisted the patient with changing new brief and bed sheets, patient repositioned in bed.

## 2023-03-22 NOTE — ED Notes (Signed)
Patient is currently resting at this time.  

## 2023-03-22 NOTE — ED Provider Notes (Signed)
-----------------------------------------   6:29 AM on 03/22/2023 -----------------------------------------   Blood pressure (!) 164/104, pulse 88, temperature 97.8 F (36.6 C), temperature source Oral, resp. rate 18, SpO2 99 %.  The patient is calm and cooperative at this time.  There have been no acute events since the last update.  Awaiting disposition plan from case management/social work.    Keyona Emrich, Delice Bison, DO 03/22/23 [phone]

## 2023-03-22 NOTE — ED Notes (Signed)
Pt had urinated and had a bowel movement in brief. Pt proceeded to take brief off and urine and feces on bed linen. This NT changed pts brief, sheets, blankets, and gown.

## 2023-03-22 NOTE — ED Notes (Signed)
Pt cleaned up with this EDT and EDT Mel. New brief provided and pt changed. Pt given crackers and is resting at this time.

## 2023-03-22 NOTE — ED Notes (Signed)
Head of bed laid down, pts brief changed, peri care performed, pt repositioned in bed, and bedside tidied up. Bed rails up for pt safety. Safety sitter at bedside.

## 2023-03-22 NOTE — ED Notes (Signed)
Family at bedside from 78:30-17:48

## 2023-03-22 NOTE — ED Notes (Signed)
This writer attempted in collecting pt vital signs, pt refused and took cuff off and pulse ox. Was only able to obtain Ax temp.

## 2023-03-23 DIAGNOSIS — N390 Urinary tract infection, site not specified: Secondary | ICD-10-CM | POA: Diagnosis not present

## 2023-03-23 DIAGNOSIS — E041 Nontoxic single thyroid nodule: Secondary | ICD-10-CM | POA: Diagnosis not present

## 2023-03-23 NOTE — ED Notes (Signed)
Gave pt peanut butter graham crackers and drink. Pt consumed 100% of snack.

## 2023-03-23 NOTE — ED Notes (Signed)
Patient was given a shower. Patient has clean brief, clean linen and a clean gown.

## 2023-03-23 NOTE — ED Notes (Signed)
Pt continues to throw briefs, pt redirected and this tech applied new brief. Pt then stated " leave me lone... leave me lone ... Bitch"

## 2023-03-23 NOTE — ED Notes (Signed)
Patient  in bed free from sign of distress. Breathing unlabored speaking in full sentences with symmetric chest rise and fall. Bed low and locked with side rails raised x2. Sitter at bedside.

## 2023-03-23 NOTE — ED Notes (Signed)
Pt is now up consistently trying to get out of bed. Keeps taking off briefs and throwing them.

## 2023-03-23 NOTE — ED Notes (Signed)
EDT came back from cleaning this pts smeared poop off EDTs pants from previous incident. Safety mitts applied to pts hands to promote safety for pt and EDT staff. Pt proceeded to use her teeth to rip off the safety mitts and throw them at the end of the bed. Pt resting in bed, respirations even and unlabored, with no safety mitts. EDT at bedside as safety sitter at this time.

## 2023-03-23 NOTE — ED Notes (Signed)
Pt jumped over railing of the bed and headed towards to exit door directly behind pts bed. Pt attempted to p[ush open exit door before pt got redirected away from the door. EDT guided pt back to her bed. Pt kicking end of her bed and yelling "fuck you bitch..I'm hungry.. fuck you..I'm uncovering.Marland KitchenMarland KitchenI'm going to bed"  Pt proceeded to stand beside bed with EDT, pull down her scrub pants, and wet herself. Pt cleaned up and provided with new scrub pants. Pt refuses to keep a brief on at this time. Will attempt again when pt is more receptive to staying clothed. EDT remains at bedside

## 2023-03-23 NOTE — ED Notes (Signed)
Family member left around approximately 17:40 after pt told family member "I'm going to bed now".  Pt laying in bed covered with a blanket. Pt still refuses to wear a shirt, but has kept on pants and brief for the time being. Pts respirations are even and unlabored. EDT safety sitter at bedside monitoring pt.

## 2023-03-23 NOTE — ED Notes (Signed)
Pt yelling out "Thank you bitch" and "Hey God...who's in the garage...good night...I hate you.Marland Kitchen.fuck you bitch"  This is following the application of a new brief due to the pt ripping off the previous brief applied by this EDT.

## 2023-03-23 NOTE — ED Provider Notes (Signed)
Emergency Medicine Observation Re-evaluation Note  Physical Exam   BP (!) 164/104 (BP Location: Left Arm)   Pulse 88   Temp 98.3 F (36.8 C) (Axillary)   Resp 18   SpO2 99%   Patient appears in no acute distress.  ED Course / MDM   No reported events during my shift at the time of this note.   Pt is awaiting dispo from SW   Lucillie Garfinkel MD    Lucillie Garfinkel, MD 03/23/23 [phone]

## 2023-03-23 NOTE — ED Notes (Signed)
Family at bedside. 

## 2023-03-23 NOTE — ED Notes (Signed)
This EDT assumed the safety sitting role of this pt. Pt was seen completely naked in bed, with no incontinence pad or brief. EDT performed pericare on pt, placed a clean chux, clean brief, scrub pants, and a fresh blanket for pts privacy, due to pt being out in the hallway.   Pt continues to pull off clothes and brief. EDT helps pt put clothes and brief back on to promote a clean and private environment. EDT sitting within arms reach of pt due to pt throwing clothes and jumping up out of bed. EDT will attempt to ambulate pt once she is willing to put on clothes. Safety sitter at bedside.

## 2023-03-23 NOTE — ED Notes (Signed)
This tech changed pt and applied new brief

## 2023-03-23 NOTE — ED Notes (Addendum)
Complete bed change. Pt incontinent of urine. New brief placed pt. Pt urinated again. Brief replaced. Blankets provided. Pt will not keep gown or brief on. Pt calling out "Take me to heaven" Pt trying to get out of bed.

## 2023-03-23 NOTE — ED Notes (Signed)
EDT sat bed up to facilitate eating. Hospital dinner tray fed to pt one, small bite at a time with sips of liquid in between bites. Meal and liquid 100% consumed. Meal tray disposed of properly. EDT still at bedside.

## 2023-03-23 NOTE — ED Notes (Signed)
Pt attempted to get up and walk off twice, this tech redirected pt and got her back into bed. Pt laying down at this time.

## 2023-03-23 NOTE — ED Notes (Signed)
Pt stood up out of bed and stood by EDT. Pt proceeded to pull down her scrub pants, reach both hands around to her rectum, and pull two large handfulls of poop out. Pt then attempted to throw poop and smear poop on this EDT. EDT used STARR technique to prevent pt from contaminating this EDT with her feces. Pt assisted back to bed and pts arms prevented from touching this EDT until Deneise Lever, Therapist, sports, arrived to help. At this point, pt had already smeared poop all over the bed, bed railings, and blankets, Pt had poop all over her nails and both hands.  Pt cleaned up by this EDT and Deneise Lever, Therapist, sports. Pts whole bed changed, new brief, chux, and scrub pants applied. Complete pericare, full body bed bath, and nail care performed to remove feces from this pt and her environment .New pillow case and blanket provided due to poop being smeared all over the previous linens. EDT remains at bedside as Air cabin crew.

## 2023-03-23 NOTE — ED Notes (Signed)
This tech and [employer] changed pt as she took off brief and proceeded to smear her feces on the bed and wall and sheet. Both techs gathered supplies and cleaned up pt and changed bedding and provided clean and dry clothing and brief. Pt now laying back in bed "resting"

## 2023-03-24 DIAGNOSIS — N390 Urinary tract infection, site not specified: Secondary | ICD-10-CM | POA: Diagnosis not present

## 2023-03-24 DIAGNOSIS — E041 Nontoxic single thyroid nodule: Secondary | ICD-10-CM | POA: Diagnosis not present

## 2023-03-24 NOTE — ED Notes (Signed)
Patient did receive her lunch at this time. Patient did not want her lunch. Patient kept spitting out her food. Patient did eat her peaches.

## 2023-03-24 NOTE — ED Notes (Signed)
Family at bedside. 

## 2023-03-24 NOTE — ED Notes (Signed)
Patient  in bed free from sign of distress. Breathing unlabored speaking in full sentences with symmetric chest rise and fall. Bed low and locked with side rails raised x2. Sitter at bedside.

## 2023-03-24 NOTE — ED Notes (Signed)
This RN cleaned pt of incontinence, linens changed. Pt resting comfortably at this time. Safety sitter at bedside.

## 2023-03-24 NOTE — ED Notes (Signed)
Pt awake trying to get out of bed. Nt informed pt not to get up bed.

## 2023-03-24 NOTE — ED Notes (Signed)
Pt given graham crackers per pt request

## 2023-03-24 NOTE — ED Notes (Signed)
Pt only ate the cookies off of her lunch tray. Pt refused to put on brief.

## 2023-03-24 NOTE — ED Provider Notes (Signed)
Emergency Medicine Observation Re-evaluation Note  Janet Murphy is a 67 y.o. female, seen on rounds today.  Pt initially presented to the ED for complaints of IVC  Currently, the patient is calm, no acute complaints.  Physical Exam  Blood pressure 107/69, pulse 87, temperature 98.1 F (36.7 C), temperature source Oral, resp. rate 17, SpO2 96 %. Physical Exam General: NAD Lungs: CTAB Psych: not agitated  ED Course / MDM  EKG:    I have reviewed the labs performed to date as well as medications administered while in observation.  Recent changes in the last 24 hours include no acute events overnight.    Plan  Current plan is for SW dispo. Patient is not under full IVC at this time.   Janet Mew, MD 03/24/23 2120

## 2023-03-24 NOTE — ED Notes (Addendum)
Pt sleeping with no signs of acute distress.

## 2023-03-25 DIAGNOSIS — N390 Urinary tract infection, site not specified: Secondary | ICD-10-CM | POA: Diagnosis not present

## 2023-03-25 DIAGNOSIS — E041 Nontoxic single thyroid nodule: Secondary | ICD-10-CM | POA: Diagnosis not present

## 2023-03-25 NOTE — ED Notes (Signed)
Patient husband was at bed side around 17:30pm. Patient ate dinner meal tray. Patient is currently resting and is comfortable at this moment.

## 2023-03-25 NOTE — NC FL2 (Signed)
  [place] MEDICAID FL2 LEVEL OF CARE FORM     IDENTIFICATION  Patient Name: Janet Murphy Birthdate: April 17, 1956 Sex: female Admission Date (Current Location): 02/08/2023  [facility] and [place] Number:  Chiropodist and Address:  [facility], [address] [place], [place] [address]      Provider Number: [id number]  Attending Physician Name and Address:  No att. providers found  Relative Name and Phone Number:  [facility] Spouse   [phone]  [facility] Legal Guardian  [phone]    Current Level of Care: Hospital Recommended Level of Care: Assisted Living Facility, Memory Care Prior Approval Number:    Date Approved/Denied:   PASRR Number:    Discharge Plan: Domiciliary (Rest home) (Memory Care ALF)    Current Diagnoses: Patient Active Problem List   Diagnosis Date Noted   Dementia with behavioral disturbance 12/31/2022   Vitamin D deficiency 06/16/2017   B12 deficiency 06/16/2017   Underweight 06/15/2017   DDD (degenerative disc disease), thoracic 03/22/2014    Orientation RESPIRATION BLADDER Height & Weight     Self  Normal Continent Weight:   Height:     BEHAVIORAL SYMPTOMS/MOOD NEUROLOGICAL BOWEL NUTRITION STATUS     (Dementia) Continent Diet (Regular)  AMBULATORY STATUS COMMUNICATION OF NEEDS Skin   Supervision Verbally Normal                       Personal Care Assistance Level of Assistance  Bathing, Feeding, Dressing Bathing Assistance: Limited assistance Feeding assistance: Independent Dressing Assistance: Limited assistance     Functional Limitations Info  Sight, Hearing, Speech Sight Info: Adequate Hearing Info: Adequate Speech Info: Adequate    SPECIAL CARE FACTORS FREQUENCY                       Contractures Contractures Info: Not present    Additional Factors Info  Code Status, Allergies Code Status Info: Full code Allergies Info: NKDA Psychotropic Info: OLANZapine  (ZYPREXA) tablet 2.5 mg         Current Medications (03/25/2023):  This is the current hospital active medication list Current Facility-Administered Medications  Medication Dose Route Frequency Provider Last Rate Last Admin   LORazepam (ATIVAN) tablet 1 mg  1 mg Oral Q6H PRN Dionne Bucy, MD   1 mg at 03/24/23 1156   melatonin tablet 2.5 mg  2.5 mg Oral QHS Irean Hong, MD   2.5 mg at 03/24/23 2135   OLANZapine (ZYPREXA) tablet 2.5 mg  2.5 mg Oral BID Gabriel Cirri F, NP   2.5 mg at 03/25/23 0953   OLANZapine zydis (ZYPREXA) disintegrating tablet 2.5 mg  2.5 mg Oral BID PRN Vanetta Mulders, NP   2.5 mg at 03/23/23 1420   Current Outpatient Medications  Medication Sig Dispense Refill   OLANZapine (ZYPREXA) 2.5 MG tablet Take 2.5 mg by mouth 2 (two) times daily.       Discharge Medications: Please see discharge summary for a list of discharge medications.  Relevant Imaging Results:  Relevant Lab Results:   Additional Information SS#: [id number]  Margarito Liner, LCSW

## 2023-03-25 NOTE — ED Notes (Signed)
Pt urinated and had a BM in bed at this time. This NT and NT Faith were able to guide pt to the shower room. Pt was showered fully, peri care preformed, hair washed. Pt dryed off and new brief and scrubs put on. Pt bed wiped down with Clorox wipes and left the appropriate amount of time to dry. New linens placed.

## 2023-03-25 NOTE — ED Notes (Signed)
Pt resting comfortably at this time.

## 2023-03-25 NOTE — ED Notes (Signed)
Pt pulled off brief and urinated in bed. Peri care provided, linen and brief changed.

## 2023-03-25 NOTE — ED Notes (Signed)
Peri care performed and pts brief changed.  

## 2023-03-25 NOTE — TOC Progression Note (Signed)
Transition of Care [facility]) - Progression Note    Patient Details  Name: Janet Murphy MRN: [id number] Date of Birth: 1956/12/19  [facility]) CM/SW Contact  Margarito Liner, LCSW Phone Number: 03/25/2023, 12:10 PM  Clinical Narrative:  DSS has not been able to find a facility yet that is willing to accept her with her continued behaviors.   Expected Discharge Plan: Memory Care Barriers to Discharge: Continued Medical Work up  Expected Discharge Plan and Services   Discharge Planning Services: CM Consult   Living arrangements for the past 2 months: Single Family Home                 DME Arranged: N/A DME Agency: NA                   Social Determinants of Health (SDOH) Interventions SDOH Screenings   Tobacco Use: High Risk (02/08/2023)    Readmission Risk Interventions     No data to display

## 2023-03-25 NOTE — ED Notes (Signed)
Pt given a pack of graham crackers and a cup of water at this time.

## 2023-03-25 NOTE — ED Notes (Signed)
Pt offered graham crackers with peanut butter with some juice and water. Pt consumed 100% of graham crackers and 100% of grape juice provided.

## 2023-03-25 NOTE — ED Notes (Signed)
Peri care performed and pts brief changed.

## 2023-03-25 NOTE — ED Notes (Signed)
Pt cleaned and brief, gown and linens changed. Pt is increasingly agitated. Pt is easily redirectable. Safety sitter remains at bedside. Will give PRN meds for agitation

## 2023-03-25 NOTE — ED Notes (Addendum)
Pt urinated in brief. Pt stating "I peed in the bed". Pt cleaned and a clean brief placed on pt. Bed linen also changed at this time.

## 2023-03-25 NOTE — ED Notes (Signed)
Pt urinated in brief. Pt cleaned and a clean brief placed on pt. 

## 2023-03-25 NOTE — ED Notes (Addendum)
Patient is asleep, resting comfortably

## 2023-03-25 NOTE — ED Provider Notes (Signed)
-----------------------------------------   8:43 AM on 03/25/2023 -----------------------------------------   Blood pressure 107/69, pulse 87, temperature 98.1 F (36.7 C), temperature source Oral, resp. rate 17, SpO2 96 %.  The patient is calm and cooperative at this time.  There have been no acute events since the last update.  Awaiting disposition plan from [facility] team.   Loleta Rose, MD 03/25/23 [phone]

## 2023-03-25 NOTE — ED Notes (Signed)
Pt brief changed.  

## 2023-03-25 NOTE — ED Notes (Signed)
Pt awake at this time. Breakfast offered. Pt only drank 100% of grape juice and stated she did not want the food. Pt is now stating that she does not feel good but cannot express what is wrong.

## 2023-03-25 NOTE — ED Notes (Signed)
Graham crackers and juice provided. Pt consumed 100% of graham crackers and juice.

## 2023-03-25 NOTE — ED Notes (Addendum)
Lunch tray provided. Pt stated "ew, dont want that. Throw it out" when given lunch. Pt ate 25% of fruit on tray and 100% of beverage provided.

## 2023-03-26 DIAGNOSIS — E041 Nontoxic single thyroid nodule: Secondary | ICD-10-CM | POA: Diagnosis not present

## 2023-03-26 DIAGNOSIS — N390 Urinary tract infection, site not specified: Secondary | ICD-10-CM | POA: Diagnosis not present

## 2023-03-26 NOTE — ED Notes (Deleted)
Pt repeatly

## 2023-03-26 NOTE — ED Notes (Signed)
vol/toc placement.. 

## 2023-03-26 NOTE — ED Notes (Signed)
Pt had refused dinner tray, previous tech tried encouraging eating but was not successful.

## 2023-03-26 NOTE — ED Notes (Signed)
Pt removed brief and threw it in the floor. A new brief was placed on the pt at this time. Pt had not had a BM or voided at this time.

## 2023-03-26 NOTE — ED Notes (Signed)
Pt continually taking pants and brief off

## 2023-03-26 NOTE — ED Notes (Signed)
Pt had taken BM out of brief and threw it on the wall. Pt and wall was cleaned up and brief, pants blanket, sheets were replaced.

## 2023-03-26 NOTE — ED Notes (Incomplete)
Pt repeated

## 2023-03-26 NOTE — ED Provider Notes (Signed)
-----------------------------------------   5:38 AM on 03/26/2023 -----------------------------------------   Blood pressure 122/70, pulse 80, temperature 98.5 F (36.9 C), temperature source Oral, resp. rate 16, SpO2 100 %.  The patient is calm and cooperative at this time.  There have been no acute events since the last update.  Awaiting disposition plan from Social Work team.   Irean Hong, MD 03/26/23 [phone]

## 2023-03-26 NOTE — ED Notes (Signed)
Pt has soiled bed, bed sheet was changed and new brief was placed

## 2023-03-27 DIAGNOSIS — N390 Urinary tract infection, site not specified: Secondary | ICD-10-CM | POA: Diagnosis not present

## 2023-03-27 DIAGNOSIS — E041 Nontoxic single thyroid nodule: Secondary | ICD-10-CM | POA: Diagnosis not present

## 2023-03-27 NOTE — ED Notes (Signed)
This pt woke up at 05:48 and asked this EDT "ma'am what is today's date" this tech told pt that it was April 7th and it was 05:48 in the morning. Pt then asked "is today Thursday?" This tech told pt that today was Sunday. Pt resting in bed at this time, dry and warm - Bed rails x2

## 2023-03-27 NOTE — ED Notes (Signed)
Pt pooped and pee'd in her brief. Nt performed pericare. Pt linen and brief were changed. Pt resting showing no signs of acute distress.

## 2023-03-27 NOTE — ED Notes (Signed)
Nt gave pt grahram crackers. Pt consumed entire pack.

## 2023-03-27 NOTE — ED Notes (Signed)
Patient was recently changed along with all her lien. Patient has been resting and also, her husband came around 5:17pm today.

## 2023-03-27 NOTE — ED Notes (Signed)
Brief changed and peri care performed. Graham crackers and beverage provided.

## 2023-03-27 NOTE — ED Notes (Signed)
Pt sts, "I peed in the bed." Nt changed pt. Pt is clean and dry.

## 2023-03-27 NOTE — ED Notes (Signed)
Pt took dry brief off. Nt told pt not to remove brief. Nt provided pt with a new brief.

## 2023-03-27 NOTE — ED Notes (Signed)
Lunch tray provided. Pt only consumed 25% of fruit on tray and 100% of beverage.

## 2023-03-27 NOTE — ED Provider Notes (Signed)
    03/26/2023    1:18 PM 03/25/2023   11:57 PM 03/24/2023    8:35 PM  Vitals with BMI  Systolic 140 122 150  Diastolic 66 70 69  Pulse 75 80 87    Patient resting for the majority of my shift. There have been no acute events in the last 24 hours. Patient is pending SW placement.    Georga Hacking, MD 03/27/23 0630

## 2023-03-27 NOTE — ED Notes (Signed)
Patient received dinner tray at this time. Patient didn't eat much, she said "she is resting."

## 2023-03-27 NOTE — ED Notes (Signed)
Patient has breakfast. Ate 75%.

## 2023-03-28 DIAGNOSIS — E041 Nontoxic single thyroid nodule: Secondary | ICD-10-CM | POA: Diagnosis not present

## 2023-03-28 DIAGNOSIS — N390 Urinary tract infection, site not specified: Secondary | ICD-10-CM | POA: Diagnosis not present

## 2023-03-28 NOTE — ED Notes (Signed)
Pt tried taking off her brief, so this NT checked to see if pt needed to be changed. Pt had BM, pt urinated in bed during peri care saying "I peed in the bed", this NT completed peri care, changed pt bed linen, gown, and brief.

## 2023-03-28 NOTE — ED Notes (Signed)
Pt stating she has urinated on self, this tech changed brief. No other needs voiced.  Pt starting to take off brief and gown. This tech attempting to put it on- this is continuous.

## 2023-03-28 NOTE — ED Notes (Addendum)
Pt stated "I peed in the bed". Pt urinated and had a BM at this time.Pt cleaned and a clean brief placed on pt at this time. Bed linens also changed at this time.

## 2023-03-28 NOTE — ED Notes (Signed)
Pt had a BM in Brief. Pt cleaned and a clean brief and a clean gown placed on pt. Bed linen also changed at this time and pt provided with warm blankets.

## 2023-03-28 NOTE — ED Notes (Signed)
Pt assisted with eating hospital provided dinner tray. Pt ate approximately 75% of meal.

## 2023-03-28 NOTE — ED Notes (Signed)
Pt continues to sleep at this time.

## 2023-03-28 NOTE — ED Provider Notes (Signed)
-----------------------------------------   5:04 AM on 03/28/2023 -----------------------------------------   Blood pressure 124/81, pulse 71, temperature 97.9 F (36.6 C), temperature source Axillary, resp. rate 18, SpO2 97 %.  The patient is calm and cooperative at this time.  There have been no acute events since the last update.  Awaiting disposition plan from Social Work team.   Irean Hong, MD 03/28/23 [phone]

## 2023-03-28 NOTE — ED Notes (Addendum)
Patient is sleeping at this time.

## 2023-03-28 NOTE — ED Notes (Signed)
Pt's husband at beside. Pt eating snacks that husband brought at this time.

## 2023-03-28 NOTE — TOC Progression Note (Signed)
Transition of Care [facility]) - Progression Note    Patient Details  Name: Janet Murphy MRN: [id number] Date of Birth: 15-Jul-1956  Transition of Care [facility]) CM/SW Contact  Darleene Cleaver, [place] Phone Number: 03/28/2023, 5:23 PM  Clinical Narrative:     CSW spoke to [facility] DSS supervisor Sharlyne Cai, and they would like an updated FL2, H and P and psych notes emailed to her.  They are trying to see if [facility] will consider patient.  TOC emailed requested information to them.  TOC to continue to follow patient's progress throughout discharge planning.    Expected Discharge Plan: Memory Care Barriers to Discharge: Continued Medical Work up  Expected Discharge Plan and Services   Discharge Planning Services: CM Consult   Living arrangements for the past 2 months: Single Family Home                 DME Arranged: N/A DME Agency: NA                   Social Determinants of Health (SDOH) Interventions SDOH Screenings   Tobacco Use: High Risk (02/08/2023)    Readmission Risk Interventions     No data to display

## 2023-03-28 NOTE — ED Notes (Signed)
Patient received shower. Bed linens have been changed. Patient has clean gown and a clean brief. Patient is now resting comfortably in bed.

## 2023-03-28 NOTE — ED Notes (Signed)
Pt noted to have taken brief off. Brief placed on pt.

## 2023-03-28 NOTE — ED Notes (Signed)
Patient is resting comfortably. 

## 2023-03-28 NOTE — ED Notes (Signed)
Pt urinated in brief. Pt cleaned and a clean brief placed on pt. Bed linens also changed at this time. 

## 2023-03-28 NOTE — ED Notes (Signed)
This tech as 1:1 sitter. Pt currently sleeping.

## 2023-03-28 NOTE — ED Notes (Signed)
This tech as 1:1 sitter for pt. Pt is resting with eyes closed and equal RR.

## 2023-03-28 NOTE — ED Notes (Signed)
Pt given shower, linens changed with MariaNT

## 2023-03-28 NOTE — ED Notes (Signed)
Pt consistently taking brief off

## 2023-03-28 NOTE — ED Notes (Signed)
Pt woke up stating "I peed in the bed" "change me". Peri care provided and new brief placed on pt.

## 2023-03-28 NOTE — ED Notes (Signed)
Pt given breakfast at this time and drink.

## 2023-03-29 NOTE — ED Notes (Signed)
Patient is sleeping at this time.

## 2023-03-29 NOTE — ED Notes (Signed)
Pt continues to rest. Equal and unlabored RR noted

## 2023-03-29 NOTE — ED Notes (Signed)
Patient is still sleeping at this time. 

## 2023-03-29 NOTE — ED Notes (Signed)
Assumed care from Rachel,RN. Pt resting comfortably in bed at this time. Safety sitter at bedside.  

## 2023-03-29 NOTE — ED Notes (Signed)
Pt is refusing to keep clothes on. This EDT and RN asked pt to keep their clothes on. Pt took off the clothes that the RN went to get. Pt took them of and watched themselves urinate on the bed. Pt then stated to say " oh I peed in the bed". Pt then ripped the scrub top of the shirt that was provided for them. Pt refuses to stay out of bed long enough for EDT to change the bed sheets. Pt is currently completely naked (4:05pm). Pt urinated in the bed for a 2nd time, EDT was able to get the soiled sheets from behind the pt. Pt is currently laying down with their shirt ripped open like a jacket.

## 2023-03-29 NOTE — ED Notes (Signed)
Pts brief changed  

## 2023-03-29 NOTE — ED Notes (Signed)
This NT and Akia NT changed patients bedding and cleaned the patient up. Patient is now resting in bed.

## 2023-03-29 NOTE — ED Notes (Signed)
Pt soiled the bed numerous times because pt refuses to keep on clothes or brief. And when pt has either of the item on they take them off. Pt's bed was wiped down with disinfectant wipes and fresh linens on the bed. Pt currently has on two hospital gowns and is laying in bed with EDT at bedside.

## 2023-03-29 NOTE — ED Notes (Signed)
Cleaned patient up and changed bedding. Patient is awake, but resting in bed.

## 2023-03-29 NOTE — ED Notes (Signed)
This

## 2023-03-29 NOTE — ED Notes (Signed)
Pt only took 4-5 bites from corn and mash potatoes. Pt drank her tea and ate cookies.

## 2023-03-29 NOTE — ED Notes (Signed)
Pt continues to rest. RR equal and unlabored. Pt began to mumble incoherently in sleep for a few seconds, but has now stopped.

## 2023-03-30 DIAGNOSIS — E041 Nontoxic single thyroid nodule: Secondary | ICD-10-CM | POA: Diagnosis not present

## 2023-03-30 DIAGNOSIS — N390 Urinary tract infection, site not specified: Secondary | ICD-10-CM | POA: Diagnosis not present

## 2023-03-30 LAB — URINALYSIS, ROUTINE W REFLEX MICROSCOPIC
Bilirubin Urine: NEGATIVE
Glucose, UA: NEGATIVE mg/dL
Hgb urine dipstick: NEGATIVE
Ketones, ur: NEGATIVE mg/dL
Leukocytes,Ua: NEGATIVE
Nitrite: NEGATIVE
Protein, ur: NEGATIVE mg/dL
Specific Gravity, Urine: 1.005 (ref 1.005–1.030)
pH: 6 (ref 5.0–8.0)

## 2023-03-30 LAB — CBC WITH DIFFERENTIAL/PLATELET
Abs Immature Granulocytes: 0.03 10*3/uL (ref 0.00–0.07)
Basophils Absolute: 0.1 10*3/uL (ref 0.0–0.1)
Basophils Relative: 1 %
Eosinophils Absolute: 0.2 10*3/uL (ref 0.0–0.5)
Eosinophils Relative: 2 %
HCT: 42.6 % (ref 36.0–46.0)
Hemoglobin: 14.2 g/dL (ref 12.0–15.0)
Immature Granulocytes: 0 %
Lymphocytes Relative: 40 %
Lymphs Abs: 3.7 10*3/uL (ref 0.7–4.0)
MCH: 31.2 pg (ref 26.0–34.0)
MCHC: 33.3 g/dL (ref 30.0–36.0)
MCV: 93.6 fL (ref 80.0–100.0)
Monocytes Absolute: 0.7 10*3/uL (ref 0.1–1.0)
Monocytes Relative: 7 %
Neutro Abs: 4.5 10*3/uL (ref 1.7–7.7)
Neutrophils Relative %: 50 %
Platelets: 340 10*3/uL (ref 150–400)
RBC: 4.55 MIL/uL (ref 3.87–5.11)
RDW: 11.8 % (ref 11.5–15.5)
WBC: 9.1 10*3/uL (ref 4.0–10.5)
nRBC: 0 % (ref 0.0–0.2)

## 2023-03-30 LAB — COMPREHENSIVE METABOLIC PANEL
ALT: 9 U/L (ref 0–44)
AST: 19 U/L (ref 15–41)
Albumin: 3.7 g/dL (ref 3.5–5.0)
Alkaline Phosphatase: 83 U/L (ref 38–126)
Anion gap: 8 (ref 5–15)
BUN: 14 mg/dL (ref 8–23)
CO2: 24 mmol/L (ref 22–32)
Calcium: 9 mg/dL (ref 8.9–10.3)
Chloride: 106 mmol/L (ref 98–111)
Creatinine, Ser: 0.72 mg/dL (ref 0.44–1.00)
GFR, Estimated: >60 mL/min (ref >60)
Glucose, Bld: 97 mg/dL (ref 70–99)
Potassium: 4.1 mmol/L (ref 3.5–5.1)
Sodium: 138 mmol/L (ref 135–145)
Total Bilirubin: 0.7 mg/dL (ref 0.3–1.2)
Total Protein: 7.3 g/dL (ref 6.5–8.1)

## 2023-03-30 NOTE — ED Provider Notes (Signed)
-----------------------------------------   5:06 AM on 03/30/2023 ----------------------------------------- Today's Vitals   03/26/23 0435 03/26/23 1318 03/27/23 1840 03/28/23 1804  BP:  (!) 140/66 124/81 (!) 172/82  Pulse:  75 71 (!) 109  Resp:  18 18 19   Temp:  98.5 F (36.9 C) 97.9 F (36.6 C) 98 F (36.7 C)  TempSrc:  Oral Axillary Oral  SpO2:  98% 97% 99%  PainSc: Asleep      There is no height or weight on file to calculate BMI.  Patient sleeping comfortably at this time with no complaints or acute events, awaiting disposition plan from social work.  We will recheck labs given it has been about 2 months since patient last had labs checked.   Chesley Noon, MD 03/30/23 [phone]

## 2023-03-30 NOTE — ED Notes (Signed)
Pt eating breakfast and taking pants off

## 2023-03-30 NOTE — ED Provider Notes (Signed)
Emergency Medicine Observation Re-evaluation Note  Janet Murphy is a 67 y.o. female, boarding in the emergency department.  No acute events since last update.  Physical Exam  BP (!) 172/82 (BP Location: Left Arm)   Pulse (!) 109   Temp 98.7 F (37.1 C) (Axillary) Comment: pt refusing oral temp  Resp 19   SpO2 99%   ED Course / MDM   Patient did have lab work performed earlier today showing a reassuring CBC, normal urinalysis, normal chemistry.  Plan  Current plan is for placement to an appropriate living facility once available.    Minna Antis, MD 03/30/23 [phone]

## 2023-03-30 NOTE — ED Notes (Signed)
Pt resting at this time. Will administer am med when awake.

## 2023-03-30 NOTE — ED Notes (Signed)
Pt assisted to toilet with Sydni NT and this RN. BM x1, void x1

## 2023-03-30 NOTE — ED Notes (Signed)
Meal tray provided to pt. Pt ate 50 percent of food, finished drink. Pt stated, "goodnight" and laid down in bed.

## 2023-03-30 NOTE — ED Notes (Signed)
TOC

## 2023-03-30 NOTE — ED Notes (Signed)
Labs collected and urine sample collected, pt given snack and linens changed. Sitter at bedside.

## 2023-03-30 NOTE — ED Notes (Signed)
This NT walked with patient to restroom. Patient urinated in toilet and was returned to bed.

## 2023-03-30 NOTE — ED Notes (Signed)
Pt refusing full set of vitals

## 2023-03-30 NOTE — ED Notes (Signed)
Pts brief changed  

## 2023-03-30 NOTE — ED Notes (Signed)
Patient ate 100% of breakfast tray. Waste discarded appropriately.

## 2023-03-31 NOTE — ED Notes (Signed)
Pt dry and cleaned. Pt ate food that was left since 1530 warmed up by nurse Gerilyn Pilgrim and fed by this EDT. Pt spit out rice and peas and threw it onto tray, but ate 100% of chicken. Pt resting in bed at this time saying "goodnight, pipe down on all that noise"

## 2023-03-31 NOTE — ED Notes (Signed)
Chart reviewed and noted no set of VS completed since 4/8 1800. This nurse assisted by Keane Scrape completed a full set of VS. Pt directable confused and difficult to maintain compliance with direction. Linens dry and brief dry. Will continue to monitor.

## 2023-03-31 NOTE — ED Notes (Signed)
Patient refused to have vitals checked, stated "leme alone" to tech

## 2023-03-31 NOTE — ED Notes (Signed)
This NT attempted to wake pt up to get pt vital signs, pt said "leave me alone" and went back to sleep.

## 2023-03-31 NOTE — ED Notes (Signed)
Pt incontinent of urine at this time. Soiled brief removed by pt, pt cleaned with peri wipes and a clean brief was placed on pt at this time.

## 2023-03-31 NOTE — ED Notes (Signed)
Pt incontinent of stool. Pt removed brief and grabbed feces while this tech was getting supplies to clean pt. Pt was cleaned two person assist. Clean brief and warm blankets placed on pt at this time.

## 2023-03-31 NOTE — ED Notes (Signed)
Patient dried at this time. 

## 2023-03-31 NOTE — ED Notes (Signed)
Pt husband visited. Pt calm and pleasant during time with her husband. Pt resting comfortably at this time

## 2023-04-01 DIAGNOSIS — N390 Urinary tract infection, site not specified: Secondary | ICD-10-CM | POA: Diagnosis not present

## 2023-04-01 DIAGNOSIS — E041 Nontoxic single thyroid nodule: Secondary | ICD-10-CM | POA: Diagnosis not present

## 2023-04-01 NOTE — ED Notes (Signed)
Pt had BM in brief. Pt cleaned and a clean brief placed on pt.

## 2023-04-01 NOTE — ED Notes (Addendum)
Pt continuously taking brief and pants off. EDT attempted to redirect pt. Pt allowed EDT to replace brief and pants. Pt provided with fresh warm blankets

## 2023-04-01 NOTE — ED Notes (Signed)
Pt continuously pulling pants down and taking brief off. This writer placed brief back on pt and pt redirected to pull pants back up. Sitter is now sitting within arms reach of pt to prevent the pt from repeating this.

## 2023-04-01 NOTE — ED Provider Notes (Signed)
-----------------------------------------   5:15 AM on 04/01/2023 -----------------------------------------   Blood pressure (!) 149/88, pulse 82, temperature 98.4 F (36.9 C), temperature source Oral, resp. rate 20, SpO2 98 %.  The patient is calm and cooperative at this time.  There have been no acute events since the last update.  Awaiting disposition plan from case management/social work.    Yogesh Cominsky, Layla Maw, DO 04/01/23 [phone]

## 2023-04-01 NOTE — ED Notes (Signed)
Pt lying in bed repeatedly stating "goodnight", along with "silence would be nice".

## 2023-04-01 NOTE — ED Notes (Signed)
Pt gotten out of bed, cleaned and given new scrub top and brief. While pt was standing, bed was cleaned and sheets were changed. Pt back in bed and restless. Pt urinated again in new brief and attempted to get out of bed. Pt changed and cleaned for second time. Pt still restless in bed. Will continue to monitor at bedside.

## 2023-04-01 NOTE — ED Notes (Signed)
Dinner tray provided. Pt consumed 50% of tray.

## 2023-04-01 NOTE — ED Notes (Signed)
Pt had soiled the bed. So this EDT and [place] EDT have pt a full shower. Pt did cooperate during the shower. EDT changed pt's linen and gave pt warm blankets.

## 2023-04-01 NOTE — ED Notes (Signed)
Attempted to get vital signs at this time. Pt refused pulse ox and took BP cuff off of arm. Temperature taken and charted.

## 2023-04-01 NOTE — ED Notes (Signed)
Pt urinated in brief. Pt cleaned and a clean brief and clean scrub pants placed on pt. Bed linens changed and warm blankets provided to pt.

## 2023-04-02 NOTE — ED Notes (Signed)
Patient resting comfortably in bed at this time. Respirations even and unlabored with no signs of distress. Sitter at bedside.

## 2023-04-02 NOTE — ED Notes (Signed)
Pt ate 50% food, changed and back in bed

## 2023-04-02 NOTE — ED Notes (Signed)
This Clinical research associate asked pt if she has any needs at this time, pt stated I'm just resting".

## 2023-04-02 NOTE — ED Notes (Signed)
Just changed pt bed linen and put on new brief. Pt now resting in bed

## 2023-04-02 NOTE — ED Notes (Signed)
Pt restless and continuously taking off pants and brief. Pt cleaned and given new brief four times. Pt resting now and stating she is tired. Will continue to monitor at bedside.

## 2023-04-02 NOTE — ED Notes (Signed)
Pt continuously taking brief and pants off. Pt not redirectable at this time. RN notified.

## 2023-04-02 NOTE — ED Notes (Addendum)
Pt had BM in brief. Pt dug in brief and threw stool on the wall. Pt cleaned and a clean brief placed on pt at this time. Wall cleaned and pt provided with clean blankets.

## 2023-04-02 NOTE — ED Notes (Signed)
TOC

## 2023-04-02 NOTE — ED Notes (Signed)
Pt pulled pants down and took brief off at this time and pulled pants back up. Pt covered self back up with blanket.

## 2023-04-02 NOTE — ED Notes (Signed)
Handoff to this tech @11 . Pt currently in bed with eyes open and appears to be resting. Will continue to monitor.

## 2023-04-02 NOTE — ED Notes (Signed)
Attempted vitals at this time. Unable to get them. Pt would not keep Bp and pulse ox on.

## 2023-04-02 NOTE — ED Notes (Signed)
Pt urinated in brief. Pt cleaned and a clean brief placed on pt. Bed linens also changed and pt provided with blankets at this time.

## 2023-04-03 DIAGNOSIS — E041 Nontoxic single thyroid nodule: Secondary | ICD-10-CM | POA: Diagnosis not present

## 2023-04-03 DIAGNOSIS — N390 Urinary tract infection, site not specified: Secondary | ICD-10-CM | POA: Diagnosis not present

## 2023-04-03 NOTE — ED Notes (Signed)
Pt given meds with apple sauce. Pt ate 100% of container

## 2023-04-03 NOTE — ED Notes (Signed)
This Clinical research associate is usng the computer to play "white noise" so that it will help patient to relax and be able to sleep some. It helps with the patient not having to hear all the sounds that are part of being out in a hall bed. No other people or patients are affected by the white noise coming from the computer. Pt is resting peacefully.

## 2023-04-03 NOTE — ED Notes (Signed)
Pt is sleeping. Rise and fall of chest noted. Continuing to sit one on one with pt.

## 2023-04-03 NOTE — ED Notes (Signed)
Pt had urinated on self. Pt cleaned and placed into new underwear and pants. bed sheets, blankets, and bed pad were all replaced.

## 2023-04-03 NOTE — ED Notes (Signed)
Pt smelled as if she had made a bowl movement. Small bowl movement was noted in her pant. Brief, pants, and sheets were changed.

## 2023-04-03 NOTE — ED Provider Notes (Signed)
-----------------------------------------   7:15 AM on 04/03/2023 -----------------------------------------   Blood pressure 130/85, pulse 65, temperature 98 F (36.7 C), temperature source Axillary, resp. rate 16, SpO2 98 %.  The patient is calm and cooperative at this time.  There have been no acute events since the last update.  Awaiting disposition plan from case management/social work.    Thadeus Gandolfi, Layla Maw, DO 04/03/23 [phone]

## 2023-04-03 NOTE — ED Notes (Signed)
Breakfast tray was delivered. This Clinical research associate assisted the patient with eating. Pt ate about 40%. Pt is calm with no needs at this time.

## 2023-04-03 NOTE — ED Notes (Signed)
Writer played bible stories for pt, pt had calmed down and is more relaxed

## 2023-04-03 NOTE — ED Notes (Signed)
NT attempted to do vital signs with this RN assistance. Pt is restless and noncompliant for accurate VS at this time.

## 2023-04-03 NOTE — ED Notes (Signed)
Pt took of brief, brief was full of urine and poop. Writer cleaned pt up and placed new brief on pt.

## 2023-04-03 NOTE — ED Notes (Signed)
Safety sitter Selena Batten currently sitting with pt.

## 2023-04-03 NOTE — ED Provider Notes (Signed)
Emergency Medicine Observation Re-evaluation Note  Janet Murphy is a 67 y.o. female, currently boarding in the Emergency Department.    Physical Exam  BP 130/85 (BP Location: Left Arm)   Pulse 65   Temp 98 F (36.7 C) (Axillary)   Resp 16   SpO2 98%    ED Course / MDM   Lab work from 4/10 reassuring with no significant findings.  Plan  Current plan is for him into the proper living facility when available.    Minna Antis, MD 04/03/23 [phone]

## 2023-04-03 NOTE — ED Notes (Addendum)
Unable to obtain VS at this time. Pt became aggressive and kept trying to take off BP cuff when obtaining VS

## 2023-04-03 NOTE — ED Notes (Signed)
Pt had a snack of apple sauce after RN gave medications.

## 2023-04-03 NOTE — ED Notes (Signed)
Pt keeps attempting to pull down pants and brief.

## 2023-04-04 DIAGNOSIS — N390 Urinary tract infection, site not specified: Secondary | ICD-10-CM | POA: Diagnosis not present

## 2023-04-04 DIAGNOSIS — E041 Nontoxic single thyroid nodule: Secondary | ICD-10-CM | POA: Diagnosis not present

## 2023-04-04 NOTE — ED Notes (Signed)
Pt ate entire container of apple sauce with night time medicine.

## 2023-04-04 NOTE — ED Notes (Signed)
Vitals to be taken by [facility] NT sitter once pt wakes up.

## 2023-04-04 NOTE — ED Notes (Signed)
Patient got a shower. Clean linens and a clean brief. Patient is resting comfortably.

## 2023-04-04 NOTE — ED Notes (Signed)
This NT fed patient breakfast and took patients vital signs. Patient ate 50% of tray. Patient now resting in bed and handoff given to [facility].

## 2023-04-04 NOTE — ED Notes (Signed)
Patient husband came to visit. Patient also, didn't eat much oh her dinner.

## 2023-04-04 NOTE — TOC Progression Note (Signed)
Transition of Care [facility]) - Progression Note    Patient Details  Name: Janet Murphy MRN: [id number] Date of Birth: 1956/08/15  [facility]) CM/SW Contact  Darleene Cleaver, [place] Phone Number: 04/04/2023, 6:54 PM  Clinical Narrative:     Per [facility], they are still waiting to hear back from [facility].  TOC to continue to follow patient's progress throughout discharge planning.  Expected Discharge Plan: Memory Care Barriers to Discharge: Continued Medical Work up  Expected Discharge Plan and Services   Discharge Planning Services: CM Consult   Living arrangements for the past 2 months: Single Family Home                 DME Arranged: N/A DME Agency: NA                   Social Determinants of Health (SDOH) Interventions SDOH Screenings   Tobacco Use: High Risk (02/08/2023)    Readmission Risk Interventions     No data to display

## 2023-04-04 NOTE — ED Notes (Signed)
Pt repeatedly trying to take pants and brief off

## 2023-04-04 NOTE — ED Notes (Signed)
Pt continues to be agitated. Pt says sentences repeatedly and is starting to yell them out.

## 2023-04-04 NOTE — ED Notes (Signed)
This RN notified by NT that pt is awake. This RN in another pt's room currently. Will provide scheduled med once able.

## 2023-04-04 NOTE — ED Provider Notes (Signed)
-----------------------------------------   7:15 AM on 04/04/2023 -----------------------------------------   Blood pressure 130/85, pulse 65, temperature 98 F (36.7 C), temperature source Axillary, resp. rate 16, SpO2 98 %.  The patient is calm and cooperative at this time.  There have been no acute events since the last update.  Awaiting disposition plan from [facility] team.   Loleta Rose, MD 04/04/23 [phone]

## 2023-04-04 NOTE — ED Notes (Addendum)
Pt currently in shower with Byrd Hesselbach NT assisting. Will give med once pt back to bed.

## 2023-04-04 NOTE — ED Notes (Signed)
Pt intermittently turns in sleep; chest rise and fall noted. NT Sitter remains at bedside. Pt's breakfast at bedside for when she wakes.

## 2023-04-04 NOTE — ED Notes (Signed)
VOL/TOC Placement 

## 2023-04-05 DIAGNOSIS — N390 Urinary tract infection, site not specified: Secondary | ICD-10-CM | POA: Diagnosis not present

## 2023-04-05 DIAGNOSIS — E041 Nontoxic single thyroid nodule: Secondary | ICD-10-CM | POA: Diagnosis not present

## 2023-04-05 NOTE — ED Provider Notes (Signed)
Emergency Medicine Observation Re-evaluation Note  Janet Murphy is a 67 y.o. female, seen on rounds today.  Pt initially presented to the ED for complaints of IVC  Currently, the patient is calm, no acute complaints.  Physical Exam  Blood pressure (!) 132/92, pulse 82, temperature (!) 96.9 F (36.1 C), temperature source Axillary, resp. rate 18, SpO2 98 %. Physical Exam General: NAD Lungs: CTAB Psych: not agitated  ED Course / MDM  EKG:    I have reviewed the labs performed to date as well as medications administered while in observation.  Recent changes in the last 24 hours include no acute events overnight.    Plan  Current plan is for  SW dispo.   Sharman Cheek, MD 04/05/23 [phone]

## 2023-04-05 NOTE — ED Notes (Signed)
Tech attempted to get vital signs on patient without success. Pt would not hold still. Will reattempt after giving medication time to take affect.

## 2023-04-05 NOTE — ED Notes (Signed)
This tech changed pts soiled brief at this time.  

## 2023-04-05 NOTE — ED Notes (Signed)
Pt resting in bed. Calm. Sitter at bedside.

## 2023-04-05 NOTE — ED Notes (Signed)
vol/toc placement.. 

## 2023-04-05 NOTE — ED Notes (Signed)
This tech changed pts soiled brief at this time.

## 2023-04-05 NOTE — ED Provider Notes (Signed)
-----------------------------------------   5:58 AM on 04/05/2023 -----------------------------------------   Blood pressure (!) 126/93, pulse 66, temperature (!) 96.9 F (36.1 C), temperature source Axillary, resp. rate 16, SpO2 97 %.  The patient is calm and cooperative at this time.  There have been no acute events since the last update.  Awaiting disposition plan from Social Work team.   Irean Hong, MD 04/05/23 [phone]

## 2023-04-05 NOTE — ED Notes (Signed)
Pt laying in bed with sitter at bedside. RN introduced himself and pt did not respond, just looked at him.

## 2023-04-06 DIAGNOSIS — E041 Nontoxic single thyroid nodule: Secondary | ICD-10-CM | POA: Diagnosis not present

## 2023-04-06 DIAGNOSIS — N390 Urinary tract infection, site not specified: Secondary | ICD-10-CM | POA: Diagnosis not present

## 2023-04-06 NOTE — ED Notes (Addendum)
This EDT attempted to obtain vital signs but was unsuccessful due to pt not being compliant. This EDT was able to obtain temperature.

## 2023-04-06 NOTE — ED Notes (Signed)
Chest rise and fall noted. Patient resting at this time w/ NT sitter at bedside.

## 2023-04-06 NOTE — ED Notes (Signed)
Patient became agitated once trying to obtain vital signs. Trying to constantly remove blood pressure cuff and pulse oximeter. Unable to obtain temperature at this time.

## 2023-04-06 NOTE — ED Notes (Signed)
Patient ate 50% of her dinner at this time.

## 2023-04-06 NOTE — ED Notes (Signed)
Lunch tray provided. Pt consumed 25% of food and 100% of beverage.

## 2023-04-06 NOTE — ED Notes (Signed)
Clean disposable pad and brief placed under pt.

## 2023-04-06 NOTE — ED Provider Notes (Signed)
-----------------------------------------   6:26 AM on 04/06/2023 -----------------------------------------   Blood pressure (!) 132/92, pulse 82, temperature (!) 96.9 F (36.1 C), temperature source Axillary, resp. rate 18, SpO2 98 %.  The patient is calm and cooperative at this time.  There have been no acute events since the last update.  Awaiting disposition plan from Social Work team.   Irean Hong, MD 04/06/23 [phone]

## 2023-04-07 DIAGNOSIS — N390 Urinary tract infection, site not specified: Secondary | ICD-10-CM | POA: Diagnosis not present

## 2023-04-07 DIAGNOSIS — E041 Nontoxic single thyroid nodule: Secondary | ICD-10-CM | POA: Diagnosis not present

## 2023-04-07 NOTE — ED Notes (Signed)
This tech and Theron, RN, changed pt bed sheets. Pt was helped to stand by this tech at bedside so the sheets could be changed. New brief placed on pt as well

## 2023-04-07 NOTE — ED Notes (Addendum)
Pt resting in bed with eyes open looking at the ceiling. Pt then states "where is life in this hellhole" Pt HOB lowered so that pt can lay flat and hopefully be able to get some rest. Pt yawns and states "oh thank you, thank you"  Pt is resting with eyes closed. Pt opens eyes and states "I'm dying I'm dead watch over me tonight"

## 2023-04-07 NOTE — ED Notes (Signed)
Pt had dinner. Pt ate less than 25%

## 2023-04-07 NOTE — ED Notes (Signed)
Pt has taken brief off, this tech asks pt to put it back on, pt states "you put it on me". This tech places new brief on pt, pt immediately takes it on, will not keep a brief on even after numerous attmepts from this tech. Pt throws brief to the end of bed. Repeating "watch over me tonight jesus if you dont mind"

## 2023-04-07 NOTE — ED Notes (Signed)
Pt requesting food. Pt provided a sandwich tray and consumed 25%.

## 2023-04-07 NOTE — ED Notes (Addendum)
Pt was wet. Pt was cleaned up. Pt has on a clean brief.

## 2023-04-07 NOTE — ED Notes (Signed)
This tech attempted to take VS on pt, pt holds arm out for BP cuff but immediately takes the cuff off. Pt will not let this tech place pulse ox on finger, pt states "go away go away go away"

## 2023-04-07 NOTE — ED Notes (Signed)
Pt has taken brief off. Will not keep it on. Throws it to the end of the bed.

## 2023-04-07 NOTE — ED Notes (Signed)
Pt was wet. Pt was cleaned up and a clean brief was put on.

## 2023-04-07 NOTE — TOC Progression Note (Addendum)
Transition of Care [facility]) - Progression Note    Patient Details  Name: Janet Murphy MRN: [id number] Date of Birth: 1956/10/18  Transition of Care [facility]) CM/SW Contact  Darleene Cleaver, [place] Phone Number: 04/07/2023, 10:09 PM  Clinical Narrative:    TOC informed by Rudean Curt from [facility] she may have a bed for patient in a memory care unit.  Updated clinicals sent to SNFs.  She will update TOC as soon as she knows.  TOC to continue to follow patient's progress, DSS is patient's legal guardian.   Expected Discharge Plan: Memory Care Barriers to Discharge: Continued Medical Work up  Expected Discharge Plan and Services   Discharge Planning Services: CM Consult   Living arrangements for the past 2 months: Single Family Home                 DME Arranged: N/A DME Agency: NA                   Social Determinants of Health (SDOH) Interventions SDOH Screenings   Tobacco Use: High Risk (02/08/2023)    Readmission Risk Interventions     No data to display

## 2023-04-07 NOTE — ED Notes (Signed)
Patient resting in bed free from sign of distress. Breathing unlabored  with symmetric chest rise and fall. Bed low and locked with side rails raised x2. Sitter at bedside with pt.

## 2023-04-07 NOTE — ED Notes (Signed)
This NT cleaned patient up after an episode of urinary incontinence. This NT placed a new brief on the patient as well as changed the bed sheet. Patient now resting in bed with eyes closed.

## 2023-04-07 NOTE — ED Notes (Signed)
This NT gave pt a shower. Pt has clean brief and clean shirt. Nurse Jerilee Field gave pt clean linens.Pt is resting comfortably.

## 2023-04-07 NOTE — ED Notes (Signed)
This NT assisted patient with eating breakfast. Patient ate about 75% of tray. Patient now resting in bed.

## 2023-04-07 NOTE — ED Notes (Signed)
Pt consumed 50% of lunch tray.

## 2023-04-07 NOTE — ED Notes (Signed)
This tech and theron, rn, changed pt linen as it had stool on it. Pt also placed in new brief with mesh underwear on top in attempt to help keep brief on. New blankets given as well as they had stool on them. Jerilee Field rn attempted to clean under pt nails to best of ability as pt had stool under her nails. Pt repeats "thank you" and "goodnight" multiple times.

## 2023-04-07 NOTE — ED Notes (Signed)
Pt was wet. Pt was cleaned up. New brief was put on.

## 2023-04-07 NOTE — ED Notes (Signed)
vol/toc placement.. 

## 2023-04-07 NOTE — ED Provider Notes (Signed)
-----------------------------------------   6:33 AM on 04/07/2023 -----------------------------------------   Blood pressure (!) 115/92, pulse 84, temperature 97.7 F (36.5 C), temperature source Oral, resp. rate 18, SpO2 96 %.  The patient is calm and cooperative at this time.  There have been no acute events since the last update.  Awaiting disposition plan from Social Work team.   Irean Hong, MD 04/07/23 [phone]

## 2023-04-08 DIAGNOSIS — N390 Urinary tract infection, site not specified: Secondary | ICD-10-CM | POA: Diagnosis not present

## 2023-04-08 DIAGNOSIS — E041 Nontoxic single thyroid nodule: Secondary | ICD-10-CM | POA: Diagnosis not present

## 2023-04-08 MED ORDER — LORAZEPAM 1 MG PO TABS
1.0000 mg | ORAL_TABLET | Freq: Once | ORAL | Status: AC
  Administered 2023-04-17: 1 mg via ORAL
  Filled 2023-04-08: qty 1

## 2023-04-08 NOTE — ED Notes (Signed)
Staff just gave patient a shower. Patient is now eating her breakfast at this time. Patient did have a bowel movement.

## 2023-04-08 NOTE — ED Notes (Signed)
1:1 sitter at bedside for safety. Pt continues to refuse VS when attempted, begins cursing staff and taking monitors off before VS complete.

## 2023-04-08 NOTE — ED Notes (Signed)
Patient ate 25% of breakfast. Patient social worker did come to see her.

## 2023-04-08 NOTE — ED Notes (Signed)
Staff tried two attempts to get patient blood pressure. Patient blood pressure is 176/153 with staff holding down patient arms. RN was notified.

## 2023-04-08 NOTE — ED Notes (Signed)
Pt woke up from sleeping and proceeded to dig in brief and wipe feces on the sheets and bed rail. This EDT attempted to redirect pt unsuccessfully. Pt brief and linen changed by this EDT and Animator.

## 2023-04-08 NOTE — ED Notes (Signed)
Attempted to take pts vitals. Pt allowed this tech to put bp cuff on arm. Pt then became agitated and ripped the bp cuff off stating "fuck you, leave me alone, leave me alone".

## 2023-04-08 NOTE — ED Notes (Signed)
Lunch tray provided. Pt consumed less than 25% of tray. Pt encouraged to eat, pt repeatedly stated "go away, throw it out"

## 2023-04-08 NOTE — ED Notes (Signed)
VOL/TOC Placement 

## 2023-04-08 NOTE — ED Provider Notes (Signed)
-----------------------------------------   5:46 AM on 04/08/2023 -----------------------------------------   Blood pressure (!) 115/92, pulse 84, temperature 97.7 F (36.5 C), temperature source Oral, resp. rate 18, SpO2 96 %.  The patient is calm and cooperative at this time.  There have been no acute events since the last update.  Awaiting disposition plan from case management/social work.    Dhruva Orndoff, Layla Maw, DO 04/08/23 [phone]

## 2023-04-08 NOTE — ED Notes (Signed)
Pt resting, RR even and unlabored.  Will attempt VS when awake.

## 2023-04-08 NOTE — ED Notes (Signed)
Pt stated "I just shit the bed". While this tech attempted to clean pt, pt pulled stool from brief, threw it on the end of her bed, and began wiping it on bed rails and linen. This tech redirected pt. Pt then allowed this tech to provide peri care, change linen and brief and clean bed rails. Pt now resting in bed with eyes closed.

## 2023-04-09 NOTE — ED Notes (Signed)
Pt assisted with eating hospital provided dinner tray. Pt ate approximately 50% of tray.

## 2023-04-09 NOTE — ED Notes (Signed)
Pt urinated in bed. Pt cleaned and a brief placed on pt. Clean gown also placed on pt and bed linens changed at this time.

## 2023-04-09 NOTE — ED Notes (Signed)
Pt's husband at bedside. Pt eating snack that husband brought.

## 2023-04-09 NOTE — ED Notes (Signed)
TOC

## 2023-04-09 NOTE — ED Notes (Signed)
Pt ate 25% of morning meal at this time. Consumed 100% of grape juice provided.

## 2023-04-09 NOTE — ED Notes (Signed)
Pt urinated in brief. Pt cleaned and a new brief placed on pt.

## 2023-04-09 NOTE — ED Notes (Addendum)
Pt seems to be restless, constantly taking off blanket and repositioning pillow several times.Pt is showing some agitation with the amount of noise going on nearby, pt yells out "silence", "its awfully noisy in this hell hole", and "this is the worst life I've ever had". 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

## 2023-04-09 NOTE — ED Notes (Signed)
Pt took brief off and threw it. Pt had urinated in brief. Pt cleaned and a clean brief placed on pt.

## 2023-04-09 NOTE — ED Notes (Signed)
Pt took off brief and proceeded to dig into anus and smear feces all on her hands, this tech wiped pt hands and handed wipes to pt where she wiped herself. This tech followed up behind pt to clean up rest of poop. New brief applied and pt is warm and dry. Pt constantly repeating out "watch over me tonight jesus"

## 2023-04-09 NOTE — ED Notes (Signed)
Pt had BM at this time. This NT and NT Faith showered pt at this time. New gown and brief applied. NT Megan cleaned bed and applied new linens.

## 2023-04-09 NOTE — ED Notes (Signed)
This NT helped assist PT eat. PT ate about 75% of lunch. Pt resting In bed with no other needs at this time.

## 2023-04-09 NOTE — ED Notes (Signed)
Pt stated "I peed the bed". Peri care provided, new brief placed on pt. Pt is resting comfortably in bed.

## 2023-04-10 DIAGNOSIS — N390 Urinary tract infection, site not specified: Secondary | ICD-10-CM | POA: Diagnosis not present

## 2023-04-10 DIAGNOSIS — E041 Nontoxic single thyroid nodule: Secondary | ICD-10-CM | POA: Diagnosis not present

## 2023-04-10 NOTE — ED Notes (Addendum)
Pt husband is at bedside at this time. Pt husband has brought pt cookies and a pepsi. HOB raised to ease swallowing.

## 2023-04-10 NOTE — ED Notes (Addendum)
Pt becoming increasingly restless. Pt digging in her rectum, Pt hands cleaned and brief changed. Pt unable to be redirected at this time. Pt resting comfortably at this time, repeating "look over me".

## 2023-04-10 NOTE — ED Provider Notes (Signed)
-----------------------------------------   6:26 AM on 04/10/2023 -----------------------------------------   Blood pressure 102/71, pulse (!) 52, temperature (!) 97.5 F (36.4 C), temperature source Axillary, resp. rate 18, SpO2 95 %.  The patient is calm and cooperative at this time.  There have been no acute events since the last update.  Awaiting disposition plan from case management/social work.    Ayinde Swim, Layla Maw, DO 04/10/23 [phone]

## 2023-04-10 NOTE — ED Notes (Signed)
Pt yelling loudly. Verbal de escalation and redirection ineffective methods in getting pt to stop yelling out. Pt yelling "Take me to heaven" and is unwilling to elaborate to what that means.  Pt took brief off after EDT put clean brief back on. Pt then took the brief she had just removed to wipe off poop from her rectum and proceed to dig in her rectum. EDT stopped pt from digging in her rectum and cleaned pts fingers and bottom.   Pt refuses to wear a brief so 2 chux are in place. Pt continues to scream "Take me to heaven." Safety sitter remains at bedside.

## 2023-04-10 NOTE — ED Notes (Addendum)
Pt removing brief at this time, this tech attempting to tell pt to put it back on, pt states "shut up asshole". Pt using brief that she took off to wipe her self, stool visualized on brief. Pt threw brief away In trashcan provided by this tech. Pt currently has chux pads x2 underneath her. Pt stating "dont touch me ever" and "go away you fucking alien"

## 2023-04-10 NOTE — ED Notes (Signed)
Full linen change done by this tech and taylor RN. New gown placed on pt along with new brief and chux pad. New blankets provided as well. Stool on wall and side rails cleaned up. This tech clean stool that was on pt hands and nails to the best of my ability.Pt stating "come here you little alien. Hey god."   Pt was given ice cream with meds by RN. Pt finishes ice cream and throws the ice cream cup towards the end of her bed, with it landing on the ground. Pt constantly repeating 'hey yellow bird"

## 2023-04-10 NOTE — ED Notes (Addendum)
Pt repeatedly saying "throw those white and yellow birds out"  and "white yellow black bird"  Pt also keeps moving her pillow from behind her head to be on her lap, then says "uh oh" and puts it back behind her head

## 2023-04-10 NOTE — ED Notes (Signed)
Attempted to get pt vital signs. Pt does not let this tech put BP cuff on arm, pt takes it off and states  'go away throw it out"

## 2023-04-10 NOTE — ED Notes (Signed)
Pt was given one pack of gram crackers, one cup of apple sauce and a diet ginger ale. All tolerated well. Pt calm with eyes close but not sleeping

## 2023-04-10 NOTE — ED Notes (Addendum)
Pt smearing feces on bed sheet again after digging into her anus. Full linen change done by this tech. New bed sheets, brief, and chux pad provided.

## 2023-04-10 NOTE — ED Notes (Signed)
Pt repeating "go away". "Go away you freaking alien". Pt continues to remove gown and blanket.

## 2023-04-10 NOTE — ED Notes (Signed)
Pt became eradicate stating she feels sick. Pt then took off brief and fling poop onto the wall. Pt and wall were cleaned up. Gown, blankets, sheet, brief and pillow case there all replaced.

## 2023-04-10 NOTE — ED Notes (Signed)
Pt ate about 25% of her dinner tray and 100% of her beverage. pt stating "Throw it out" and "that's all I want" pt ate 4 bites of [place], 3 bites of mashed potatoes. And 3 pieces of pear. Pt HOB was sat up to eat. Pt demanding HOB to be lowered, this tech waited to lower HOB until after pt had swallowed food.   Pt has taken off brief, states "ha ha ha ha ha ha fuck you here you go"

## 2023-04-10 NOTE — ED Notes (Signed)
Pt had stuck her hands near her anus and had poop on her hands. Wrier cleaned pt and brief and underwear were replaced.

## 2023-04-10 NOTE — ED Notes (Addendum)
Pt has defecated on herself. Pt is digging fingers into anus and smearing the feces on the blanket, sheets, the wall, and the side rail.  Awaiting RN to do full linen change.

## 2023-04-10 NOTE — ED Notes (Signed)
Pt removed brief after having a BM and placed hands into it. RN and EMT attempted to educate and redirect pt. Pt not directable at this time.

## 2023-04-10 NOTE — ED Notes (Signed)
Pt urinated in bed, linens changed, new gown, brief and chux applied.

## 2023-04-10 NOTE — ED Notes (Signed)
Pt took off brief. Brief was full of urine. Brief replaced

## 2023-04-10 NOTE — ED Notes (Addendum)
Pt had saturated bed sheets with urine since pt took and threw brief off. This tech and taylor RN changed pt bed linen, new chux pads x2 and new brief placed. Pt also changed into a new gown. Pt states "thank you thank you" pt states "going to bed. My lord its loud in here. My lord goodnight"

## 2023-04-10 NOTE — ED Notes (Signed)
Patient had a shower this morning. Patient ate 50% of her breakfast. Changed lien sheets, pt is currently comfortable at this time.

## 2023-04-10 NOTE — ED Notes (Signed)
Pt removed brief and soiled bed. Linen changed and Pt provided with new gown. Pt ate 3 bites of baked potato. Pt resting comfortably at this time.

## 2023-04-10 NOTE — ED Notes (Signed)
Pt has taken brief off. Repeating "give me pepsi give it to me" , pointing to a styrofoam cup. This tech advises pt there is no pepsi. Pt states "fuck you shut up". Pt has taken brief off. Pt threw her brief towards the end of the bed. Pt is now laying down in bed stating "watch over me tonight"

## 2023-04-10 NOTE — ED Notes (Addendum)
Pt has taken brief off. States "I'm scared" pt attempts to put it back on, but just  sets it aside and states "just change it later". Pt keeps repeating "I'm scared now". Pt helps this tech place brief back  on. Pt asking for food. Given a pack of saltine crackers, HOB of was sat up for pt to eat. Given some water to wash down food. HOB laid back down per pt request.

## 2023-04-11 NOTE — ED Notes (Signed)
toc

## 2023-04-11 NOTE — ED Notes (Signed)
Pt woke up, "I'm wet I'm wet."  This tech performed inc care/peri care X2. Dry brief applied. This tech attempted to get vs. Unable to get b/p. Pt ate two crackers and drank water.

## 2023-04-11 NOTE — ED Notes (Signed)
Pt had a few bites of breakfast and drank a full cup of cranberry juice. Pt went back to sleep.

## 2023-04-11 NOTE — ED Notes (Signed)
Patient received her dinner tray at this time. Patient only ate 3 bites of her food and had something to drink. Patient ate some of her fruit. Patient says " I am sleepy and I am going to bed."

## 2023-04-11 NOTE — ED Notes (Signed)
This tech and Herbert Seta, RN changed pt. Pt urinated on self. Pts linen and brief changed. New brief and chux placed. No other needs voiced at this time.

## 2023-04-11 NOTE — ED Notes (Signed)
Patient brief and linens changed.

## 2023-04-11 NOTE — ED Notes (Signed)
Pt refusing to keep brief on, as well as the blanket and gown. Pt very restless at this time and yelling repeatedly. This tech is sitting at bedside.

## 2023-04-11 NOTE — ED Notes (Signed)
Pts lunch tray given; pt only ate 2 cookies.

## 2023-04-11 NOTE — ED Notes (Signed)
This tech, Caitlyn EDT and [employer] performed inc care. Pt got stood up on the side of the bed. This tech and Restaurant manager, fast food performed inc care. Caitlyn performed linen change. Pt is back in bed.

## 2023-04-11 NOTE — ED Notes (Signed)
Unable to get B/p due to pt kept trying to take off b/p cuff. Attempted mult times.

## 2023-04-12 DIAGNOSIS — N390 Urinary tract infection, site not specified: Secondary | ICD-10-CM | POA: Diagnosis not present

## 2023-04-12 DIAGNOSIS — E041 Nontoxic single thyroid nodule: Secondary | ICD-10-CM | POA: Diagnosis not present

## 2023-04-12 NOTE — ED Notes (Signed)
Patient ate one cookie and a cup of fruit. Patient then preceded to say "goodnight" and closed eyes. This NT will attempt to feed patient again once she awakens.

## 2023-04-12 NOTE — ED Notes (Signed)
This NT cleaned patient up after a small bowel movement. Patient was given a new gown as well as new bed sheets and a blanket.

## 2023-04-12 NOTE — ED Notes (Signed)
Pt gotten out of bed to be cleaned after urinating in bed. Pt given clean sheets, blankets, gown and brief. Pt back in bed in comfortable position. This tech attempted to obtain vitals. Pt not cooperative and repeatedly saying profanities while trying to take bp cuff off. RN made aware. Pt resting now with this tech sitting at bedside.

## 2023-04-12 NOTE — ED Notes (Signed)
Patient's husband at bedside

## 2023-04-12 NOTE — ED Provider Notes (Signed)
-----------------------------------------   5:44 AM on 04/12/2023 -----------------------------------------   Blood pressure 132/74, pulse 88, temperature 98.3 F (36.8 C), temperature source Oral, resp. rate 18, SpO2 99 %.  The patient is calm and cooperative at this time.  There have been no acute events since the last update.  Awaiting disposition plan from Social Work team.   Irean Hong, MD 04/12/23 [phone]

## 2023-04-12 NOTE — ED Notes (Signed)
EDT assumed role of safety sitter of this pt. Pt seen laying in bed with a soiled brief on. Soiled brief disposed of, peri care provided for, and new brief properly applied. Pt covered with blankets and positioned for comfort.  Pt ripped off new brief within 2 minutes of being applied and threw it at this EDT stating "Throw it out." Pt refuses to keep a brief on at this time. Pt repeating "Leave me alone. Leave me alone you son of a bitch. Leave me alone. I'm sleeping. I peed the bed. I peed the bed you son of a bitch."  Pt also trying to climb over side rails and strip all clothing off. Pt redirected back into bed. Privacy provided for since pt is wanting to get undressed in the hallway. Pt only has a hospital gown on at this time. Safety sitter remains at bedside.

## 2023-04-12 NOTE — ED Notes (Signed)
This NT changed patients sheets and helped the patient cover back up in bed. Patient now resting with eyes closed.

## 2023-04-12 NOTE — ED Notes (Signed)
EDT brushed pts hair to get pt to become more calm. Pt no longer yelling out and is remaining in bed with the blankets covering her. Breathing unlabored and equal chest rise and fall noted. Pt occasionally speaks repetative words mentioned earlier, but not to the same extent. Safety sitter re,aims at bedside.

## 2023-04-13 NOTE — ED Notes (Signed)
Pt took gown off and had stool on the sheet. This tech and Charitee, EDT cleaned pt with peri wipes and changed bed linen and pt gown.

## 2023-04-13 NOTE — ED Notes (Signed)
This NT assisted patient with eating dinner. Patient ate about 50% of tray.

## 2023-04-13 NOTE — ED Notes (Signed)
This NT and Caitlyn NT assisted patient in getting cleaned up after a small bowel movement. A new brief was placed on the patient and the bed sheet, gown, and blankets were changed.

## 2023-04-13 NOTE — ED Notes (Signed)
Pt removed blankets, stated "I peed the bed" and wiped stool on sheet. This tech performed peri care, cleaned pts hands, and replaced linen. Clean gown was also provided.

## 2023-04-13 NOTE — ED Notes (Signed)
This NT changed pt. Pt has a clean sheet on their bed, a clean gown and a clean brief.

## 2023-04-13 NOTE — ED Notes (Signed)
Pt refusing to keep gown and brief on. This tech attempted multiple times to redirect pt, and to apply brief and gown. Pt stated' throw it out" leave me alone. Leave me alone bitch" repeatedly. Privacy provided as patient is not able to be redirected and refuses to wear brief and gown.

## 2023-04-13 NOTE — ED Notes (Signed)
Patient pulling off brief after this NT has helped replace it multiple times. Patient continues to take it off and says "throw it out". Patient currently does not have a brief on. Patient is resting in bed with eyes closed.

## 2023-04-13 NOTE — ED Notes (Signed)
Husband at bedside.  

## 2023-04-13 NOTE — ED Notes (Signed)
This NT assisted patient with eating breakfast. Patient ate about 75% of tray. Patient now resting in bed with eyes closed.

## 2023-04-13 NOTE — ED Notes (Signed)
This NT assisted patient to restroom down the hallway. The patient had a small amount of clear, yellow urine in the commode. The patient was then returned to bed and this NT placed a clean brief on the patient. Patient is now resting in bed with eyes closed.

## 2023-04-13 NOTE — ED Notes (Signed)
This NT changed patients bed sheets at this time.

## 2023-04-14 DIAGNOSIS — N390 Urinary tract infection, site not specified: Secondary | ICD-10-CM | POA: Diagnosis not present

## 2023-04-14 DIAGNOSIS — E041 Nontoxic single thyroid nodule: Secondary | ICD-10-CM | POA: Diagnosis not present

## 2023-04-14 NOTE — ED Notes (Signed)
Pt continuously yelling, being verbally aggressive towards this tech. Will not keep a blanket, gown, or brief on. Will start to swing at this tech when I attempt to cover her back up with the blanket as she is naked underneath.

## 2023-04-14 NOTE — ED Notes (Signed)
Attempted to feed pt dinner, pt ate 100% of her cookies. This tech fed pt a bite of chicken, pt then proceeded to grab the chicken out of her mouth and throw it at this tech. Pt is demanding the HOB be lowered. This was done.

## 2023-04-14 NOTE — ED Notes (Signed)
Patient had bm and  wiped it on bed liens and bed rails patient  cleaned and changed bed liens  and rails also hands were clean much as patient allows.

## 2023-04-14 NOTE — ED Notes (Addendum)
Full linen change done by this tech. New chux pad and new brief applied. Pt immediately takes brief off and removed chux pad from underneath her. Pt will not keep a gown on either. Pt is naked at present.

## 2023-04-14 NOTE — ED Notes (Addendum)
Pt starting to yell every loudly "take me to heaven". When this tech asks pt to stop, pt states "fuck you bitch fuck you all"  Pt constantly repeating"fuck you fuck you fuck you" pt then looks at this tech, gets close to my face and smiles while saying "fuck you"

## 2023-04-14 NOTE — ED Notes (Signed)
VOL/Pending TOC Placement 

## 2023-04-14 NOTE — ED Notes (Signed)
EDT used purple wipes to scrub the dried feces off the walls and railings of the pts bed to make a more sanitary environment for pt. EDT attempted to clean feces out from under pts fingernails. Pt did not allow EDT to provide nail care.   Pt laying in bed with multiple blankets, breathing unlabored and equal chest rise and fall noted. Safety sitter remains at bedside.

## 2023-04-14 NOTE — ED Notes (Signed)
Pt is resting at this time with eyes closed. Resps equal and non labored

## 2023-04-14 NOTE — ED Notes (Signed)
Pt removed mesh underwear she had on. Pt then immediately proceeded to spread legs open and pee.

## 2023-04-14 NOTE — ED Notes (Signed)
Patient agitated in bed requiring frequent redirection from staff to remain in bed. Pt refusing to be clothed. Pt is at apparent baseline and in NAD. Pt noted to be breathing unlabored speaking in full sentences with symmetric chest rise and fall. Bed low and locked with side rails raised and sitter at bedside.

## 2023-04-14 NOTE — ED Notes (Signed)
Pt incontinent of stool and urine. EDT performed pericare, and put pt in a brief with mesh panties on top of brief for added security. Two chux pads placed under pt and pt repositioned for comfort. Pt still refused nail care at this time.  EDT scrubbed the floors with purple wipes to remove dried feces and food debris. EDT removed feces off the pt, floors, bed rails, and walls to the best of this EDT's current resources and abilities.

## 2023-04-14 NOTE — ED Notes (Signed)
Pt remains confused and attempting to get out of bed. Pt requiring frequent redirection from sitter and RN. Pt continues to refuse to wear gown or clothes. Pt and bed clean and dry at this time. Pt resting in bed free from sign of distress. Breathing unlabored speaking in full sentences with symmetric chest rise and fall. Bed low and locked with side rails raised.

## 2023-04-14 NOTE — ED Notes (Signed)
Pt peed in bed. Linen changed. Pt wiped peri area.

## 2023-04-14 NOTE — ED Notes (Addendum)
Pt husband at bedside. Pt able to name husband by name. Pt husband has brought pt cookies and candy and pepsi. Pt tells husband "yeah gary pick me up in about 2 weeks" pt husband states "I'm working on that"

## 2023-04-14 NOTE — ED Provider Notes (Signed)
Emergency Medicine Observation Re-evaluation Note  Physical Exam   BP (!) 134/104 (BP Location: Left Arm)   Pulse 82   Temp (!) 96.7 F (35.9 C) (Axillary)   Resp 16   SpO2 97%   Patient appears in no acute distress.  ED Course / MDM   No reported events during my shift at the time of this note.   Pt is awaiting dispo from SW   Pilar Jarvis MD    Pilar Jarvis, MD 04/14/23 Marlyne Beards

## 2023-04-14 NOTE — ED Notes (Signed)
Unable to obtain VS on pt, even with another staff member assisting.

## 2023-04-15 DIAGNOSIS — N390 Urinary tract infection, site not specified: Secondary | ICD-10-CM | POA: Diagnosis not present

## 2023-04-15 DIAGNOSIS — E041 Nontoxic single thyroid nodule: Secondary | ICD-10-CM | POA: Diagnosis not present

## 2023-04-15 NOTE — ED Notes (Signed)
Set up pt. for lunch. Patient ate a few bites of food and then stated "I don't want that". Pt. now is wanting to rest. Will attempt to give pt. food again later.

## 2023-04-15 NOTE — ED Provider Notes (Signed)
-----------------------------------------   2:41 AM on 04/15/2023 -----------------------------------------   Blood pressure (!) 134/104, pulse 82, temperature (!) 96.7 F (35.9 C), temperature source Axillary, resp. rate 16, SpO2 97 %.  The patient is calm and cooperative at this time.  There have been no acute events since the last update.  Awaiting disposition plan from [facility] team.   Loleta Rose, MD 04/15/23 [phone]

## 2023-04-15 NOTE — ED Notes (Addendum)
Pt incontinent of urine. EDT changed bed and preformed peri care. Changed sheets,provided a new gown and blankets. Attempted to get vitals signs Pt continued to rip of blood pressure cuff and would not stay still. Vitals not obtained at this time. Pt laying in bed with eyes closed. Repeating "goodnight' over and over. RN Notified

## 2023-04-15 NOTE — ED Notes (Signed)
Pt. took brief off then immediately urinated and had a bowel movement in the bed. Cleaned pt and changed pt's chuck pad and brief.

## 2023-04-15 NOTE — ED Notes (Signed)
Pt incontinent of stool. Pt cleansed with RN and EDT. New linens applied to bed and pt consented to wearing a gown. Warm blankets applied. Pt now resting in bed free from sign of distress. Breathing unlabored with symmetric chest rise and fall. Bed low and locked with side rails raised and sitter at bedside.

## 2023-04-15 NOTE — ED Notes (Signed)
Pt restless at this time. When asked what's wrong pt pulled off brief and statred to dig in her buttocks. Pt then rubbed feces on hand on the bedside rail. This NT discarded dirty brief and cleaned bedside rails, peri care preformed, new brief, chux pad, and linens donned. Pt resting comfortably.

## 2023-04-15 NOTE — ED Notes (Signed)
Pt. ripped of brief. Placed a new brief on pt and she appears to be resting comfortably.

## 2023-04-15 NOTE — ED Notes (Addendum)
This EDT set up PT for breakfast. Pt ate about 75% of tray with assist from this writer. Pt now resting in bed with eye closed.

## 2023-04-15 NOTE — ED Notes (Signed)
Family at bedside. 

## 2023-04-15 NOTE — ED Notes (Signed)
Pt stated that she "peed" at this time. Upon inspection pt had wet the bed. Pt taken to shower area where shower was provided. NT Abigail changed linens and placed new chux pad. New gown and brief applied to pt.

## 2023-04-15 NOTE — ED Notes (Signed)
Changed pt's brief. 

## 2023-04-15 NOTE — ED Notes (Addendum)
Pt had taken off brief and before brief can be replaced, Pt had peed the bed. Bed sheet, gown and bed pads replaced, and clean brief placed on pt.

## 2023-04-15 NOTE — ED Notes (Signed)
Pt offered snack of graham crackers and water at this time. Pt consumed 100% of crackers and 25% of water.

## 2023-04-15 NOTE — ED Notes (Signed)
Pt pulled brief off at this time. Pt then urinated in the bed. With help of NT Abigail and this NT pt was changed, peri car preformed, linens changed, new chux pad placed and new brief donned. Pt resting comfortably.

## 2023-04-15 NOTE — ED Notes (Signed)
Dinner tray brought at this time. Pt ate less than 25% of tray. Consumed 100% of beverage provided. This NT attempted to feed pt. Pt turned her head a refused multiple attempts stating "I don't want that." When attempting to feed pt.

## 2023-04-15 NOTE — ED Notes (Signed)
Pt continues to remove gown and chuck while digging in her rectum. Pt refuses keep gown on.

## 2023-04-16 DIAGNOSIS — E041 Nontoxic single thyroid nodule: Secondary | ICD-10-CM | POA: Diagnosis not present

## 2023-04-16 DIAGNOSIS — N390 Urinary tract infection, site not specified: Secondary | ICD-10-CM | POA: Diagnosis not present

## 2023-04-16 NOTE — ED Notes (Signed)
Pt woke up and immediately took off her brief and threw it off the bed. A new brief was placed. Pt also requested a cookie. A pack of graham crackers was given to the pt.

## 2023-04-16 NOTE — ED Provider Notes (Signed)
-----------------------------------------   6:57 AM on 04/16/2023 -----------------------------------------   Blood pressure (!) 149/118, pulse 90, temperature (!) 97 F (36.1 C), temperature source Oral, resp. rate 17, SpO2 100 %.  The patient is calm and cooperative at this time.  There have been no acute events since the last update.  Awaiting disposition plan from Social Work team.   Irean Hong, MD 04/16/23 [phone]

## 2023-04-16 NOTE — ED Notes (Signed)
Pt has pulled brief off and continues to reach in butt and pull poop out and smear it on covers. When pt is told to stop, pt states "leave me alone and fuck you". This tech continues to try an keep pts hands from out of her butt.

## 2023-04-16 NOTE — ED Notes (Signed)
This tech put a brief on pt. Pt continues to take brief off and telling tech to "leave me alone". This tech continues to try and keep brief on pt.

## 2023-04-16 NOTE — ED Notes (Signed)
Family at bedside. 

## 2023-04-16 NOTE — ED Notes (Signed)
Pt took chucks pad out from under herself and threw it to the end of the bed.

## 2023-04-16 NOTE — ED Notes (Signed)
Pt urinated and had a BM in brief. Pt cleaned and a clean brief placed on pt. A clean chucks pad placed under pt. Pt also provided with warm blankets at this time.

## 2023-04-16 NOTE — ED Notes (Signed)
Changed pt's brief, gown, and sheets.

## 2023-04-16 NOTE — ED Notes (Signed)
Pt is agitated and continued to take brief off and placing fingers in her rectum and wiping stool on sheets. Sitter and RN attempted to redirect pt without success. Pt's medication given at this time with applesauce.

## 2023-04-16 NOTE — ED Notes (Signed)
Pt was offered cheeseburger and french fries. Pt took one bite of cheeseburger and said "hate that" and gave it back. Offered french fries which pt ate 2 and said "hate that. Want cookie". Pt was given sweet tea from the meal tray.

## 2023-04-16 NOTE — ED Notes (Signed)
Pt refusing vitals at this time, will try again shortly

## 2023-04-16 NOTE — ED Notes (Signed)
Attempted to re-vital pt and perform head to toe assessment, pt fighting and cursing staff and tearing BP cuff and pulse ox off her finger and throwing it. Will attempt later when pt is less agitated. Sitter remains at bedside.

## 2023-04-16 NOTE — ED Notes (Signed)
Pt took gown off and removed blankets from herself. This writer placed gown back on pt. Pt covered back up and layed back in bed at this time, stating "I'm dreaming".

## 2023-04-16 NOTE — ED Notes (Signed)
Pt still refusing vitals, will alert next shift and see if they have better luck.

## 2023-04-16 NOTE — ED Notes (Signed)
Pt bottom cleaned up and pt bed sheets changed and chuck placed under pt butt, because pt will not keep brief on.

## 2023-04-16 NOTE — ED Notes (Signed)
Pt stating "Give me a cookie". Pt provided with a pack of graham crackers and a cup of apple juice at this time.

## 2023-04-17 MED ORDER — ENSURE ENLIVE PO LIQD
237.0000 mL | Freq: Two times a day (BID) | ORAL | Status: DC
  Administered 2023-04-17 – 2023-06-12 (×83): 237 mL via ORAL

## 2023-04-17 NOTE — ED Notes (Signed)
Patient's family is here to visit. Gave her cookies and soda.

## 2023-04-17 NOTE — ED Notes (Signed)
Pt didn't like lunch that was provided for her. Called [employer] for a sandwich and some cookies.

## 2023-04-17 NOTE — ED Notes (Signed)
Patient pulled brief off and replaced it with new one. Patient is trying to rest at this time.

## 2023-04-17 NOTE — ED Notes (Signed)
Assumed 1:1 care of pt.  Pt is currently sleeping at this time

## 2023-04-17 NOTE — ED Notes (Signed)
VOL/Pending TOC Placement 

## 2023-04-17 NOTE — ED Notes (Signed)
Pt had 100% of her Ensure.

## 2023-04-17 NOTE — ED Notes (Signed)
Patient urinated in depend. Changed bedding and gave fresh depend with new blankets. Patient is resting at this time.

## 2023-04-17 NOTE — ED Notes (Signed)
Pt removed brief and smeared BM on her sheets and the bed rail. Pt bed rail has been wiped clean.  Pt has a new sheet, clean blankets and a clean brief. Pt is resting comfortably.

## 2023-04-17 NOTE — ED Notes (Signed)
Pt removed brief. Brief had BM on it. Pt was cleaned up. Pt has on clean brief. Pt is resting comfortably.

## 2023-04-17 NOTE — ED Notes (Signed)
Pt had taken brief off and stuck her finger in her anus. Pt then smeared poop in the wall. Pt was cleaned up and placed into new brief. Bed sheets, blanket and bed pad were also replaced with new ones.

## 2023-04-17 NOTE — ED Notes (Signed)
Pt ate less than 25% for lunch. She ate her cookies.

## 2023-04-17 NOTE — ED Notes (Signed)
Patient is pulling depend on and off. Yelling take me to heaven!

## 2023-04-17 NOTE — ED Notes (Signed)
This tech and nt, sherry helped give pt shower, full linen change, new clothes, brief, and panties. Pt now resting in bed.

## 2023-04-17 NOTE — ED Notes (Signed)
Pt wet bed. Pt was changed. Pt has a clean sheet and a new brief. Pt is resting comfortably.

## 2023-04-17 NOTE — ED Notes (Signed)
Patient pulled brief off and replaced it with new one.

## 2023-04-17 NOTE — ED Notes (Signed)
Patient refused her dinner tray at this time.

## 2023-04-18 DIAGNOSIS — N390 Urinary tract infection, site not specified: Secondary | ICD-10-CM | POA: Diagnosis not present

## 2023-04-18 DIAGNOSIS — E041 Nontoxic single thyroid nodule: Secondary | ICD-10-CM | POA: Diagnosis not present

## 2023-04-18 NOTE — ED Notes (Signed)
Pt drank 100% of insure before this tech and Faith, EDT showered pt. Pt placed in new gown, and new brief. See Primary RN note @ [phone]. Pt did not tolerate shower well, was yelling the entirety of shower, but techs were able to wipe pt down with soap and wash her hair. Pt adamant about getting back into bed. Pt in bed now resting with eyes open. Pt very thankful for fresh gown and blankets.

## 2023-04-18 NOTE — ED Notes (Signed)
Pt dinner came. Pt spit out food. This NT asked nurse for some cereal and pt spit that out as well. Nurse is ordering different food for pt.

## 2023-04-18 NOTE — ED Notes (Signed)
Pt woke up asking for 'a cookie" pt was given a pack of crackers, she ate a few bites and is now back to resting with her eyes closed.

## 2023-04-18 NOTE — ED Notes (Signed)
Pt has been resting with eyes closed since 1030. Equal chest rise and fall noted.

## 2023-04-18 NOTE — ED Notes (Signed)
RN at bedside. Pt wiping feces all over bed and wall. RN and tech changed pt linens, wiped down bed, and applied new brief and clothing to pt. Pt refusing vital signs, pt will not hold still.

## 2023-04-18 NOTE — TOC Progression Note (Signed)
Transition of Care [facility]) - Progression Note    Patient Details  Name: Janet Murphy MRN: [id number] Date of Birth: 1956/01/02  [facility]) CM/SW Contact  Darleene Cleaver, [place] Phone Number: 04/18/2023, 10:05 AM  Clinical Narrative:    4/23 late entry  Per DSS, [facility] did not assess the adult. DSS worker Milinda Pointer have requested that two other facilities assess her. TOC awaiting updates regarding other facilities.   Expected Discharge Plan: Memory Care Barriers to Discharge: Continued Medical Work up  Expected Discharge Plan and Services   Discharge Planning Services: CM Consult   Living arrangements for the past 2 months: Single Family Home                 DME Arranged: N/A DME Agency: NA                   Social Determinants of Health (SDOH) Interventions SDOH Screenings   Tobacco Use: High Risk (02/08/2023)    Readmission Risk Interventions     No data to display

## 2023-04-18 NOTE — ED Notes (Addendum)
Pt is up again, pointing at the apple sauce on the meal tray. This was given, pt ate 100% of apple sauce. Pt states "I'm sleeping", covers up and lays back down. Pt is off and on resting with her eyes closed.

## 2023-04-18 NOTE — ED Notes (Signed)
Cleaned bed and side rails while nurse techs showered pt. Placed fresh linens on bed.

## 2023-04-18 NOTE — ED Notes (Signed)
Patient continues to pull brief off. This NT has replaced brief several times. This NT assisted patient to the nearest restroom where she peed a small amount. Patient returned to bed and brief was reapplied. Will continue monitoring patient.

## 2023-04-18 NOTE — ED Notes (Signed)
This NT changed the patient's brief after an episode of urinary incontinence.

## 2023-04-18 NOTE — TOC Progression Note (Addendum)
Transition of Care [facility]) - Progression Note    Patient Details  Name: Janet Murphy MRN: [id number] Date of Birth: 07-02-1956  Transition of Care [facility]) CM/SW Contact  Darleene Cleaver, [place] Phone Number: 04/18/2023, 10:03 AM  Clinical Narrative:  Late Entry from 04/15/2023  CSW left message for Baird Lyons from [facility] to see if she has any memory care facilities.  Per Baird Lyons she was continuing to look for placement at a memory care facility.    Expected Discharge Plan: Memory Care Barriers to Discharge: Continued Medical Work up  Expected Discharge Plan and Services   Discharge Planning Services: CM Consult   Living arrangements for the past 2 months: Single Family Home                 DME Arranged: N/A DME Agency: NA                   Social Determinants of Health (SDOH) Interventions SDOH Screenings   Tobacco Use: High Risk (02/08/2023)    Readmission Risk Interventions     No data to display

## 2023-04-18 NOTE — ED Provider Notes (Signed)
-----------------------------------------   6:08 AM on 04/18/2023 -----------------------------------------   Blood pressure 91/61, pulse (!) 58, temperature 97.7 F (36.5 C), temperature source Axillary, resp. rate 16, SpO2 99 %.  The patient is calm and cooperative at this time.  Sitter remains at bedside. There have been no acute events since the last update.  Awaiting disposition plan from Social Work team.   Irean Hong, MD 04/18/23 [phone]

## 2023-04-18 NOTE — ED Notes (Addendum)
Pt has taken brief off under covers and throws it to the end of the bed. Pt then is heard saying "ew ew ew ew" while looking under the covers. Pt then is seen wiping stool on the bed sheets, wall, and chux pad and states "I peed the bed I peed Im cold" pt then removes the chux pad from underneath her as well. Awaitin ghelp from another NT to shower pt and get her cleaned up.

## 2023-04-18 NOTE — ED Notes (Signed)
Patient ate about 25% of lunch tray. Patient said she was done eating and was ready to go back to sleep. Patient now resting with eyes closed.

## 2023-04-18 NOTE — TOC Progression Note (Signed)
Transition of Care [facility]) - Progression Note    Patient Details  Name: Janet Murphy MRN: [id number] Date of Birth: 04/26/1956  Transition of Care [facility]) CM/SW Contact  Darleene Cleaver, [place] Phone Number: 04/18/2023, 6:06 PM  Clinical Narrative:     DSS still working on trying to find placement, DSS contacting [facility] again and [facility].  Expected Discharge Plan: Memory Care Barriers to Discharge: Continued Medical Work up  Expected Discharge Plan and Services   Discharge Planning Services: CM Consult   Living arrangements for the past 2 months: Single Family Home                 DME Arranged: N/A DME Agency: NA                   Social Determinants of Health (SDOH) Interventions SDOH Screenings   Tobacco Use: High Risk (02/08/2023)    Readmission Risk Interventions     No data to display

## 2023-04-19 NOTE — ED Notes (Signed)
Pt ate a few bites of breakfast and said she wanted to wait to eat the rest

## 2023-04-19 NOTE — ED Notes (Signed)
Pt repeatedly takes off brief. This tech keeps putting one back on.

## 2023-04-19 NOTE — ED Notes (Addendum)
Pt soiled the bed and EDT's Gilford Rile and Duanne Moron was able to change the patient and bleach down the pt's bed. Pt had to sit in the chair until the bed dried and new clothes was brought pt stated " you will regret this I will see you to it" . Pt is currently laying in bed asking jesus to watch over her tonight. EDT at bedside.

## 2023-04-19 NOTE — ED Notes (Signed)
vol/toc.... 

## 2023-04-19 NOTE — ED Notes (Signed)
Pt. woke up and stated "good morning, what day is it". This tech told her good morning and today's date. The pt. proceeded to have a conversation with this tech about how she missed christmas and easter. This tech asked the pt. If she would like assistance going to the bathroom and the patient said "I don't have to go right now". I also asked the pt. If she would like some breakfast and she said "not at this time. I'll have some later." Patient is speaking in clear full sentences and is calm.

## 2023-04-19 NOTE — ED Notes (Signed)
Pt given PRN ativan due to agitation

## 2023-04-19 NOTE — ED Notes (Signed)
This tech tried to take pt's vitals. Was only able to take temp. because pt. kept taking off bp cuff and pulse oximeter

## 2023-04-19 NOTE — ED Notes (Signed)
Pt was given lunch tray. Pt ate a few bites of their actual lunch pt ate all of the cookies. Pt keep taking off their pants and underwear after being told numerous times that they needed to be left on.

## 2023-04-19 NOTE — ED Notes (Signed)
Pt was put on a clean brief with pants Pt was compliant. Pt was then given a pack of crackers with water. Notified Nurse. Pt was also given a clean blanket. Pt is now laying down resting.

## 2023-04-19 NOTE — ED Notes (Signed)
VOL/DSS/TOC Placement

## 2023-04-19 NOTE — ED Notes (Signed)
Pt had 100% of her Ensure. 

## 2023-04-19 NOTE — ED Notes (Signed)
Pt. took brief off, had a BM, and wiped it all over the sheets and bed. This tech cleaned the pt, put her in a new gown, put new linens on the bed, and cleaned the bed.

## 2023-04-19 NOTE — ED Notes (Signed)
Family at bedside. 

## 2023-04-20 DIAGNOSIS — N390 Urinary tract infection, site not specified: Secondary | ICD-10-CM | POA: Diagnosis not present

## 2023-04-20 DIAGNOSIS — E041 Nontoxic single thyroid nodule: Secondary | ICD-10-CM | POA: Diagnosis not present

## 2023-04-20 NOTE — ED Notes (Signed)
Lunch tray provided. Pt refused tray stating "Not eating that shit. Throw it out, throw it out, take it away."

## 2023-04-20 NOTE — ED Notes (Signed)
Pt continuously taking shirt off and wiping feces on bed rails. This tech redirected pt, provided peri care, mesh underwear, and changed linen.

## 2023-04-20 NOTE — ED Notes (Signed)
Patient is currently resting, breakfast has came for pt however, she is still sleeping at this time.

## 2023-04-20 NOTE — ED Notes (Signed)
Patient just woke up and ate all of her breakfast. Patient urinated in the bed. Sheets and etc has been changed.

## 2023-04-20 NOTE — ED Notes (Signed)
Attempted to obtain blood pressure unsuccessfully. Patient continually states "Take it off, it hurts. Take it off, it hurts." Moving arms continuously and pulling at BP cuff.

## 2023-04-20 NOTE — ED Notes (Signed)
Patient had an ensure.

## 2023-04-20 NOTE — ED Provider Notes (Signed)
No change overnight, patient's vital signs reassessed, sitter remains with the patient,  We continue to await TOC placement   Jene Every, MD 04/20/23 1503

## 2023-04-21 DIAGNOSIS — N390 Urinary tract infection, site not specified: Secondary | ICD-10-CM | POA: Diagnosis not present

## 2023-04-21 DIAGNOSIS — E041 Nontoxic single thyroid nodule: Secondary | ICD-10-CM | POA: Diagnosis not present

## 2023-04-21 NOTE — ED Notes (Signed)
Pt took off depend and refusing to put it back on. After multiple attempts this tech unable to put depend back on pt. This tech informed [name], Charity fundraiser.

## 2023-04-21 NOTE — ED Notes (Signed)
Pt removed brief and stated "fix it", pt then proceeded to place brief back on self. Pt removed brief once again and threw it to the end of the bed and covered back up with the blanket.

## 2023-04-21 NOTE — ED Notes (Signed)
Pt asking for cookies and pepsi repeatedly. Informed pt there were none. Pt continuously taking off depend again. Pt urinated on bed after taking off depend. This tech cleansed bed and pt. No other needs voiced at this time.

## 2023-04-21 NOTE — ED Notes (Signed)
Pt awake at this time. Husband at bedside. Husband giving pt cookies and a pepsi. This tech informed husband that pt has not ate dinner d/t her being asleep. Husband helping feed pt at this time.

## 2023-04-21 NOTE — ED Notes (Signed)
Pt had 100% of Ensure

## 2023-04-21 NOTE — ED Notes (Signed)
vol/toc.... 

## 2023-04-21 NOTE — ED Notes (Signed)
This tech fixed breakfast tray for pt. Patient ate 25% of breakfast and said "I don't want anymore". This tech put breakfast tray to the side for now and will see if the pt wants some more food later.

## 2023-04-21 NOTE — ED Notes (Signed)
Pt took mitts off by biting them. This tech and Megan, RN agreed to keep mitts off at this time.   Pt fell asleep at 1600; this tech continues to sit with pt.

## 2023-04-21 NOTE — ED Notes (Signed)
This tech took over as 1:1 Recruitment consultant. Report received from [place], [place].  Pt attempting to take depend off and not following orders. [name], RN informed about pts behavior.

## 2023-04-21 NOTE — ED Notes (Signed)
This tech attempted to get pt's vitals. Pt took off bp cuff multiple times and stated "this hurts, this hurts, this hurts". Pt also took off pulse oximeter. Tried to get temp but pt kept spitting out thermometer probe and wouldn't allow this tech to put it under her arm.

## 2023-04-21 NOTE — ED Notes (Signed)
Sitter at bedside. Pt currently being redirected by tech to stay in the bed.

## 2023-04-21 NOTE — ED Provider Notes (Signed)
-----------------------------------------   7:49 AM on 04/21/2023 -----------------------------------------   Blood pressure (!) 136/122, pulse (!) 103, temperature 97.9 F (36.6 C), temperature source Axillary, resp. rate 20, SpO2 99 %.  The patient is calm and cooperative at this time.  There have been no acute events since the last update.  Awaiting disposition plan from [facility] team.  Mild tachycardia at the time of last vital sign check, no other infectious signs or symptoms, will continue to monitor   Loleta Rose, MD 04/21/23 [phone]

## 2023-04-21 NOTE — ED Notes (Addendum)
Patient ate some peaches for lunch.Patient did not want to eat anymore of her lunch after the peaches. Changed her bedding out with fresh linen and clothes. Cleaned patient up,combed patients hair, and attempted to cleaned patients finger nails. Patient became very upset and didn't like her nails messed with. Patient is resting a this time.

## 2023-04-21 NOTE — ED Notes (Signed)
VOL/TOC Placement 

## 2023-04-21 NOTE — ED Notes (Signed)
Patient keeps taking off depend. Has been very anxious today and hard to get to follow orders.

## 2023-04-21 NOTE — ED Notes (Signed)
Pt removed brief. This writer placed brief back on pt at this time.

## 2023-04-21 NOTE — ED Notes (Signed)
Pt. took brief off with BM. Pt. smeared BM all over sheets and bed. As this tech was putting new linens on the bed, the pt urinated in the bed.This tech cleaned the pt, put the pt in a new gown. put clean linens on the bed, put new brief on pt., and cleaned the bed.

## 2023-04-21 NOTE — ED Notes (Signed)
Pt didn't have on brief due to pt removing brief earlier. Nt assisted pt with putting on brief. Pt is resting with NAD.

## 2023-04-21 NOTE — ED Notes (Signed)
Pt continuously taking off depend and unable to follow commands. Mitts applied on pt, but pt continuously biting at them and attempting to remove them. Pt still able to take depend off even with mitts on. [name], RN at bedside with this sitter.

## 2023-04-21 NOTE — ED Notes (Signed)
Patient has pulled depend on and off several times. Patient is yelling and very upset. RN gave meds and trying to get patient to calm down.

## 2023-04-21 NOTE — ED Notes (Signed)
Pt yelling "take me to heaven" and attempting to climb out of bed. Multiple attempts for redirection. PRN medication for agitation to be given.

## 2023-04-21 NOTE — ED Notes (Signed)
Pt urinated in bed. Pt cleaned and a clean brief and gown placed on pt. Bed linens also changed at this time and clean blanket given to pt.

## 2023-04-21 NOTE — ED Notes (Addendum)
Pt continuously attempting to take gown off.. pt is redirectable at this time.

## 2023-04-22 DIAGNOSIS — N390 Urinary tract infection, site not specified: Secondary | ICD-10-CM | POA: Diagnosis not present

## 2023-04-22 DIAGNOSIS — E041 Nontoxic single thyroid nodule: Secondary | ICD-10-CM | POA: Diagnosis not present

## 2023-04-22 NOTE — ED Notes (Signed)
Pt had a BM and urinated in brief. Pt cleaned and a clean brief placed on pt.

## 2023-04-22 NOTE — ED Notes (Signed)
Pt's husband came to visit. He gave the pt a chick fil a sandwich and pepsi. The pt ate and drank both of these. Now the pt is resting and watching tv.

## 2023-04-22 NOTE — ED Notes (Signed)
Breakfast tray at bedside 

## 2023-04-22 NOTE — ED Notes (Signed)
Pt urinated and had a BM in the bed after removing brief. This tech and Faith NT cleaned the pt., put new linens on the bed and put the pt in a new gown and brief. Pt is closing eyes and appears to have calmed down at the moment. Will continue to monitor.

## 2023-04-22 NOTE — ED Notes (Signed)
VS attempted by 2 NT. Patient uncooperative and not allowing staff to take VS. Not able to obtain at this time.

## 2023-04-22 NOTE — ED Notes (Signed)
Pt continuously takes off brief, gown, and removing pad from underneath her and throwing them to the end of bed.

## 2023-04-22 NOTE — ED Notes (Signed)
PATIENT HAS continuously kept taking off her brief and gown. Patient has poop all on the bed, urinated again in the bed. Staff has CHANGED PATIENT MULTIPLE TIMES, EVEN TRIED TO PUT MITTENS ON HER, PATIENT TOOK OFF THE MITTENS. PATIENT IS NOW NAKED AGAIN, RN NOTIFIED. PATIENT KEEPS DIGGING, WILL NOT KEEP ON ANYTHING AT THIS POINT.

## 2023-04-22 NOTE — ED Notes (Signed)
Fixed dinner tray for pt. The pt ate a few bites and said "I don't want anymore" multiple times. Pt drank 100% of her tea. Pt is now relaxing in the bed at this time. Will see if she wants more food later.

## 2023-04-22 NOTE — ED Notes (Signed)
Pt. urinated and had a BM in brief. This tech cleaned the pt and put a fresh brief on.

## 2023-04-22 NOTE — TOC Progression Note (Signed)
Transition of Care [facility]) - Progression Note    Patient Details  Name: Janet Murphy MRN: [id number] Date of Birth: [birth year]  Transition of Care [facility]) CM/SW Contact  Darleene Cleaver, [place] Phone Number: 04/22/2023, 7:16 PM  Clinical Narrative:     DSS still continuing to look for placement for patient.  Due to patient's behaviors, it has been challenging placement.   Expected Discharge Plan: Memory Care Barriers to Discharge: Continued Medical Work up  Expected Discharge Plan and Services   Discharge Planning Services: CM Consult   Living arrangements for the past 2 months: Single Family Home                 DME Arranged: N/A DME Agency: NA                   Social Determinants of Health (SDOH) Interventions SDOH Screenings   Tobacco Use: High Risk (02/08/2023)    Readmission Risk Interventions     No data to display

## 2023-04-22 NOTE — ED Notes (Signed)
Patient did not eat her breakfast this morning. Patient did have an ensure. 100% on her ensure. Staff gave patient a shower along with EDT Jess and RN Mardella Layman. Tristan Schroeder is changed & bed is bleached.

## 2023-04-22 NOTE — ED Provider Notes (Signed)
Emergency Medicine Observation Re-evaluation Note  Physical Exam   BP 138/87 (BP Location: Left Arm)   Pulse 81   Temp (!) 96.7 F (35.9 C) (Axillary)   Resp 19   SpO2 96%   Patient appears in no acute distress.  ED Course / MDM   No reported events during my shift at the time of this note.   Pt is awaiting dispo from SW   Pilar Jarvis MD    Pilar Jarvis, MD 04/22/23 [phone]

## 2023-04-22 NOTE — ED Notes (Signed)
Patient resting with eyes closed. NAD noted. Respirations even and unlabored. 

## 2023-04-22 NOTE — ED Notes (Signed)
Pt had a BM in brief and wiped it on the side of the bed and on the recliner next to the bed. Pt cleaned and a clean brief and gown placed on pt. Bed linens and chucks pad also changed and warm blankets provided to pt. Side of bed and recliner cleaned with bleach wipes at this time.

## 2023-04-22 NOTE — ED Notes (Signed)
Pt. took brief off and threw large BM on the recliner next to her bed. This tech cleaned the pt, put her in a new gown and brief, replaced the bed pad, and bleached the recliner. Pt is now resting in the bed.

## 2023-04-22 NOTE — ED Notes (Signed)
Pt. had a BM in the bed and smeared it on her pad. This tech and Faith NT cleaned the pt, put the pt in a new brief, and placed a fresh pad under the pt. The pt is relaxing at this time.

## 2023-04-22 NOTE — ED Notes (Signed)
Prepared lunch tray for pt. Patient repeatedly stated "I don't want that" and pushed the tray away from her. This tech set her tray aside and will see if she wants any food later.

## 2023-04-23 DIAGNOSIS — E041 Nontoxic single thyroid nodule: Secondary | ICD-10-CM | POA: Diagnosis not present

## 2023-04-23 DIAGNOSIS — N390 Urinary tract infection, site not specified: Secondary | ICD-10-CM | POA: Diagnosis not present

## 2023-04-23 NOTE — ED Notes (Signed)
Patient husband came to visit at 5:30pm today. Patient has had any behavorial issues, she has been calm and listening to music.

## 2023-04-23 NOTE — ED Notes (Signed)
Pt laying in bed, confused (baseline). NAD. Sitter at bedside.

## 2023-04-23 NOTE — ED Notes (Signed)
Pt was given breakfast, pt allowed tech to cut up her breakfast. Pt is currently eating breakfast watching TV with tech at bedside.

## 2023-04-23 NOTE — ED Provider Notes (Signed)
-----------------------------------------   5:44 AM on 04/23/2023 -----------------------------------------   Blood pressure 138/88, pulse 88, temperature 98.8 F (37.1 C), temperature source Oral, resp. rate 15, SpO2 98 %.  The patient is calm and cooperative at this time.  There have been no acute events since the last update.  Awaiting disposition plan from Social Work team.   Irean Hong, MD 04/23/23 [phone]

## 2023-04-23 NOTE — ED Notes (Signed)
Unable to obtain vital signs. Staff and another EDT tried to get vitals & another RN came into the room trying trying to help [place]. Vitals was unsuccessful at this time. Patient tried to bite EDT while trying to get her vitals. Patient has also, urinated in the bed while we was trying to get vitals.

## 2023-04-23 NOTE — ED Notes (Signed)
Pt awake, watching TV at this time.

## 2023-04-23 NOTE — ED Notes (Signed)
Patient ate all of her dinner. Patient is watching tv at this time.

## 2023-04-23 NOTE — ED Notes (Signed)
Pt had soiled the bed due to taking off her brief when the power had went out. And the pt was unable to watch TV. This tech changed the pt's bed linen,chuck and brief. Pt is currently laying in bed listening to music with tech at bedside. Pt asked what day it as when I told her it was Saturday May 4th,2024 pt stated "good, then tomorrow is the 5th and then ill be ready for Monday".

## 2023-04-23 NOTE — ED Notes (Signed)
This RN went to see patient during hourly rounding and in attempt to get vitals. Patient had a small area of smeared feces on fitted bed sheet in which EDT tried to change. This RN asked patient if I could change the bed sheet and she stated "goodbye" and to "close the door."

## 2023-04-23 NOTE — ED Notes (Signed)
Pt had a bowel movement. Pt was cleaned up by tech. Pt had perineal care done, fresh sheets, gown. And brief change. Tech did attempt to clean up under pt's finger nail. Pt is currently laying in bed watching TV with tech at bedside.

## 2023-04-24 DIAGNOSIS — E041 Nontoxic single thyroid nodule: Secondary | ICD-10-CM | POA: Diagnosis not present

## 2023-04-24 DIAGNOSIS — N390 Urinary tract infection, site not specified: Secondary | ICD-10-CM | POA: Diagnosis not present

## 2023-04-24 MED ORDER — OLANZAPINE 5 MG PO TBDP
5.0000 mg | ORAL_TABLET | Freq: Once | ORAL | Status: AC
  Administered 2023-04-24: 5 mg via ORAL
  Filled 2023-04-24: qty 1

## 2023-04-24 NOTE — ED Notes (Signed)
Changed pt's wet brief.

## 2023-04-24 NOTE — ED Notes (Signed)
Sitter reports pt aggitated, keeps pulling her diaper off and sticking her fingers in her bottom. Pt given 1mg  PRN ativan, will continue to monitor

## 2023-04-24 NOTE — ED Notes (Signed)
Pt still agitated. Pt will not keep brief on. This tech took lotion and gloves and rubbed pts lower legs and feet, thinking that might help relax her but that was unsuccessful.

## 2023-04-24 NOTE — ED Notes (Signed)
Lunch tray was delivered. This tech attempted to feed pt. Pt did not want anything on her tray. I got pt some apple sauce. Pt ate that and drank 240 oz of water.

## 2023-04-24 NOTE — ED Notes (Signed)
Pt extreamly aggitated. Pt would not keep brief on. Was picking at her rectum getting feces all over the bed, gown, and hands. Pt cleaned up, complete bed change was given. Hair combed. Pt  back in the bed.

## 2023-04-24 NOTE — ED Notes (Addendum)
This tech and rn, Jonny Ruiz did our best to help change pt. She ambulated to toilet and peed and said she had to "shit" and then stuck her fingers behind her to pull the poop out of her bottom and then proceeded to do it again with the other hand and then stuck her hand back into the toilet with pee to get the poop off her hand. This tech tried to stop her but it was too late. This tech tried to get pt to wipe herself but was too eager to get into bed. This tech and nurse had to clean her in the bed, and changed pt bed linen and put on new gown and brief. Pt laid back in bed and within 10 min she has taken off her brief threw it into the corner by the blue chair. This tech is not going to fight the pt to keep putting on brief and take off brief back and forth with pt. So as of right now the brief is off and she only has gown on. This tech straightened up room, wipped chairs off with wipes and the sink. Pt now resting in bed.

## 2023-04-24 NOTE — ED Notes (Signed)
This tech received report about the pt from night shift sitter. This tech took over safety sitting at this time. Pt in bed, calm, and no needs at this time.

## 2023-04-24 NOTE — ED Notes (Addendum)
This tech just tried to get a blood pressure cuff on pt but she was very resistant to wanting it off, I tried the best I could to get a decent reading but she just wouldn't let up. Pt is now back to resting with eyes closed.

## 2023-04-24 NOTE — ED Notes (Signed)
Pt still refusing to keep brief and gown on.

## 2023-04-24 NOTE — ED Notes (Signed)
Breakfast try was delivered. Will give it to pt when they wake up.

## 2023-04-24 NOTE — ED Notes (Signed)
Sitter reports pt aggitated, keeps pulling her diaper off and sticking her fingers in her bottom. Pt medicated per mar.

## 2023-04-24 NOTE — ED Notes (Addendum)
Will hold 10:00 meds until patient wakes up due to baseline dementia. ED sitter remains in room with patient.

## 2023-04-24 NOTE — ED Notes (Signed)
Pt still agitated. Won't keep brief on. Pt states she "shit in the bed". Bed checked not soild yet. Pt is not able to be redirected or to follow instructions to use the toilet.

## 2023-04-24 NOTE — ED Notes (Addendum)
Pt still sleeping. Rise and fall of chest noted. 

## 2023-04-24 NOTE — ED Notes (Signed)
Dr Vicente Males notified of pt's continued agitation, he will order another dose of Zyprexa, 5mg 

## 2023-04-24 NOTE — ED Notes (Addendum)
Pts dinner tray was delivered. Will give to pt when she wakes up.

## 2023-04-24 NOTE — ED Notes (Signed)
Pt resting in bed at this time with sitter in ful view. Pt in NAD at this time.

## 2023-04-24 NOTE — ED Provider Notes (Signed)
Emergency Medicine Observation Re-evaluation Note  Physical Exam   BP (!) 161/79 (BP Location: Right Arm)   Pulse 67   Temp 98.7 F (37.1 C) (Axillary)   Resp 18   SpO2 94%   Patient appears in no acute distress.  ED Course / MDM   No reported events during my shift at the time of this note.   Pt is awaiting dispo from SW   Pilar Jarvis MD    Pilar Jarvis, MD 04/24/23 [phone]

## 2023-04-24 NOTE — ED Notes (Signed)
This tech is ending my assignment. Report given to Midwife.

## 2023-04-24 NOTE — ED Notes (Addendum)
This tech was given report by previous tech, [employer]. Sitter updated me on pt day and how she tried to help pt which she put in the notes. The pt is currently resting.

## 2023-04-24 NOTE — ED Notes (Addendum)
Pt just woke up for a second and said "what's today, Sunday?" This tech said yes, and pt proceeded to say "I've been sleeping pretty good off and on".

## 2023-04-24 NOTE — ED Notes (Signed)
Husband visited for 10 minutes. Brought pt cookies and pepsi. PT went back to sleep as soon as husband left.

## 2023-04-24 NOTE — ED Notes (Signed)
Family at bedside. 

## 2023-04-24 NOTE — ED Notes (Signed)
Patient's soiled linens, bedsheet, and gown changed by safety sitter Jenne Campus, NT.

## 2023-04-25 DIAGNOSIS — E041 Nontoxic single thyroid nodule: Secondary | ICD-10-CM | POA: Diagnosis not present

## 2023-04-25 DIAGNOSIS — N390 Urinary tract infection, site not specified: Secondary | ICD-10-CM | POA: Diagnosis not present

## 2023-04-25 NOTE — ED Notes (Signed)
Assumed care from Dorian,RN. Pt resting comfortably in bed at this time. Pt sitter at bedside.

## 2023-04-25 NOTE — ED Notes (Signed)
Pt allowed me to give her a bed bath. I was able to clean her up completely. I changed her bed linen, chuck, brief and her clothes. Pt repeated " thank you" and "give me the covers". Once pt was fully dressed I gave her back her blankets. Pt is currently watching TV with this tech at bedside.

## 2023-04-25 NOTE — ED Notes (Signed)
Pt given PM snack.  

## 2023-04-25 NOTE — ED Provider Notes (Signed)
-----------------------------------------   5:37 AM on 04/25/2023 -----------------------------------------   Blood pressure (!) 140/115, pulse 68, temperature 97.9 F (36.6 C), temperature source Axillary, resp. rate 18, SpO2 94 %.  The patient is calm and cooperative at this time.  There have been no acute events since the last update.  Awaiting disposition plan from Social Work team.   Irean Hong, MD 04/25/23 [phone]

## 2023-04-25 NOTE — ED Notes (Signed)
This pt has been resting for the past 30 minutes. This pt continues to be incontinent  and void in the bed, this pt also continues to play in her stool even though instructed not to do so. I have changed and cleaned this pt up x 2. This pt is eating poorly this shift for me.

## 2023-04-25 NOTE — ED Notes (Signed)
Pt received her dinner tray but to only took about three bites before she turned her head away and stated " no I don't want anymore". This EDT did not throw pt food away it is on a bedside table just incase she gets hungry later. Pt is currently laying in bed watching TV verbally saying "Goodnight see you in the morning". EDT at pt bedside.

## 2023-04-25 NOTE — ED Notes (Signed)
Pt cleaned of incontinence. RN and tech changed linens and attempted to keep pt gown and brief on.

## 2023-04-26 NOTE — ED Notes (Signed)
Patient given lunch tray.

## 2023-04-26 NOTE — ED Notes (Signed)
Pt was given a shower. And her hair was washed. Pt was agitated the entire shower. Once the shower was over and pt got dressed pt proceeded to lay in the bed. Fresh sheets were placed on the pt's bed

## 2023-04-26 NOTE — ED Notes (Signed)
Pt eating breakfast, sitter at bedside 

## 2023-04-26 NOTE — ED Notes (Signed)
Linens changed, pt refused to keep brief on. Pt finger nails had poop in them, this RN attempted to clean out pt nails, pt only allowed a few nails to be cleaned.

## 2023-04-26 NOTE — ED Notes (Signed)
Patient ate 15% of her breakfast and had 3 cups of OJ.

## 2023-04-26 NOTE — ED Notes (Signed)
Pt not cooperative for this RN to get a tempeture or BP at this time

## 2023-04-26 NOTE — ED Notes (Signed)
Family at bedside. 

## 2023-04-26 NOTE — ED Notes (Signed)
Patient was changed and lien was changed as well.

## 2023-04-26 NOTE — ED Notes (Signed)
Pt has refused to keep on her underwear since 3 pm. Myself and pt's RN has tried to get pt to keep on belongings, Pt is not the only person in this area . Pt does not have a screen cover so pt took off brief and put on mesh underwear and purposely peed in the bed. Pt stated " I just peed, I just fucking peed in the bed." Pt if refusing any cooperation with EDT at this time. EDT is not going to force pt to wear underwear.

## 2023-04-26 NOTE — ED Notes (Signed)
Pt did not eat her dinner, even when her husband offered to feed her. Pt got emotional during her husbands visit she made it known to him that she was ready to go home. Pt stated that "being in the hallway makes her days longer, and when she was in the room with the TV she at least had something to watch to kill the time." Pt did state that she has a toothache when asked if she wanted anything for pain she said "NO". Pt allowed me to get a full set of vitals on her. Pt was teary eyed as I got her vitals when I asked what was wrong she stated clear as day " I hate being here I miss being at home with my husband and being here is doing nothing for me." Pt is currently laying in bed looking up at the ceiling EDT at bedside.

## 2023-04-26 NOTE — ED Notes (Signed)
Pt stuck her finger in her butt and then proceed to pee In the bed again. Once she was done pt put her clothes back on and placed her hands on her chest.

## 2023-04-26 NOTE — ED Notes (Signed)
Attempted to update vital signs but became highly combative.   Linens changed. Able to change linens and verify pt garments clean and non soiled.

## 2023-04-26 NOTE — ED Notes (Signed)
Pt is naked EDT at bedside

## 2023-04-26 NOTE — ED Notes (Signed)
VOLUNTARY/pending TOC placement 

## 2023-04-27 DIAGNOSIS — N390 Urinary tract infection, site not specified: Secondary | ICD-10-CM | POA: Diagnosis not present

## 2023-04-27 DIAGNOSIS — E041 Nontoxic single thyroid nodule: Secondary | ICD-10-CM | POA: Diagnosis not present

## 2023-04-27 MED ORDER — BENZOCAINE 10 % MT GEL
Freq: Four times a day (QID) | OROMUCOSAL | Status: DC | PRN
  Filled 2023-04-27 (×2): qty 9

## 2023-04-27 NOTE — ED Notes (Signed)
Pt had soiled the bed. Pt was given a bed bath , pt's bed was wiped down with disinfectant wipes. New linen and chuck was placed on pt's bed. Pt was given a clean brief and gown. Pt did not eat her lunch lunch is still in pt room covered just in case pt changes their mind before dinner arrives. Pt is currently laying in bed watching TV, EDT at bedside.

## 2023-04-27 NOTE — ED Notes (Signed)
Pt given lunch tray.

## 2023-04-27 NOTE — ED Notes (Signed)
This tech 1:1 safety sitter from [phone] pt having conversation and laying in bed.

## 2023-04-27 NOTE — ED Notes (Signed)
Family at bedside. 

## 2023-04-27 NOTE — ED Notes (Signed)
This EDT assisted patient with eating breakfast. Patient ate about 50% of breakfast then refused to eat any more. Patient now resting in bed watching the television.

## 2023-04-27 NOTE — ED Notes (Signed)
Pt began throwing feces in floor and smearing on self and bed rails. This EDT redirected pt. Pt cleaned, bed sanitized, clean linen and pad placed. Pt currently resting in bed  watching TV.

## 2023-04-27 NOTE — ED Notes (Signed)
Pt cleaned of stool, bed linens changed, pt changed into burgundy scrubs by this RN and Sydni EDT.

## 2023-04-27 NOTE — ED Provider Notes (Signed)
Emergency Medicine Observation Re-evaluation Note  Physical Exam   BP (!) 146/80 (BP Location: Left Arm)   Pulse 78   Temp (!) 97.3 F (36.3 C) (Axillary)   Resp 17   SpO2 95%   Patient appears in no acute distress.  ED Course / MDM   No reported events during my shift at the time of this note.   Pt is awaiting dispo from SW   Pilar Jarvis MD    Pilar Jarvis, MD 04/27/23 [phone]

## 2023-04-27 NOTE — ED Notes (Signed)
Pt refusing vital signs, will try again later.

## 2023-04-27 NOTE — ED Provider Notes (Signed)
-----------------------------------------   6:17 AM on 04/27/2023 -----------------------------------------   Blood pressure (!) 146/80, pulse 78, temperature (!) 97.3 F (36.3 C), temperature source Axillary, resp. rate 17, SpO2 95 %.  The patient is calm and cooperative at this time.  There have been no acute events since the last update.  Awaiting disposition plan from Social Work team.   Irean Hong, MD 04/27/23 [phone]

## 2023-04-27 NOTE — ED Notes (Signed)
Pt smeared poop all over the bed and self. EDT cleaned pt and sanitized pt's bed. Zach,RN came and helped get pt dressed. Pt has clean linen, gown and chuck with brief. Pt is currently laying in the bed listening to calm music. EDT tried to get pt's vitals signs and pt refused to cooperate.

## 2023-04-27 NOTE — ED Notes (Signed)
Pt redirectable for vitals and took meds with no difficulty.

## 2023-04-27 NOTE — ED Notes (Addendum)
EDT came out stating pt is very agitated and has begun smearing feces on the bed. PRN meds given. During med admin pt began taking off gown. Pt encouraged to keep gown on.

## 2023-04-27 NOTE — ED Notes (Signed)
Pt refused vitals at this time. Sitter remains at bedside. Denies needs. Calmly watching TV occasionally making comments about content.

## 2023-04-27 NOTE — ED Notes (Signed)
Pt changed and eating cookies. EDT at bedside.

## 2023-04-27 NOTE — ED Notes (Signed)
Crackers and oral hydration encouraged. Attempted to obtain vitals but unsuccessful.   Clothes and linens remain clean and dry.

## 2023-04-27 NOTE — ED Notes (Signed)
Patient drank one ensure with breakfast.

## 2023-04-27 NOTE — ED Notes (Signed)
vol/toc.... 

## 2023-04-27 NOTE — ED Notes (Signed)
RN offered orajel for toothpain that husband had dropped off. She refused orajel at this time.

## 2023-04-28 NOTE — ED Notes (Signed)
This tech prepared the pt's breakfast tray. The pt drank 100% of her ensure and orange juice. The pt also ate a banana. Pt. said "I don't want that" when this tech gave her pancakes and sausage. The tray has been set aside and will see if the pt wants any of the food later.

## 2023-04-28 NOTE — ED Notes (Addendum)
Pts husband arrives and attempts to wake pt up. Pt opens eyes but then continues to sleep. This tech as 1:1 Recruitment consultant.

## 2023-04-28 NOTE — ED Notes (Signed)
Pt took off brief multiple times and threw it on the floor. This tech replaced brief each time the pt took it off with a new one. The pt is now relaxing in bed watching TV.

## 2023-04-28 NOTE — ED Notes (Signed)
Pts husband left at this time. 

## 2023-04-28 NOTE — ED Notes (Addendum)
Pt is awake at this time. Pts husband brought food. Pt eating chicken, mashed potatoes and gravy, a nutty buddy bar and a pepsi.

## 2023-04-28 NOTE — ED Notes (Signed)
Pt refused to have temperature taken. Pt put thermometer in mouth bit it and then took out of mouth. Pt pushed nurses hand away when trying to get temperature under arm.

## 2023-04-28 NOTE — ED Notes (Addendum)
This tech prepared pt lunch tray. Pt pushed tray away and stated "throw it away I don't want that" when pt was given the tray. This EDT set aside lunch tray if the Pt wants any later on before dinner. Pt resting in bed at this time.

## 2023-04-28 NOTE — ED Notes (Signed)
Pt took off brief and gown multiple times and threw it on the floor. This tech replaced brief each time the pt took it off with a new one. Pt not wanting to keep brief on.

## 2023-04-28 NOTE — ED Notes (Signed)
Pt had bm. This tech and Megan, EDT changed pt and new blankets were give to pt.

## 2023-04-28 NOTE — ED Notes (Signed)
VOL/Still Pending TOC Placement 

## 2023-04-28 NOTE — ED Notes (Signed)
Pts brief changed by this tech and Mel, EDT.

## 2023-04-28 NOTE — ED Notes (Signed)
Pt continues to sleep; this tech continue as 1:1 Recruitment consultant.

## 2023-04-28 NOTE — ED Notes (Signed)
Pt continues to sleep; this tech remains as 1:1 Recruitment consultant.

## 2023-04-28 NOTE — ED Notes (Signed)
Pt asleep at this time. This tech remains 1:1 Recruitment consultant.

## 2023-04-28 NOTE — ED Notes (Signed)
Pt woke up, took her brief off and smeared her BM on the bed, bed linen, and herself. This tech and the nurse cleaned the pt, wiped down her bed, put new linens on the bed, and put a fresh brief and gown on the pt.

## 2023-04-28 NOTE — TOC Progression Note (Addendum)
Transition of Care [facility]) - Progression Note    Patient Details  Name: Chanaya Caliri MRN: [id number] Date of Birth: Aug 02, 1956  Transition of Care [facility]) CM/SW Contact  Darleene Cleaver, [place] Phone Number: 04/28/2023, 1:09 PM  Clinical Narrative:     DSS is the legal guardian, per Milinda Pointer at DSS they are still unable to find placement for patient at this time. They have reached out to all of the SCU's in the county as well as a few in [place] and [place] and they have not had any luck.  Per DSS patient is complaining of a tooth ache, TOC continuing to follow patient's progress throughout discharge planning.  1:48pm CSW was informed by patient's legal guardian [facility] DSS social worker Nia Manson Passey [phone], that patient's husband Mr. Franki Monte says she has a really bad toothache and regular pain relievers aren't working for her.  DSS was asking if there is someone that can examine patient and possibly get her on an antibiotic if needed.    1:50pm CSW updated bedside nurse to check with physician to see if there is a dentist that can be consulted to see patient.  Bedside nurse will let physician know.  TOC continuing to follow patient's progress throughout discharge planning.  Expected Discharge Plan: Memory Care Barriers to Discharge: Continued Medical Work up  Expected Discharge Plan and Services   Discharge Planning Services: CM Consult   Living arrangements for the past 2 months: Single Family Home                 DME Arranged: N/A DME Agency: NA                   Social Determinants of Health (SDOH) Interventions SDOH Screenings   Tobacco Use: High Risk (02/08/2023)    Readmission Risk Interventions     No data to display

## 2023-04-29 DIAGNOSIS — N390 Urinary tract infection, site not specified: Secondary | ICD-10-CM | POA: Diagnosis not present

## 2023-04-29 DIAGNOSIS — E041 Nontoxic single thyroid nodule: Secondary | ICD-10-CM | POA: Diagnosis not present

## 2023-04-29 NOTE — NC FL2 (Signed)
[place] MEDICAID [facility] LEVEL OF CARE FORM     IDENTIFICATION  Patient Name: Janet Murphy Birthdate: May 22, 1956 Sex: female Admission Date (Current Location): 02/08/2023  [place] and [place] Number:  Chiropodist and Address:  [facility], [address] [place], [place] [address]      Provider Number: [id number]  Attending Physician Name and Address:  Willy Eddy, MD.  Relative Name and Phone Number:  [facility] Legal Guardian  [phone]   McCullough,La[place]ha [place] DSS. Legal Guardian   [phone]  Deese,[name] Spouse   [phone]    Current Level of Care: Hospital Recommended Level of Care: Assisted Living Facility, Memory Care Prior Approval Number:    Date Approved/Denied:   PASRR Number:    Discharge Plan: Domiciliary (Rest home) (Memory Care ALF)    Current Diagnoses: Patient Active Problem List   Diagnosis Date Noted   Dementia with behavioral disturbance (HCC) 12/31/2022   Vitamin D deficiency 06/16/2017   B12 deficiency 06/16/2017   Underweight 06/15/2017   DDD (degenerative disc disease), thoracic 03/22/2014    Orientation RESPIRATION BLADDER Height & Weight     Self  Normal Incontinent Weight:   Height:     BEHAVIORAL SYMPTOMS/MOOD NEUROLOGICAL BOWEL NUTRITION STATUS  Wanderer, Verbally abusive, Other (Comment) (Smearing fecal matter)  (Dementia) Incontinent Diet (Regular)  AMBULATORY STATUS COMMUNICATION OF NEEDS Skin   Supervision Verbally Normal                       Personal Care Assistance Level of Assistance  Feeding, Dressing, Bathing Bathing Assistance: Limited assistance Feeding assistance: Independent Dressing Assistance: Limited assistance     Functional Limitations Info  Sight, Hearing, Speech Sight Info: Adequate Hearing Info: Adequate Speech Info: Adequate    SPECIAL CARE FACTORS FREQUENCY                       Contractures Contractures Info: Not present     Additional Factors Info  Code Status, Allergies, Psychotropic Code Status Info: Full code Allergies Info: NKDA Psychotropic Info: OLANZapine (ZYPREXA) tablet 2.5 mg         Current Medications (04/29/2023):  This is the current hospital active medication list Current Facility-Administered Medications  Medication Dose Route Frequency Provider Last Rate Last Admin   benzocaine (ORAJEL) 10 % mucosal gel   Mouth/Throat QID PRN Pilar Jarvis, MD       feeding supplement (ENSURE ENLIVE / ENSURE PLUS) liquid 237 mL  237 mL Oral BID BM Irean Hong, MD   237 mL at 04/28/23 1722   LORazepam (ATIVAN) tablet 1 mg  1 mg Oral Q6H PRN Dionne Bucy, MD   1 mg at 04/28/23 1124   melatonin tablet 2.5 mg  2.5 mg Oral QHS Irean Hong, MD   2.5 mg at 04/28/23 2222   OLANZapine (ZYPREXA) tablet 2.5 mg  2.5 mg Oral BID Gabriel Cirri F, NP   2.5 mg at 04/29/23 0916   OLANZapine zydis (ZYPREXA) disintegrating tablet 2.5 mg  2.5 mg Oral BID PRN Vanetta Mulders, NP   2.5 mg at 04/21/23 1515   Current Outpatient Medications  Medication Sig Dispense Refill   OLANZapine (ZYPREXA) 2.5 MG tablet Take 2.5 mg by mouth 2 (two) times daily.       Discharge Medications: Please see discharge summary for a list of discharge medications.  Relevant Imaging Results:  Relevant Lab Results:   Additional Information SS#: [id number]  Ross Ludwig, LCSW

## 2023-04-29 NOTE — ED Notes (Signed)
Attempted VS again at this time, pt will not allow this tech to place pulse ox on finger and will not allow this tech to place BP cuff on arm.

## 2023-04-29 NOTE — ED Notes (Signed)
Staff EDT & RN cleaned up patient room & changed sheets etc while mobility took pt for a walk. Patient is constantly taking off her brief and gown. Staff is not putting back on another brief at this time.

## 2023-04-29 NOTE — TOC Progression Note (Addendum)
Transition of Care [facility]) - Progression Note    Patient Details  Name: Sayla Kalen MRN: [id number] Date of Birth: 1956-07-09  [facility]) CM/SW Contact  Darleene Cleaver, [place] Phone Number: 04/29/2023, 12:36 PM  Clinical Narrative:     CSW was informed that [facility] will review patient's information.  CSW contacted [facility] DSS who is the legal guardian, and they gave permission for CSW to update facility with clinical notes and FL2.  CSW to email Veneda Melter, [phone], updated clinical notes and FL2.  TOC to continue to follow patient's progress throughout discharge planning.  5:30pm [facility] can not accept patient due to them not being able to accept Medicaid.  Will check with other memory care facilities.   Expected Discharge Plan: Memory Care Barriers to Discharge: Continued Medical Work up  Expected Discharge Plan and Services   Discharge Planning Services: CM Consult   Living arrangements for the past 2 months: Single Family Home                 DME Arranged: N/A DME Agency: NA                   Social Determinants of Health (SDOH) Interventions SDOH Screenings   Tobacco Use: High Risk (02/08/2023)    Readmission Risk Interventions     No data to display

## 2023-04-29 NOTE — ED Notes (Signed)
Report received from Keyri, RN 

## 2023-04-29 NOTE — Progress Notes (Signed)
Mobility Specialist - Progress Note    04/29/23 1352  Mobility  Activity Ambulated with assistance in hallway;Stood at bedside;Dangled on edge of bed  Level of Assistance Contact guard assist, steadying assist  Assistive Device None  Distance Ambulated (ft) 200 ft  Range of Motion/Exercises Active  Activity Response Tolerated well  Mobility Referral Yes  $Mobility charge 1 Mobility  Mobility Specialist Start Time (ACUTE ONLY) 0135  Mobility Specialist Stop Time (ACUTE ONLY) 0154  Mobility Specialist Time Calculation (min) (ACUTE ONLY) 19 min   Pt resting in bed upon entry. Pt STS and ambulates to hallway around NS for 1 1/2 full lap with no AD. Author used gait belt to steady patient and prevent fall due to tendency to grab objects and walls. Pt returned to bed and left with needs in reach. Pt RN/NT present upon exit.   Johnathan Hausen Mobility Specialist 04/29/23, 1:59 PM

## 2023-04-29 NOTE — ED Notes (Signed)
Pt is awake. Uncovering herself. Pt is only in brief and mesh underwear. Pt keeps stating "cold cold cold cold" but would not cover herself up. Pt throws her brief and underwear across the room. Covers back up and states "going back to bed". Pt is seen wiping stool on bedsheets.

## 2023-04-29 NOTE — ED Notes (Signed)
Dietary brought ensure, pt drank 100%.

## 2023-04-29 NOTE — ED Notes (Signed)
Pt HOB was sat up for breakfast. Pt ate 100% of banana, 100% of juice and 1 bite each of potatoes, eggs, and bacon. Pt states she is done, asking to go back to bed. HOB lowered after pt has swallowed all food.

## 2023-04-29 NOTE — ED Notes (Addendum)
Pt takes brief off underneath blanket. Pt then throws brief off of the side of the bed. Pt helps this tech place new brief on.

## 2023-04-29 NOTE — ED Notes (Signed)
Patient drank one Ensure at this time.

## 2023-04-29 NOTE — ED Notes (Signed)
Breakfast meal tray given to sitter at bedside at this time.

## 2023-04-29 NOTE — ED Notes (Addendum)
Patient did not eat her lunch.

## 2023-04-29 NOTE — ED Notes (Signed)
This tech and Morrie Sheldon Rn cleaned pt from stool. New bed sheets placed, new pillow case, new brief and new chux pad. New blankets provided as well. Pt was able to use bathing wipes to clean her rectum.   This tech attempted vitals. Pt will hold out arm and finger for BP cuff and pulse ox, however when placed on, pt immediately tries to rip BP cuff off stating "its hurting me its hurting me leave me alone" this tech attempted to let pt know that it would be done soon, pt rips off the BP cuff and throws pulse ox onto bed before any readings can be acquired.

## 2023-04-29 NOTE — ED Notes (Signed)
Pt sleeping at this time. Even chest rise and fall noted. No distress at this time.

## 2023-04-29 NOTE — ED Notes (Signed)
VOL/Pending Placement under review

## 2023-04-29 NOTE — ED Notes (Signed)
Pt has already taken the new brief off, throws it over the bed rail onto the floor. Demanding this tech to cover her up with blankets while she is covering herself up. Pt demanding to listen to music, music is already on.

## 2023-04-29 NOTE — ED Notes (Addendum)
Pt smeared BM on bed sheets. Pt given wipe to clean rectum. New sheet placed,new chux pad and brief placed as well.

## 2023-04-29 NOTE — ED Notes (Signed)
Pt refusing to eat dinner at this time, acting as though she finds the food unappealing. Will continue to sit at bedside and attempt again.

## 2023-04-29 NOTE — ED Provider Notes (Signed)
Emergency Medicine Observation Re-evaluation Note  Janet Murphy is a 67 y.o. female, seen on rounds today.  Pt initially presented to the ED for complaints of IVC Currently, the patient is resting comfortably.  Physical Exam  BP 122/89 (BP Location: Right Arm)   Pulse 97   Temp (!) 97.3 F (36.3 C) (Axillary)   Resp 18   SpO2 95%  Physical Exam General: No acute distress Cardiac: Well-perfused extremities Lungs: No respiratory distress Psych: Appropriate mood and affect  ED Course / MDM  EKG:   I have reviewed the labs performed to date as well as medications administered while in observation.  Recent changes in the last 24 hours include none.  Plan  Current plan is for placement.    Merwyn Katos, MD 04/29/23 [phone]

## 2023-04-30 DIAGNOSIS — E041 Nontoxic single thyroid nodule: Secondary | ICD-10-CM | POA: Diagnosis not present

## 2023-04-30 DIAGNOSIS — N390 Urinary tract infection, site not specified: Secondary | ICD-10-CM | POA: Diagnosis not present

## 2023-04-30 NOTE — ED Provider Notes (Signed)
-----------------------------------------   5:36 AM on 04/30/2023 -----------------------------------------   Blood pressure (!) 159/87, pulse 72, temperature 98.1 F (36.7 C), temperature source Axillary, resp. rate 20, SpO2 98 %.  The patient is calm and cooperative at this time.  There have been no acute events since the last update.  Awaiting disposition plan from Social Work team.   Irean Hong, MD 04/30/23 [phone]

## 2023-04-30 NOTE — ED Notes (Signed)
Pt had solid self, NT Firefighter had cleaned pt and changed brief, sheets, gown, and blankets. Pt then took off brief and gown, and preceded to put her finger in her anus and smeared BM all over bed and sheet. NT [place] and Clinical research associate cleaned pt again and placed new linens on pt and bed. Pt refusing to keep brief and gown on at this time

## 2023-04-30 NOTE — ED Notes (Signed)
Patient is vol pending toc placement 

## 2023-04-30 NOTE — ED Notes (Signed)
Pt has been wanting to be in dark with no television. Prior to rotating helped NT change linen,brief and gown. Pt is now resting in the dark.

## 2023-05-01 NOTE — ED Notes (Signed)
Pt had put on gown on her own

## 2023-05-01 NOTE — ED Notes (Signed)
Pt takes brief off underneath blanket. Pt then throws brief off of the side of the bed. Pt helps this tech place another new brief on. Patient is constantly taking off her brief and gown. PT constantly asking for light to be turned off. Lights are off in room.

## 2023-05-01 NOTE — ED Notes (Signed)
Pt had bm; this tech changed pt and placed new brief. No other needs voiced at this time.

## 2023-05-01 NOTE — ED Notes (Addendum)
This tech attempted to obtain VS on Pt. Pt moving around and stated " that hurts take it off" while ripping off cuff. Will try at at a later time. Rn notified

## 2023-05-01 NOTE — ED Notes (Signed)
Pt and bed soiled in urine. This tech changed pt. Pt changed into clean scrubs, clean linen applied and warm blankets given. Pt laying in bed watching tv. No other needs voiced at this time.

## 2023-05-01 NOTE — ED Notes (Addendum)
Pt consumed 100% of Ensure at this time.

## 2023-05-01 NOTE — ED Notes (Addendum)
Pt ate 50% of breakfast and an apple sauce.

## 2023-05-01 NOTE — ED Notes (Signed)
Pt stated " Im hungry " Nurse notified. Pt was given boxed lunch and water. Pt ate crackers and 25 % of chips. Denied sandwich. Pt is now resting while Tech at bedside.

## 2023-05-01 NOTE — ED Notes (Signed)
Pts husband left at this time.

## 2023-05-01 NOTE — ED Notes (Addendum)
Pt had solid self with BM this tech cleaned pt  and applied new sheets,gown, brief and blankets. Pt resting in bed comfortably with TV.

## 2023-05-01 NOTE — ED Notes (Addendum)
Pt taking off brief and throwing it onto the ground also taking off gown.. This placed a new brief and gown on every time. Pt digging in her anus and smeared BM all over sheets. new sheets provided. Pt now wanting lights turned off and resting with eye closed in bed.

## 2023-05-01 NOTE — ED Notes (Signed)
Pt soiled with urine; pt changed at this time.

## 2023-05-01 NOTE — ED Notes (Signed)
Pt digging in anus and taking off brief,gown and blankets.

## 2023-05-01 NOTE — ED Notes (Signed)
Pt started to take off brief and pants. This tech put on new brief and gave Pt additional blankets. Pt was also given crackers and water. Pt is now resting with tech at bedside. Nurse notified.

## 2023-05-01 NOTE — ED Notes (Signed)
VOL/Pending Placement 

## 2023-05-01 NOTE — ED Notes (Signed)
Pts husband at bedside visiting. Pts husband brought pt snacks and a pepsi.

## 2023-05-02 NOTE — ED Notes (Signed)
This NT gave pt shower. NT Sydni changed pt bed linens. EVS came and mopped the floor and took out the trash and dirty linens.Pt is resting comfortably.

## 2023-05-02 NOTE — ED Notes (Signed)
Pt was singing gospel music, so this NT cut on some gospel music for pt. It seemed to help keep her calm. Pt is now sleeping.

## 2023-05-02 NOTE — ED Notes (Signed)
Pt walked to the bathroom and new brief placed. Back in bed denies other needs

## 2023-05-03 DIAGNOSIS — N390 Urinary tract infection, site not specified: Secondary | ICD-10-CM | POA: Diagnosis not present

## 2023-05-03 DIAGNOSIS — E041 Nontoxic single thyroid nodule: Secondary | ICD-10-CM | POA: Diagnosis not present

## 2023-05-03 NOTE — ED Notes (Signed)
Pt continues to be asleep. 

## 2023-05-03 NOTE — TOC Progression Note (Signed)
Transition of Care [facility]) - Progression Note    Patient Details  Name: Janet Murphy MRN: [id number] Date of Birth: 1956-09-11  Transition of Care [facility]) CM/SW Contact  Darleene Cleaver, [place] Phone Number: 05/03/2023, 9:38 PM  Clinical Narrative:     TOC and DSS legal guardian continuing to try to find placement for patient.  Patient will need a memory care facility that can accept Medicaid.  Local ALFs do not have beds available, will continue to expand search to out of county.   Expected Discharge Plan: Memory Care Barriers to Discharge: Continued Medical Work up  Expected Discharge Plan and Services   Discharge Planning Services: CM Consult   Living arrangements for the past 2 months: Single Family Home                 DME Arranged: N/A DME Agency: NA                   Social Determinants of Health (SDOH) Interventions SDOH Screenings   Tobacco Use: High Risk (02/08/2023)    Readmission Risk Interventions     No data to display

## 2023-05-03 NOTE — ED Notes (Signed)
PT soiled bed and gown. NT directed Pt to toilet. RN and and NT performed pericare, cleaned and replaced linen.

## 2023-05-03 NOTE — ED Notes (Signed)
Pt states that her mouth is hurting but attempted to give pt orajel as ordered but pt refused at this time. Advised sitter at bedside to notify this RN if pt requesting medicine and continues to complain of pain.

## 2023-05-03 NOTE — ED Provider Notes (Signed)
Emergency Medicine Observation Re-evaluation Note  Janet Murphy is a 67 y.o. female, seen on rounds today.  Pt initially presented to the ED for complaints of IVC  Currently, the patient is calm, no acute complaints.  Physical Exam  Blood pressure (!) 126/94, pulse 74, temperature 98 F (36.7 C), temperature source Oral, resp. rate 20, SpO2 99 %. Physical Exam General: NAD Lungs: CTAB Psych: not agitated  ED Course / MDM  EKG:    I have reviewed the labs performed to date as well as medications administered while in observation.  Recent changes in the last 24 hours include no acute events overnight.    Plan  Current plan is for TOC placement.    Sharman Cheek, MD 05/03/23 0830

## 2023-05-03 NOTE — ED Notes (Signed)
Pt woke up. Inc/peri care provided. Clean warmed blanket provided. Dry brief applied. Pt ate a few bites of breakfast and graham crackers. Pt watched tv for a few mins. Allowed this tech to get VS at this moment. Pt requested tv off. Pt is back to sleep. No other concerns noticed.

## 2023-05-03 NOTE — ED Notes (Signed)
Report received from Dorian, RN 

## 2023-05-03 NOTE — ED Notes (Signed)
Pt remove gown and brief. NT tried redirected pt but was unsuccessful. Pt cover self with blankets and laid in bed.

## 2023-05-03 NOTE — ED Notes (Signed)
Breakfast given to sitter at bedside at this time. Pt currently asleep at this time.

## 2023-05-04 DIAGNOSIS — E041 Nontoxic single thyroid nodule: Secondary | ICD-10-CM | POA: Diagnosis not present

## 2023-05-04 DIAGNOSIS — N390 Urinary tract infection, site not specified: Secondary | ICD-10-CM | POA: Diagnosis not present

## 2023-05-04 NOTE — ED Notes (Signed)
This tech and Faith, EDT cleansed pt d/t pt having bm. Clean linen, gown and warm blankets provided. No other needs voiced at this time.

## 2023-05-04 NOTE — ED Notes (Signed)
Attempted to get vital signs at this time. Pt refused, stating "leave me alone".

## 2023-05-04 NOTE — ED Notes (Signed)
Pt anxious and taking clothes off and trying to get out of bed. Pt not able to be redirected easily. Pt willingly took PRN. Sitter remains at bedside with pt. Will continue to monitor.

## 2023-05-04 NOTE — ED Provider Notes (Signed)
-----------------------------------------   4:59 AM on 05/04/2023 -----------------------------------------   Blood pressure (!) 134/99, pulse 70, temperature 97.7 F (36.5 C), temperature source Axillary, resp. rate 19, SpO2 99 %.  The patient is calm and cooperative at this time.  There have been no acute events since the last update.  Awaiting disposition plan from [facility] team.   Loleta Rose, MD 05/04/23 [phone]

## 2023-05-04 NOTE — ED Notes (Signed)
Pt refuses to keep brief placed by this EDT on. Pt has ripped clean briefs off multiple times and thrown them at the wall. EDT has a chux under pt currently as elimination management and will attempt to reapply a brief when pt is more receptive.

## 2023-05-04 NOTE — ED Notes (Signed)
Pt had a small BM and wiped it on the bed sheets. Pt cleaned and a brief and clean gown were placed on pt at this time. Bed linens also changed and pt's hands cleaned a this time.

## 2023-05-04 NOTE — ED Notes (Signed)
VOL/Pending Placement TOC & DSS Leg.Guardian

## 2023-05-04 NOTE — ED Notes (Signed)
Pt given dinner tray, pt refusing at this time.

## 2023-05-04 NOTE — ED Notes (Signed)
This RN went in to check on pt, pt states "get out." Pt asked if she had any needs and she declined. NT at bedside with pt at this time.

## 2023-05-04 NOTE — ED Notes (Signed)
Patient walked to bathroom and had a bowel movement

## 2023-05-04 NOTE — ED Notes (Signed)
Pt continuously taking brief off and refusing to allow it to be placed back on.

## 2023-05-04 NOTE — ED Notes (Signed)
This tech as 1:1 Recruitment consultant assuming care at this time.  Pt started putting hands on bottom and this tech noticed no brief on pt. Pt had bm; this tech cleansed pt and placed clean brief. A new gown and clean warm blankets placed as well. Pt laying watching tv; no other needs voiced.

## 2023-05-04 NOTE — ED Notes (Signed)
Pt removed brief and threw it across the room once again. Pt refused to allow brief to be placed back on.

## 2023-05-04 NOTE — ED Notes (Signed)
Pt removed brief at this time and threw it across the room. This writer placed brief back on pt at this time.

## 2023-05-04 NOTE — ED Notes (Signed)
Pts husband at bedside. Pts husband brought pt wings and pizza but pt refused. Pts husband also brought a pepsi and cookies; pt eating cookies and drinking pepsi at this time.

## 2023-05-04 NOTE — ED Notes (Signed)
Husband left at this time.

## 2023-05-04 NOTE — ED Notes (Signed)
Full linen change and bed bath provided b this EDT due to p being incontinent or urine and stool. Pts fingers cleaned to pts tolerance in effort to remove stool from beneath pts fingernails. Clean linens applied and pt repositioned in bed. EDT offered pt fresh ice water. Pt sat up, took one sip, then laid back don stating "Ew". Pt currently resting in bed with clean brief on. Even chest rise and fall noted. EDT safety sitter at bedside.

## 2023-05-04 NOTE — ED Notes (Signed)
EDT attempted to place a new brief on pt due to her voiding on the chux pad that was in place prior to pt throwing it out of the bed. Pt remains unwilling to cooperate and keep a brief on.

## 2023-05-04 NOTE — ED Notes (Signed)
Pt continuously taking gown off.

## 2023-05-04 NOTE — ED Notes (Signed)
Pt urinated in brief and took brief off. Pt cleaned and a clean brief placed on pt. Bed linens also changed at this time.

## 2023-05-04 NOTE — ED Notes (Signed)
Patient states food makes her tooth hurts informed patient that she needed to eat and the nurse could give her pain medication if she needs it before or after , patient still refused.

## 2023-05-04 NOTE — ED Notes (Signed)
Patient still refused lunch but had a ensure at this time.

## 2023-05-05 NOTE — ED Notes (Signed)
This tech as 1:1 Recruitment consultant assuming care at this time

## 2023-05-05 NOTE — ED Notes (Signed)
  Pt refuses to keep brief and gown on. Pt keeps removing chux and throwing all clean belongings across the room at the wall. Pt also refuses to eat dinner tray provided with exception of an ensure and cookies. This EDT attempted to assist with feeding and pt continued to decline. Pt pushed bedside table across the room spilling her drink out on the floor. Pt is currently in bed with clean chux.

## 2023-05-05 NOTE — ED Notes (Signed)
Brief and gown placed on pt at this time.

## 2023-05-05 NOTE — ED Notes (Signed)
Patient given dinner tray.

## 2023-05-05 NOTE — ED Notes (Signed)
Pt drank 100% of ensure

## 2023-05-05 NOTE — ED Notes (Signed)
Pt husband at bedside at this time. Pt ate chicken tenders, cookie, and pepsi provided by husband. Pt stated "I peed in the bed". This EDT attempted to change pt but they kept swatting hands away and saying no.

## 2023-05-05 NOTE — ED Notes (Signed)
Pt refusing to keep brief and gown on at this time.

## 2023-05-05 NOTE — TOC Progression Note (Addendum)
Transition of Care [facility]) - Progression Note    Patient Details  Name: Janet Murphy MRN: [id number] Date of Birth: 1956/12/05  [facility] of [facility]) CM/SW Contact  Darolyn Rua, [place] Phone Number: 05/05/2023, 9:23 AM  Clinical Narrative:      Update: Milinda Pointer responded that the facility that could accept patient was private pay at $5900/month which family was unable to afford. She reports she continues to look for placement, inquired if patient is still smearing feces on things as this has been a concern for facilities in referrals, CSW updated her on this.     CSW has reached out to legal guardian DSS Sharlyne Cai for updated on memory care placement, pending response at this time.   Expected Discharge Plan: Memory Care Barriers to Discharge: Continued Medical Work up  Expected Discharge Plan and Services   Discharge Planning Services: CM Consult   Living arrangements for the past 2 months: Single Family Home                 DME Arranged: N/A DME Agency: NA                   Social Determinants of Health (SDOH) Interventions SDOH Screenings   Tobacco Use: High Risk (02/08/2023)    Readmission Risk Interventions     No data to display

## 2023-05-05 NOTE — ED Notes (Signed)
Pt removed soiled brief. This writer cleaned pt and placed a new brief on pt. Pt denies any other needs at this time.

## 2023-05-05 NOTE — ED Notes (Signed)
Pt stated "I'm wet, I peed in the bed." This NT changed gown, linens, chux, and placed a clean brief on pt. NT attempted to perform peri care on pt. Pt would not uncover peri area and stated "No, no, no." Pt is calm at this time and is not attempting to take clean brief off.

## 2023-05-05 NOTE — ED Notes (Signed)
EDT removed soiled chux and placed a clean chux under pt at this time.

## 2023-05-05 NOTE — ED Notes (Addendum)
Pt ate about 25% of breakfast.

## 2023-05-05 NOTE — ED Notes (Signed)
Pt moved blankets and urinated in the bed. Pt cleaned and a brief and gown placed on pt. Bed linens also changed at this time.

## 2023-05-05 NOTE — ED Notes (Addendum)
Pt refuses to keep brief placed by this EDT on. Pt has ripped clean briefs off multiple times and thrown them at the wall. EDT has a chux under pt currently and will attempt to reapply a brief

## 2023-05-06 NOTE — ED Notes (Signed)
Pt started to pull at her brief I asked pt if she needed a new brief, pt said "yes". Peri care provided and new brief placed on pt.

## 2023-05-06 NOTE — ED Notes (Addendum)
Pt has soiled and taken off her brief 3x within the last 30 minutes. Peri care was done each time. Pt also kept taking off her gown, pt seems a little restless. Riki Rusk, RN notified of patient's discomfort. Pt given a drink and is now lying in bed.   Bed linen changed as well.

## 2023-05-06 NOTE — ED Notes (Addendum)
Pt now completely naked with the provided gown and blankets thrown at the end of the bed. Pt repeating "I'm cold, I'm cold, I'm cold"

## 2023-05-06 NOTE — ED Notes (Signed)
Pt still refusing to ear a brief. Chux pad changed due to urinary incontinence and pt repositioned in bed. Pt now willing to wear a gown and has blankets on for comfort. EDT safety sitter still at bedside.

## 2023-05-06 NOTE — ED Notes (Signed)
Pt ripped of clean brief just placed by this EDT. Incontinence chux pad remains in place under pt. Will attempt to place another brief on pt when pt is more receptive to keeping a brief on. EDT safety sitter remains at bedside.

## 2023-05-06 NOTE — ED Notes (Signed)
Pt had 1 BM in bed. Pt given partial bed bath by this tech and Byrd Hesselbach, EDT. Full linen changed and brief changed. This tech and Byrd Hesselbach, EDT noticed pt's second toe on right foot red/purple. RN made aware.

## 2023-05-06 NOTE — ED Notes (Signed)
Brief and linen changed. 1:1 sitter at bedside for safety

## 2023-05-06 NOTE — ED Notes (Signed)
Pt's husband at the bedside at this time.

## 2023-05-06 NOTE — ED Notes (Signed)
This tech took over safety sitting 1:1. Report received from off going sitter. Pt is resting comfortably in bed at this time. No other needs at this time.

## 2023-05-07 DIAGNOSIS — E041 Nontoxic single thyroid nodule: Secondary | ICD-10-CM | POA: Diagnosis not present

## 2023-05-07 DIAGNOSIS — N390 Urinary tract infection, site not specified: Secondary | ICD-10-CM | POA: Diagnosis not present

## 2023-05-07 NOTE — ED Notes (Signed)
Pt laying in bed restfully, is alert but disoriented x4. Pt clean and dry. Sitter at bedside. NAD noted, respirations even and unlabored.

## 2023-05-07 NOTE — ED Provider Notes (Signed)
-----------------------------------------   6:47 AM on 05/07/2023 -----------------------------------------   Blood pressure 108/82, pulse 64, temperature 98 F (36.7 C), temperature source Oral, resp. rate 15, SpO2 96 %.  The patient is calm and cooperative at this time.  There have been no acute events since the last update.  Awaiting disposition plan from Social Work team.   Irean Hong, MD 05/07/23 [phone]

## 2023-05-07 NOTE — ED Notes (Addendum)
Pt does not allow staff to take vitals- becomes physically combative and states "get away from me." Skin is warm, dry, and intact. Capillary refill <3 seconds. Radial pulse 2+. Respirations even and unlabored.

## 2023-05-07 NOTE — ED Notes (Signed)
Pt given snacks per request- dietary called to send ensure.

## 2023-05-08 NOTE — ED Notes (Signed)
Pt assisted with eating hospital provided meal tray. Pt consumed approximately 25% of meal.

## 2023-05-08 NOTE — ED Notes (Signed)
Pt will not keep a brief on at this time.

## 2023-05-08 NOTE — ED Notes (Addendum)
Pt continues to remove brief and gown as soon at it is placed on her. This NT has changed pt brief several times within the past hour. Pt got out of bed to use the toilet, pt had BM in the toilet. Pt used her hands to clean herself and got back into bed. This NT changed pt bed lined, provided peri care, and cleaned pt hands. Pt is in bed now resting with eyes closed and stated "I'm tired".

## 2023-05-08 NOTE — ED Notes (Signed)
Pt urinated in bed. Pt cleaned and a brief placed on pt. Bed linens and chucks pad also changed at this time.

## 2023-05-08 NOTE — ED Notes (Signed)
This tech offered food and pt only took a bite of banana and did drink all of orange juice.

## 2023-05-08 NOTE — ED Notes (Signed)
Pt urinated in bed. Pt cleaned and a brief placed on pt. Bed linens and chucks pad also changed. Pt provided with warm blankets at this time.

## 2023-05-08 NOTE — ED Notes (Addendum)
Pt urinated in bed. Pt cleaned and a brief placed on pt. Bed linens and chucks pad also changed at this time and pt provided with clean gown.

## 2023-05-08 NOTE — ED Notes (Addendum)
Pt still complaining of not feeling good and tooth pain. Pt states "my tooth hurts I have an abscess". This tech asked pt what tooth and pt points to the bottom left side.

## 2023-05-08 NOTE — ED Notes (Signed)
Pt drank entire Ensure.

## 2023-05-08 NOTE — ED Notes (Signed)
Pt continuously taking gown and brief off.

## 2023-05-08 NOTE — ED Notes (Signed)
Pt complaining of tooth pain per EDT.

## 2023-05-08 NOTE — ED Notes (Signed)
Pt urinated on self. Clean brief and chux applied. No other needs voiced at this time.

## 2023-05-08 NOTE — ED Notes (Addendum)
Pt took brief off and threw it. This writer placed brief back on pt.

## 2023-05-08 NOTE — ED Notes (Signed)
Placed gown and brief on pt; pt continuously taking both items on and off.

## 2023-05-08 NOTE — ED Notes (Signed)
Pt woke up and removed her brief, throwing it onto the floor. This NT provided peri care and placed new brief on pt. Pt is resting comfortably in bed.

## 2023-05-08 NOTE — ED Notes (Signed)
Pt complaining that she doesn't feel good over and over and that she wants to rest.

## 2023-05-08 NOTE — ED Notes (Signed)
Pt had a BM and wiped stool on the side rail of the bed. Pt cleaned and a clean brief placed on pt. Pt provided with warm blankets at this time.

## 2023-05-08 NOTE — ED Notes (Signed)
Pt removed brief and threw it.

## 2023-05-08 NOTE — ED Notes (Signed)
Pt complaining of tooth hurting as well as hip. When asked which side her hip is hurting pt doesn't respond.

## 2023-05-08 NOTE — ED Notes (Signed)
Husband at beside. Pt drinking pepsi and eating cookies that husband brought.

## 2023-05-08 NOTE — ED Notes (Signed)
This tech and nurse, Alaina, helped change pt bed and put pt back in bed. Ensure was given to pt and pt now resting

## 2023-05-08 NOTE — ED Notes (Signed)
Pt removed soiled brief. Pt cleaned and a clean brief placed on pt.

## 2023-05-08 NOTE — ED Notes (Signed)
This tech attempted to feed pt lunch. Pt lunch consists of roast beef, potato, green beans and fruit- pt tried it all but refused to eat it. No other needs voiced at this time.

## 2023-05-09 DIAGNOSIS — E041 Nontoxic single thyroid nodule: Secondary | ICD-10-CM | POA: Diagnosis not present

## 2023-05-09 DIAGNOSIS — N390 Urinary tract infection, site not specified: Secondary | ICD-10-CM | POA: Diagnosis not present

## 2023-05-09 MED ORDER — ACETAMINOPHEN 325 MG PO TABS
650.0000 mg | ORAL_TABLET | Freq: Once | ORAL | Status: AC
  Administered 2023-05-09: 650 mg via ORAL
  Filled 2023-05-09: qty 2

## 2023-05-09 NOTE — ED Notes (Signed)
Pt taking brief and gown off throwing them into floor. Pt yelling "take me to heaven" Pt keeps throwing off blankets off into floor and playing in anus.

## 2023-05-09 NOTE — ED Notes (Signed)
Pt complaining of tooth/mouth pain, new orders for PO tylenol recommended, see MAR.

## 2023-05-09 NOTE — TOC Progression Note (Signed)
Transition of Care [facility]) - Progression Note    Patient Details  Name: Janet Murphy MRN: [id number] Date of Birth: 1956-12-06  Transition of Care [facility]) CM/SW Contact  Darleene Cleaver, [place] Phone Number: 05/09/2023, 5:06 PM  Clinical Narrative:     CSW received an email from [employer] DSS worker.  She is requesting psych to see patient again to see if they can provide a medication to help decrease the amount of behaviors she is having with smearing her feces.  TOC updated bedside nurse and attending physician.  Patient unable to find placement due to her behavior with feces and also being Medicaid.  TOC to continue to follow patient's progress throughout discharge planning.  Expected Discharge Plan: Memory Care Barriers to Discharge: Continued Medical Work up  Expected Discharge Plan and Services   Discharge Planning Services: CM Consult   Living arrangements for the past 2 months: Single Family Home                 DME Arranged: N/A DME Agency: NA                   Social Determinants of Health (SDOH) Interventions SDOH Screenings   Tobacco Use: High Risk (02/08/2023)    Readmission Risk Interventions     No data to display

## 2023-05-09 NOTE — ED Notes (Signed)
Pt urinated in bed. Pt cleaned and a brief placed on pt.Pt then threw new brief on floor. Bed linens and chucks pad also changed.

## 2023-05-09 NOTE — ED Provider Notes (Signed)
Emergency Medicine Observation Re-evaluation Note  Janet Murphy is a 67 y.o. female, seen on rounds today.  Pt initially presented to the ED for complaints of Mental Health Problem  Currently, the patient is resting in bed. No reported issues overnight from nursing team.   Physical Exam  BP (!) 119/92 (BP Location: Right Arm)   Pulse 65   Temp 98.7 F (37.1 C) (Axillary)   Resp 19   SpO2 96%  Physical Exam General: Resting comfortably in bed  ED Course / MDM  EKG:   I have reviewed the labs and medications over the past 24 hours. Recent changes in the last 24 hours are none.   Plan  Current plan is for dispo per social work.    Trinna Post, MD 05/09/23 [phone]

## 2023-05-09 NOTE — ED Notes (Signed)
Pt didn't want food tray and pushed it into floor

## 2023-05-10 NOTE — ED Notes (Signed)
Pt's bedding changed due to urinary incontinence.  Pt up with assistance and diaper changed.  Sitter with pt.

## 2023-05-10 NOTE — ED Notes (Signed)
Pt has been given a peri bath by previous NT KN. Pt has been left with clean lien,cover,and gown. Pt is now resting in bed with NT at bedside.

## 2023-05-10 NOTE — ED Notes (Signed)
Resumed care from jon rn.  Pt alert.  Pt refusing vital signs at this time, pt states no.  Pt also states lights off, time for sleep.  Sitter with pt.

## 2023-05-10 NOTE — ED Notes (Signed)
Pt soiled the bed, this EDT and RN Amy changed the bed linens and place a clean chuck on bed. Pt has on a clean brief and gown with two warm blankets. Pt is laying in the bed looking at the ceiling saying "lord watch over me tonight". EDT at bedside.

## 2023-05-10 NOTE — ED Notes (Signed)
Patient wet bed and had bm  and wiped it on bed and liens and bedside table .

## 2023-05-10 NOTE — ED Notes (Signed)
Patient wet the bed and threw bed liens and bed chucks in the floor , patient now cleaned up and new liens applied and clean gown placed on with  dry bed chucks.

## 2023-05-10 NOTE — ED Notes (Signed)
Pt complains tooth hurts. NT applied oral-gel to pt's tooth.

## 2023-05-10 NOTE — ED Notes (Signed)
Alert, NAD, calm, amiable, watching TV, cooperative with meds and VS.

## 2023-05-11 DIAGNOSIS — N390 Urinary tract infection, site not specified: Secondary | ICD-10-CM | POA: Diagnosis not present

## 2023-05-11 DIAGNOSIS — E041 Nontoxic single thyroid nodule: Secondary | ICD-10-CM | POA: Diagnosis not present

## 2023-05-11 MED ORDER — NYSTATIN 100000 UNIT/GM EX POWD
Freq: Two times a day (BID) | CUTANEOUS | Status: DC
  Administered 2023-05-30: 1 via TOPICAL
  Filled 2023-05-11 (×3): qty 15

## 2023-05-11 NOTE — TOC Progression Note (Addendum)
Transition of Care [facility]) - Progression Note    Patient Details  Name: Janet Murphy MRN: [id number] Date of Birth: 1956/10/13  Transition of Care [facility]) CM/SW Contact  Darleene Cleaver, [place] Phone Number: 05/11/2023, 4:56 PM  Clinical Narrative:     CSW was informed by DSS legal guardian Thayer Ohm, that patient's husband was upset because patient has been complaining about tooth pain for several days and a dentist has not seen patient.  TOC sent an email to ED nursing director and assistant director to find out if there is a contracted dentist that can see patient.  TOC awaiting for a response back.  TOC informed bedside nurse that patient has been complaining about her tooth and asked if there is a dentist that the hospital uses.  She did not know of any and neither did the [facility].    CSW updated attending physician to see if he can examine patient to see if she has an infection.  CSW to update DSS legal guardian via email.  Per DSS, still no facilities able to accept patient with her Medicaid.  TOC continuing to follow patient's progress throughout discharge planning.    Expected Discharge Plan: Memory Care Barriers to Discharge: Continued Medical Work up  Expected Discharge Plan and Services   Discharge Planning Services: CM Consult   Living arrangements for the past 2 months: Single Family Home                 DME Arranged: N/A DME Agency: NA                   Social Determinants of Health (SDOH) Interventions SDOH Screenings   Tobacco Use: High Risk (02/08/2023)    Readmission Risk Interventions     No data to display

## 2023-05-11 NOTE — ED Provider Notes (Signed)
Procedures     ----------------------------------------- 6:21 PM on 05/11/2023 ----------------------------------------- Pt re-examined due to report of dental pain since 04/28/23.  No visible swelling or drainage, no tenderness along the maxilla or mandible.  Voice is clear. Appropriate for outpatient dentistry follow up.     Sharman Cheek, MD 05/11/23 Rickey Primus

## 2023-05-11 NOTE — ED Notes (Signed)
Patient up to bathroom at this time but no results .

## 2023-05-11 NOTE — ED Notes (Signed)
This RN was informed by Luther Parody regarding the patients yeast development under her RN breast. EDP notified - see MAR for intervention.

## 2023-05-11 NOTE — ED Notes (Signed)
Patient dried at this time. 

## 2023-05-11 NOTE — ED Notes (Signed)
Prescribed toothache ointment applied to pts toothache by this EDT due to pt repeating "My tooth hurts."

## 2023-05-11 NOTE — ED Notes (Signed)
Pt provided chocolate ice cream, applesauce, and a hospital snack tray, per request. Pt consumed the ice cream and half the applesauce, Pt was grabbing at snack box to get the [place] sandwich out of EDT's hands stating "I want that. I want that. I'm hungry." When handed the sandwich, pt then ripped it in half and refused to eat it, throwing it down on the bed.

## 2023-05-11 NOTE — ED Notes (Signed)
RN and 2 tech attempted to obtain vital signs. Pt would not hold still and began scratching staff.

## 2023-05-11 NOTE — ED Notes (Signed)
Pt refused vitals 

## 2023-05-11 NOTE — ED Notes (Signed)
Assumed care from [facility]. Pt resting comfortably in bed at this time. Pt sitter at bedside.

## 2023-05-11 NOTE — ED Notes (Signed)
This EDT attempted to obtain VS on pt. Pt uncooperative and forcefully ripping off all VS equipment immediately after it is put on. Pt unwilling to maintain a clean brief on her person. Pt currently has a hospital provided gown and nothing else. Pt getting up and out of bed often and goes towards the door as if to open it. Pt redirected by EDT. Pt also observed reaching both hands back and digging in her rectum, as reported to this EDT from the previous safety sitter. Pt able to be redirected and hands cleaned at this time. EDT safety sitter remains at bedside for monitoring.

## 2023-05-11 NOTE — ED Notes (Signed)
Family at bedside. 

## 2023-05-11 NOTE — ED Notes (Signed)
Pt had been guided to the toilet within the room due to pt expressing the need to defecate, but pt refused to use the toilet. Pt proceeded to get in her bed, sit down, pull the chux from beneath her, and pee all over the linens of the bed. Pt then stated "I peed the bed. Ew, gross. I peed the bed. I peed." Clean linens, chux, gown, and brief applied. Pt proceeded to rip off brief and throw it off the side of the bed momentarily after it was put on.  Pt also seen getting up out of bed, walking towards glass doors, looking out at the hallway, then getting back in bed. This occurred approximately 4-5 times per minute, followed by pt stating "I need to shit, I need to shit. I'm going to bed. Come here. I just peed the bed. Turn the tv on. Watch over me every night, Jesus. I'm going to bed."  EDT at bedside as Recruitment consultant.
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
## 2023-05-11 NOTE — ED Notes (Signed)
See sitters note regarding the patients behavior - due to increased agitation with proper de escalation tactics provided by the EDT without any improvement PRN medication administered.

## 2023-05-12 DIAGNOSIS — E041 Nontoxic single thyroid nodule: Secondary | ICD-10-CM | POA: Diagnosis not present

## 2023-05-12 DIAGNOSIS — N390 Urinary tract infection, site not specified: Secondary | ICD-10-CM | POA: Diagnosis not present

## 2023-05-12 MED ORDER — OLANZAPINE 5 MG PO TABS
5.0000 mg | ORAL_TABLET | Freq: Two times a day (BID) | ORAL | Status: DC
  Administered 2023-05-12 – 2023-06-22 (×81): 5 mg via ORAL
  Filled 2023-05-12 (×82): qty 1

## 2023-05-12 MED ORDER — ACETAMINOPHEN 500 MG PO TABS
1000.0000 mg | ORAL_TABLET | Freq: Three times a day (TID) | ORAL | Status: DC | PRN
  Administered 2023-05-12 – 2023-06-08 (×5): 1000 mg via ORAL
  Filled 2023-05-12 (×5): qty 2

## 2023-05-12 NOTE — ED Notes (Signed)
Attempted to obtain patient vitals. Patient refused and was guarding her arm from the blood pressure cuff and repeated several times "goodnight". Will attempt later.

## 2023-05-12 NOTE — ED Provider Notes (Signed)
Emergency Medicine Observation Re-evaluation Note  Janet Murphy is a 67 y.o. female, in the emergency department.  No acute events since last update.  Physical Exam  BP (!) 148/92 (BP Location: Right Arm)   Pulse 78   Temp 97.7 F (36.5 C) (Axillary)   Resp 20   SpO2 97%   ED Course / MDM   No recent lab work for review.  Plan  Current plan is for placement to an appropriate living facility once available.    Minna Antis, MD 05/12/23 [id number]

## 2023-05-12 NOTE — ED Notes (Signed)
Patient complaining of toothache patient could not give a pain score but patient states my tooth hurts really bad notified nurse Sydell Axon .

## 2023-05-12 NOTE — TOC Progression Note (Signed)
Transition of Care [facility]) - Progression Note    Patient Details  Name: Brittinay Sherrick MRN: [id number] Date of Birth: 1956-03-21  Transition of Care [facility]) CM/SW Contact  Darleene Cleaver, [place] Phone Number: 05/12/2023, 3:42 PM  Clinical Narrative:     CSW received an email from [place] at [facility], she is requesting updated clinicals to be emailed to her.  CSW emailed requested information to DSS.  Still does not have a Medicaid memory care facility.  Expected Discharge Plan: Memory Care Barriers to Discharge: Continued Medical Work up  Expected Discharge Plan and Services   Discharge Planning Services: CM Consult   Living arrangements for the past 2 months: Single Family Home                 DME Arranged: N/A DME Agency: NA                   Social Determinants of Health (SDOH) Interventions SDOH Screenings   Tobacco Use: High Risk (02/08/2023)    Readmission Risk Interventions     No data to display

## 2023-05-12 NOTE — ED Notes (Signed)
Pt removed brief and threw it on the floor. This tech put a new brief on pt at this time and ensured all other linen was clean and dry.

## 2023-05-12 NOTE — ED Notes (Signed)
Pt removed gown and brief and soiled the bed. NT tried redirecting pt was unsuccessful. Nt performed pericare and provide pt with clean gown, linen and brief.

## 2023-05-12 NOTE — ED Notes (Signed)
Attempted to obtain patient's vitals once again, and patient became agitated and combative. Gave patient warm blanket and some applesauce and she was able to calm down.

## 2023-05-12 NOTE — ED Notes (Signed)
Breakfast tray provided for pt. Pt did not want to eat at this time. Tray left at bedside.

## 2023-05-13 DIAGNOSIS — E041 Nontoxic single thyroid nodule: Secondary | ICD-10-CM | POA: Diagnosis not present

## 2023-05-13 DIAGNOSIS — N390 Urinary tract infection, site not specified: Secondary | ICD-10-CM | POA: Diagnosis not present

## 2023-05-13 NOTE — ED Notes (Signed)
Pt had soiled self bed sheet, blankets, bed pad, and gown were disposed and replaced. Pt was cleaned up and new brief was placed

## 2023-05-13 NOTE — ED Notes (Signed)
Pt is resting comfortable

## 2023-05-13 NOTE — ED Notes (Signed)
Tried obtaining VS while pt is asleep due to previous issues with pt be uncooperative with VS. Pt woke up before obtaining, will try again later.

## 2023-05-13 NOTE — ED Provider Notes (Signed)
-----------------------------------------   4:20 AM on 05/13/2023 -----------------------------------------   Blood pressure (!) 148/92, pulse 78, temperature 97.7 F (36.5 C), temperature source Axillary, resp. rate 20, SpO2 97 %.  The patient is calm and cooperative at this time.  There have been no acute events since the last update.  Awaiting disposition plan from case management/social work.    Corena Herter, MD 05/13/23 [phone]

## 2023-05-13 NOTE — ED Notes (Signed)
Pt was taken to the toilet. Pt urinated, pt stood midway and wet down her legs. Pt was given a bath at the sink. NT and RN with pt.

## 2023-05-13 NOTE — ED Notes (Signed)
Attempted to feed pt. She spit out the chicken and ate one bite of macaroni and cheese. Pt states "No more"

## 2023-05-13 NOTE — ED Notes (Signed)
Pt pulling blankets and gown off and throwing them on the floor. Pt up to the bedside commode. Continent of urine. Pt back to bed. Refused gown but given new blankets. Sitter at bedside

## 2023-05-13 NOTE — ED Notes (Signed)
This RN and NT attempted to obtain pt vitals. Only able to take BP, pulse, and resp. Pt not compliant to take temp or pulse ox.

## 2023-05-13 NOTE — ED Notes (Signed)
Pt husband to bedside at this time. Husband offered pt chicken tenders that he brought. Pt stated "I don't feel good." When asked what is wrong pt stated "my tooth hurts." RN made aware.

## 2023-05-13 NOTE — ED Notes (Signed)
Pt ate a bite of a banana at this time and then stated "I don't want that." And then placed banana on bedside table with other food. This NT continues to encourage food and beverages.

## 2023-05-13 NOTE — ED Notes (Signed)
This NT noticed that Pt bedding was wet because of urination. This NT changed lien, blankets and provided PT with new brief.This NT gave patient a pack of gram crackers and water. Pt is now resting in bed while NT is at bedside.

## 2023-05-13 NOTE — ED Notes (Signed)
Pt woke up stating she is cold. Pt refuses to keep clothes on. Two warm blanket placed on pt at this time. This tech sitting at bedside.

## 2023-05-13 NOTE — ED Notes (Signed)
This NT has prepared pt food tray at this time. Pt refuses to eat stating "I don't want that." repeatedly. Pt consumed 50% of beverage provided.

## 2023-05-13 NOTE — ED Notes (Signed)
Pt is up. Pt has had her Ensure and ate a banana for breakfast. Pt resting comfortably in bed.

## 2023-05-14 DIAGNOSIS — N390 Urinary tract infection, site not specified: Secondary | ICD-10-CM | POA: Diagnosis not present

## 2023-05-14 DIAGNOSIS — E041 Nontoxic single thyroid nodule: Secondary | ICD-10-CM | POA: Diagnosis not present

## 2023-05-14 NOTE — ED Provider Notes (Signed)
-----------------------------------------   7:03 AM on 05/14/2023 -----------------------------------------   Blood pressure (!) 131/108, pulse 87, temperature 98.2 F (36.8 C), temperature source Axillary, resp. rate 15, SpO2 97 %.  The patient is calm and cooperative at this time.  There have been no acute events since the last update.  Awaiting disposition plan from [facility] team.   Loleta Rose, MD 05/14/23 [phone]

## 2023-05-14 NOTE — ED Notes (Signed)
Dietary called to send ensure, fruit and pudding

## 2023-05-15 NOTE — ED Notes (Signed)
Pt provided graham crackers and sips of water per request.

## 2023-05-15 NOTE — ED Notes (Signed)
Pt repeating "I'm hungry. I want coffee. I'm hungry." Pt given a few bites of graham crackers and sips of water.

## 2023-05-15 NOTE — ED Notes (Signed)
Pt refusing vital signs at this time, will try again later.

## 2023-05-15 NOTE — ED Notes (Signed)
Bed bath given by Byrd Hesselbach with assistance from [place].

## 2023-05-15 NOTE — ED Notes (Addendum)
Pt provided more graham crackers and sips of water due to pt complaining of being "hungry".

## 2023-05-15 NOTE — ED Notes (Signed)
Pt continues to refuse vitals, will inform next shift.

## 2023-05-16 DIAGNOSIS — N390 Urinary tract infection, site not specified: Secondary | ICD-10-CM | POA: Diagnosis not present

## 2023-05-16 DIAGNOSIS — E041 Nontoxic single thyroid nodule: Secondary | ICD-10-CM | POA: Diagnosis not present

## 2023-05-16 NOTE — ED Notes (Signed)
Dietary contacted for lunch delivery  

## 2023-05-16 NOTE — ED Provider Notes (Signed)
-----------------------------------------   7:55 AM on 05/16/2023 -----------------------------------------   Blood pressure (!) 170/93, pulse (!) 120, temperature 97.7 F (36.5 C), temperature source Axillary, resp. rate 16, SpO2 100 %.  The patient is calm and cooperative at this time.  There have been no acute events since the last update.  Awaiting disposition plan from [facility] team.   Loleta Rose, MD 05/16/23 [phone]

## 2023-05-16 NOTE — ED Notes (Signed)
Pt increasingly agitated. Repeatedly taking off clothes and brief.

## 2023-05-16 NOTE — ED Notes (Signed)
Lunch tray delivered.

## 2023-05-16 NOTE — ED Notes (Signed)
Pt not cooperative with VS. 

## 2023-05-17 LAB — CBC WITH DIFFERENTIAL/PLATELET
Abs Immature Granulocytes: 0.03 10*3/uL (ref 0.00–0.07)
Basophils Absolute: 0.1 10*3/uL (ref 0.0–0.1)
Basophils Relative: 1 %
Eosinophils Absolute: 0.1 10*3/uL (ref 0.0–0.5)
Eosinophils Relative: 1 %
HCT: 39.3 % (ref 36.0–46.0)
Hemoglobin: 13.2 g/dL (ref 12.0–15.0)
Immature Granulocytes: 0 %
Lymphocytes Relative: 33 %
Lymphs Abs: 2.4 10*3/uL (ref 0.7–4.0)
MCH: 30.3 pg (ref 26.0–34.0)
MCHC: 33.6 g/dL (ref 30.0–36.0)
MCV: 90.1 fL (ref 80.0–100.0)
Monocytes Absolute: 0.5 10*3/uL (ref 0.1–1.0)
Monocytes Relative: 7 %
Neutro Abs: 4.3 10*3/uL (ref 1.7–7.7)
Neutrophils Relative %: 58 %
Platelets: 329 10*3/uL (ref 150–400)
RBC: 4.36 MIL/uL (ref 3.87–5.11)
RDW: 11.7 % (ref 11.5–15.5)
WBC: 7.4 10*3/uL (ref 4.0–10.5)
nRBC: 0 % (ref 0.0–0.2)

## 2023-05-17 LAB — BASIC METABOLIC PANEL
Anion gap: 7 (ref 5–15)
BUN: 23 mg/dL (ref 8–23)
CO2: 25 mmol/L (ref 22–32)
Calcium: 8.9 mg/dL (ref 8.9–10.3)
Chloride: 107 mmol/L (ref 98–111)
Creatinine, Ser: 0.69 mg/dL (ref 0.44–1.00)
GFR, Estimated: >60 mL/min (ref >60)
Glucose, Bld: 106 mg/dL — ABNORMAL HIGH (ref 70–99)
Potassium: 4.1 mmol/L (ref 3.5–5.1)
Sodium: 139 mmol/L (ref 135–145)

## 2023-05-17 NOTE — ED Notes (Signed)
Pt has increased agitation and is throwing things at the safety sitter, see MAR for intervention.

## 2023-05-17 NOTE — ED Notes (Signed)
Husband at bedside visiting with patient.

## 2023-05-17 NOTE — ED Notes (Signed)
Re-called lab to request a phlebotomist to come draw labs.

## 2023-05-17 NOTE — TOC Progression Note (Signed)
Transition of Care [facility]) - Progression Note    Patient Details  Name: Janet Murphy MRN: [id number] Date of Birth: 1956-09-01  Transition of Care [facility]) CM/SW Contact  Darleene Cleaver, [place] Phone Number: 05/17/2023, 11:14 AM  Clinical Narrative:    No memory care facility beds yet.   Expected Discharge Plan: Memory Care Barriers to Discharge: Continued Medical Work up  Expected Discharge Plan and Services   Discharge Planning Services: CM Consult   Living arrangements for the past 2 months: Single Family Home                 DME Arranged: N/A DME Agency: NA                   Social Determinants of Health (SDOH) Interventions SDOH Screenings   Tobacco Use: High Risk (02/08/2023)    Readmission Risk Interventions     No data to display

## 2023-05-17 NOTE — ED Notes (Signed)
Called lab to send phlebotomist for blood draw.

## 2023-05-18 NOTE — ED Notes (Signed)
PT refused vitals at this time. Agitated requesting TV to be turned off. Her roommate wants it on.

## 2023-05-18 NOTE — ED Notes (Signed)
Pt urinated in the bed this NT and NT Mayra cleaned up pt and applied new sheets and new gown.

## 2023-05-18 NOTE — ED Notes (Signed)
Pt is trying to sleep but can not because room mate keeps talking and trying to get out of bed.

## 2023-05-18 NOTE — ED Notes (Signed)
Pt took gown off and put chux pad with urine on it off her bed onto the floor. This tech changed pts gown and put a dry chux under pt.

## 2023-05-18 NOTE — ED Notes (Signed)
Pt is trying to sleep but can not because room mate has talked non stop since I took over sitting with both pts at 1900.

## 2023-05-18 NOTE — ED Notes (Signed)
Pt constantly trying to get out of be will addressing other PT in the room .  

## 2023-05-19 DIAGNOSIS — E041 Nontoxic single thyroid nodule: Secondary | ICD-10-CM | POA: Diagnosis not present

## 2023-05-19 DIAGNOSIS — N390 Urinary tract infection, site not specified: Secondary | ICD-10-CM | POA: Diagnosis not present

## 2023-05-19 NOTE — ED Notes (Addendum)
RN cleaned of incontinence. Pt bed linens changed and gown. RN attempted to get vital signs, pt would not remain still.

## 2023-05-19 NOTE — ED Provider Notes (Signed)
Emergency Medicine Observation Re-evaluation Note  Janet Murphy is a 67 y.o. female, seen on rounds today.  Pt initially presented to the ED for complaints of Mental Health Problem  Currently, the patient is calm, no acute complaints.  Physical Exam  Blood pressure (!) 146/78, pulse 79, temperature 98.2 F (36.8 C), temperature source Axillary, resp. rate 18, SpO2 96 %. Physical Exam General: NAD Lungs: CTAB Psych: not agitated  ED Course / MDM  EKG:    I have reviewed the labs performed to date as well as medications administered while in observation.  Recent changes in the last 24 hours include no acute events overnight.    Plan  Current plan is for Janet Murphy placement   Sharman Cheek, MD 05/19/23 [phone]

## 2023-05-19 NOTE — ED Notes (Signed)
Pt's hair combed and pt is now resting in bed.

## 2023-05-19 NOTE — ED Notes (Signed)
Pt ambulated to restroom. Pt voided and returned to bed.

## 2023-05-19 NOTE — ED Notes (Signed)
Pt was given a wash-up and sheets were changed. EDT [place] and [place] cleaned under pt's fingernails.

## 2023-05-19 NOTE — ED Notes (Signed)
Pt cleaned up and given clean gown. Clean sheets placed bed and pads placed underneath sheet. Pt resting at this time.

## 2023-05-19 NOTE — ED Notes (Signed)
Pt refused vital signs and attempting to get out of bed. Pt will not hold still to obtain VS.

## 2023-05-19 NOTE — ED Provider Notes (Signed)
Emergency Medicine Observation Re-evaluation Note  Janet Murphy is a 67 y.o. female, seen on rounds today.  Pt initially presented to the ED for complaints of Mental Health Problem  Currently, the patient is awake, laying in bed. No reported issues from nursing team.   Physical Exam  BP (!) 146/78   Pulse 79   Temp 98.2 F (36.8 C) (Axillary)   Resp 18   SpO2 96%  Physical Exam General: Laying comfortably in bed  ED Course / MDM  EKG:   I have reviewed the labs and medications over the past 24 hours. Recent changes in the last 24 hours are none.    Plan  Current plan is for dispo per social work.    Trinna Post, MD 05/19/23 [phone]

## 2023-05-19 NOTE — ED Notes (Signed)
Assumed care from [facility]. Pt in bed. Safety sitter at bedside.

## 2023-05-20 DIAGNOSIS — E041 Nontoxic single thyroid nodule: Secondary | ICD-10-CM | POA: Diagnosis not present

## 2023-05-20 DIAGNOSIS — N390 Urinary tract infection, site not specified: Secondary | ICD-10-CM | POA: Diagnosis not present

## 2023-05-20 NOTE — ED Notes (Signed)
Pt continuously takes off brief and gown. This tech tries to put both back on pt, but she takes them off again.

## 2023-05-20 NOTE — ED Notes (Signed)
[facility] called for ensure, none available at this time.

## 2023-05-20 NOTE — ED Notes (Signed)
Pt has remote to the TV for the room, pt keeps turning the volume up very loud that. This tech turns the volume down manually by the buttons on the Tv, pt then uses the remote to turn the volume back up. Pt will not let this tech have the remote.   Pt also removes chux pad from underneath her and throws it on the floor. This tech attempts to place a chux pad underneath her, pt will not allow.

## 2023-05-20 NOTE — TOC Progression Note (Signed)
Transition of Care [facility]) - Progression Note    Patient Details  Name: Janet Murphy MRN: [id number] Date of Birth: 1956/12/11  Transition of Care [facility]) CM/SW Contact  Darleene Cleaver, [place] Phone Number: 05/20/2023, 9:17 PM  Clinical Narrative:     Still no memory care Medicaid beds available, DSS legal guardian.   Expected Discharge Plan: Memory Care Barriers to Discharge: Continued Medical Work up  Expected Discharge Plan and Services   Discharge Planning Services: CM Consult   Living arrangements for the past 2 months: Single Family Home                 DME Arranged: N/A DME Agency: NA                   Social Determinants of Health (SDOH) Interventions SDOH Screenings   Tobacco Use: High Risk (02/08/2023)    Readmission Risk Interventions     No data to display

## 2023-05-20 NOTE — ED Notes (Signed)
Brief and gown placed on pt at this time. 

## 2023-05-20 NOTE — ED Notes (Signed)
Pt repeatedly yelling out "hey jesus, hey God". Pt still refusing to keep gown or brief on. RN notified.

## 2023-05-20 NOTE — ED Notes (Signed)
Attempted to get pt's vitals. Pt took off bp cuff and pulse oximeter. Pt also spit out thermometer probe.

## 2023-05-20 NOTE — ED Notes (Signed)
Gave pt her dinner tray. Pt said "ew ew ew" and handed it back to this tech. Will see if pt wants dinner later.

## 2023-05-20 NOTE — ED Notes (Signed)
Pt ambulated to restroom. Pt voided and returned to bed. 

## 2023-05-20 NOTE — ED Provider Notes (Signed)
-----------------------------------------   6:18 AM on 05/20/2023 -----------------------------------------   Blood pressure (!) 146/78, pulse 79, temperature 98.2 F (36.8 C), temperature source Axillary, resp. rate 18, SpO2 96 %.  The patient is calm and cooperative at this time.  There have been no acute events since the last update.  Awaiting disposition plan from Social Work team.   Irean Hong, MD 05/20/23 [phone]

## 2023-05-20 NOTE — ED Notes (Signed)
Pt drank 100% of ensure  

## 2023-05-20 NOTE — ED Notes (Signed)
Pt yelling in room and not able to be redirected. Pt continues to get up erratically out of bed.

## 2023-05-20 NOTE — ED Notes (Signed)
Pt only ate 1 banana for breakfast. Pt appears to be looking at her tray wanting more food, when this tech shows pt the food, pt states "ew ew ew" and sticks her tongue out and moves the tray away.

## 2023-05-20 NOTE — ED Notes (Signed)
Pt refusing to keep gown or brief on at this time. Pt throwing brief across room.

## 2023-05-20 NOTE — ED Notes (Signed)
Pt ambulated to restroom. Pt voided, had a BM, and returned to bed.

## 2023-05-21 NOTE — ED Notes (Addendum)
Pt drank 100% of ensure, did not appear to want the rest of her breakfast at this time

## 2023-05-21 NOTE — ED Notes (Signed)
Pt appears to be resting in bed with eyes closed. Rise and fall of chest noted. Sitter remains at bedside.

## 2023-05-21 NOTE — ED Notes (Signed)
Assumed care from Abbey,RN. Pt in bed, safety sitter at bedside.  

## 2023-05-21 NOTE — ED Notes (Signed)
Pt up to the bathroom at this time. Pt did not remain seated while voiding and voided on the floor. Peri care provided. New chux placed. New gown donned.

## 2023-05-21 NOTE — ED Notes (Signed)
Pt consumed 100% of pudding and beverage at this time. Pt refused meal tray. Pt did not receive ensure. RN notified.

## 2023-05-21 NOTE — ED Notes (Addendum)
Pt resting comfortably at this time. Pt sleeping. Rise and fall of chest noted at this time.

## 2023-05-21 NOTE — ED Notes (Signed)
Pt voided in bed at this time. With help of NT Keke, linens changed, chux pad applied, new gown donned. Pt toileted but did not void.

## 2023-05-21 NOTE — ED Notes (Addendum)
Assumed care of pt at this time from sitter [facility]. Pt sleeping. Rise and fall of chest noted at this time.

## 2023-05-21 NOTE — ED Notes (Addendum)
Pt showered at this time with the help of NT [facility]. Peri care preformed. Clean gown donned. Pt returned to bed ad resting comfortably with chest rise and fall noted.

## 2023-05-22 NOTE — ED Notes (Signed)
This Rn called down to dietary requesting an ensure for this patient. Informed that dietary is extremely behind.

## 2023-05-22 NOTE — ED Notes (Signed)
This Rn received a secure chat from the safety sitter that states that the patient is agitated and trying to get out of her bed, as her roommate is also having agitation as well.

## 2023-05-22 NOTE — ED Notes (Signed)
Never received ensure from dietary. Charge RN Erskine Squibb notified.

## 2023-05-22 NOTE — ED Notes (Signed)
Patient's bedding soiled in urine. Pt initially refused to have bedding changed per sitter. This RN and sitter able to change patients bedding and patient placed in clean gown.

## 2023-05-23 NOTE — ED Notes (Signed)
Pt appears to be comfortable and resting, can observe even RR that are unlabored, sitter remains at bedside for safety, side rails up x2 for fall prevention, NAD noted.

## 2023-05-23 NOTE — ED Notes (Signed)
Patient would not allow vital signs to be taken at this time.

## 2023-05-23 NOTE — ED Notes (Signed)
Pt appears to be comfortable and resting, can observe even RR that are unlabored, no changes noted, side rails up x2 for safety, Safety sitter remains at bedside, lights on dim to promote rest, NAD noted, no further concerns as of present.   

## 2023-05-24 DIAGNOSIS — E041 Nontoxic single thyroid nodule: Secondary | ICD-10-CM | POA: Diagnosis not present

## 2023-05-24 DIAGNOSIS — N390 Urinary tract infection, site not specified: Secondary | ICD-10-CM | POA: Diagnosis not present

## 2023-05-24 NOTE — ED Notes (Signed)
Patient resting in bed with eyes closed, appears sleeping. Resp even, unlabored on RA. Safety sitter at bedside. No distress noted.
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
## 2023-05-24 NOTE — ED Notes (Signed)
Pt agitated. Pt throwing gown and blankets on floor. Pt refusing to stay still.

## 2023-05-24 NOTE — ED Provider Notes (Signed)
-----------------------------------------   6:16 AM on 05/24/2023 -----------------------------------------   Blood pressure (!) 156/134, pulse 84, temperature (!) 97.5 F (36.4 C), temperature source Oral, resp. rate 19, SpO2 95 %.  The patient is calm and cooperative at this time.  There have been no acute events since the last update.  Awaiting disposition plan from [facility] team.   Loleta Rose, MD 05/24/23 [phone]

## 2023-05-25 DIAGNOSIS — N390 Urinary tract infection, site not specified: Secondary | ICD-10-CM | POA: Diagnosis not present

## 2023-05-25 DIAGNOSIS — E041 Nontoxic single thyroid nodule: Secondary | ICD-10-CM | POA: Diagnosis not present

## 2023-05-25 NOTE — ED Notes (Signed)
Pt uncooperative. Unable to get vital signs at this time.

## 2023-05-25 NOTE — ED Notes (Signed)
Pt drank 100% of ensure  

## 2023-05-25 NOTE — ED Provider Notes (Signed)
Emergency Medicine Observation Re-evaluation Note  Janet Murphy is a 66 y.o. female, seen on rounds today.  Pt initially presented to the ED for complaints of Mental Health Problem  Currently, the patient is awake, resting in bed. No reported issues from nursing team.   Physical Exam  BP (!) 140/76   Pulse 77   Temp (!) 97.5 F (36.4 C) (Axillary)   Resp 20   SpO2 96%  Physical Exam General: Resting comfortably in bed  ED Course / MDM  EKG:   Plan  Current plan is for TOC disposition.     Trinna Post, MD 05/25/23 [phone]

## 2023-05-25 NOTE — ED Notes (Signed)
Pt is agitated that her roommate is getting up and walking around the room. Pt almost kicked the other pt.

## 2023-05-25 NOTE — ED Notes (Signed)
Pt ate 25% of breakfast. 

## 2023-05-25 NOTE — ED Notes (Signed)
Pt woke up covered in poop this tech cleaned up Pt and gave bedside bath. New sheets and gown given. Pt sitting up in bed eating.  No other needs ar this time.

## 2023-05-25 NOTE — ED Notes (Signed)
Husband at bedside with patient.

## 2023-05-25 NOTE — ED Notes (Signed)
Pt refused lunch tray, only ate a pack of graham crackers and 2 cookies on tray

## 2023-05-25 NOTE — ED Notes (Signed)
Patient resting in bed with eyes closed. Resp even, unlabored on RA. No distress noted at this time. Safety sitter at bedside.

## 2023-05-26 DIAGNOSIS — E041 Nontoxic single thyroid nodule: Secondary | ICD-10-CM | POA: Diagnosis not present

## 2023-05-26 DIAGNOSIS — N390 Urinary tract infection, site not specified: Secondary | ICD-10-CM | POA: Diagnosis not present

## 2023-05-26 NOTE — ED Notes (Signed)
Patient refused her lunch  

## 2023-05-26 NOTE — ED Notes (Signed)
Pt received meal tray at this time. Refused meal tray at this time.

## 2023-05-26 NOTE — ED Notes (Signed)
Pt given a cup of water and a pack of graham crackers at this time.

## 2023-05-26 NOTE — ED Notes (Signed)
Pt urinated in bed. Pt cleaned and a brief and gown placed on pt. Bed linens also changed at this time and pt provided with warm blankets.

## 2023-05-26 NOTE — ED Notes (Signed)
Patient is sleeping. RR even and unlabored

## 2023-05-26 NOTE — ED Notes (Signed)
Patient sleeping. RR even and unlabored.

## 2023-05-26 NOTE — ED Notes (Signed)
Assumed care of pt. Per report, pt medicated in the AM and is currently sleeping. Pts respirations are even and unlabored. Sitter at bedside. VS will be obtained when pt wakes.

## 2023-05-26 NOTE — ED Notes (Signed)
Patient sleeping at this time.

## 2023-05-26 NOTE — ED Provider Notes (Signed)
Emergency Medicine Observation Re-evaluation Note  Janet Murphy is a 67 y.o. female, seen on rounds today.  Pt initially presented to the ED for complaints of Mental Health Problem  Currently, the patient is awake, laying in bed. No reported issues from nursing team.   Physical Exam  BP (!) 140/76   Pulse 77   Temp (!) 97.5 F (36.4 C) (Axillary)   Resp 20   SpO2 96%  Physical Exam General: Laying comfortably in bed  ED Course / MDM  EKG:   Plan  Current plan is for dispo per social work.    Trinna Post, MD 05/26/23 [phone]

## 2023-05-26 NOTE — TOC Progression Note (Signed)
Transition of Care [facility]) - Progression Note    Patient Details  Name: Janet Murphy MRN: [id number] Date of Birth: 08/28/1956  [facility] CM/SW Contact  Darleene Cleaver, [place] Phone Number: 05/25/2023, 1:00pm  Clinical Narrative:     CSW spoke to Tammy at [facility] again to see if they will consider patient, and she said no due to her smearing feces.  CSW spoke to Dixie at [facility], and he has added her to the wait list for a Medicaid bed.  TOC to continue to follow, DSS is legal guardian and they are still working on trying to find placement.   Expected Discharge Plan: Memory Care Barriers to Discharge: Continued Medical Work up  Expected Discharge Plan and Services   Discharge Planning Services: CM Consult   Living arrangements for the past 2 months: Single Family Home                 DME Arranged: N/A DME Agency: NA                   Social Determinants of Health (SDOH) Interventions SDOH Screenings   Tobacco Use: High Risk (02/08/2023)    Readmission Risk Interventions     No data to display

## 2023-05-26 NOTE — ED Provider Notes (Signed)
-----------------------------------------   5:10 AM on 05/26/2023 -----------------------------------------   Blood pressure (!) 140/76, pulse 77, temperature (!) 97.5 F (36.4 C), temperature source Axillary, resp. rate 20, SpO2 96 %.  The patient is calm and cooperative at this time.  There have been no acute events since the last update.  Awaiting disposition plan from Social Work team.   Irean Hong, MD 05/26/23 [phone]

## 2023-05-26 NOTE — ED Notes (Signed)
Pt removed brief, stated "I'm peeing", and urinated in bed. Pt cleaned and a clean brief placed on pt. Bed linens also changed at this time.

## 2023-05-27 NOTE — ED Notes (Signed)
Pt has taken brief off and thrown it in the floor. Pt proceeded to pee on the bed. New chuck placed. Pt refused peri care.

## 2023-05-27 NOTE — ED Notes (Signed)
Bed sheets and chux pad changed. Pt given cup of cola, pt very thankful for this. Pt helped this tech place brief on herself and helped place chux pad as well. Pt given new blankets and is currently resting with eyes closed, opening them and fixing her pillow intermittently.

## 2023-05-27 NOTE — TOC Progression Note (Signed)
Transition of Care [facility]) - Progression Note    Patient Details  Name: Janet Murphy MRN: [id number] Date of Birth: [birth year]  Transition [facility] Care [facility]) CM/SW Contact  Darleene Cleaver, [place] Phone Number: 05/27/2023, 6:09 PM  Clinical Narrative:     Sent referral to [facility] to review if they will consider patient for their dementia SNF.  Waiting for decision, emailed DSS to see if they have any updates on placement.   Expected Discharge Plan: Memory Care Barriers to Discharge: Continued Medical Work up  Expected Discharge Plan and Services   Discharge Planning Services: CM Consult   Living arrangements for the past 2 months: Single Family Home                 DME Arranged: N/A DME Agency: NA                   Social Determinants of Health (SDOH) Interventions SDOH Screenings   Tobacco Use: High Risk (02/08/2023)    Readmission Risk Interventions     No data to display

## 2023-05-27 NOTE — ED Notes (Signed)
Pt given breakfast tray

## 2023-05-27 NOTE — ED Notes (Signed)
Pt refused to eat anything on her breakfast tray. She asked form it and this EDT gave pt the plate. Pt looked at everything on the plate and said " eww take that away" . This EDT was able to get pt to eat a banana. Pt is currently laying in bed with EDT at bedside.

## 2023-05-27 NOTE — ED Notes (Signed)
Pt not compliant with VS

## 2023-05-27 NOTE — ED Notes (Signed)
Pt resting with eyes closed and reps unlabored. Equal rise and fall of chest noted.

## 2023-05-27 NOTE — ED Notes (Signed)
Pt ambulated independently to the bathroom to urinate. Peri care performed. Pt in bed at this time.

## 2023-05-27 NOTE — ED Notes (Signed)
NT attempted to take Temperature. Pt kept biting on the thermometer and taking it out of her mouth. No temp recorded at this time.

## 2023-05-28 DIAGNOSIS — E041 Nontoxic single thyroid nodule: Secondary | ICD-10-CM | POA: Diagnosis not present

## 2023-05-28 DIAGNOSIS — N390 Urinary tract infection, site not specified: Secondary | ICD-10-CM | POA: Diagnosis not present

## 2023-05-28 NOTE — ED Provider Notes (Signed)
-----------------------------------------   5:34 AM on 05/28/2023 -----------------------------------------   Blood pressure (!) 140/76, pulse 77, temperature 97.7 F (36.5 C), temperature source Axillary, resp. rate 20, SpO2 96 %.  The patient is calm and cooperative at this time.  There have been no acute events since the last update.  Awaiting disposition plan from Social Work team.   Irean Hong, MD 05/28/23 [phone]

## 2023-05-28 NOTE — ED Notes (Signed)
Pt received meal tray at this time. Pt consumed 50% of pudding offered. Pt refused remaining parts of meal tray.

## 2023-05-28 NOTE — ED Notes (Signed)
Pt wet the bed. Pt was cleaned up. Pt has a clean bottom sheet. Pt removed brief that was placed on her.

## 2023-05-28 NOTE — ED Notes (Signed)
Report given to oncoming NT. Pt sleeping at this time. Chest rise and fall noted.

## 2023-05-28 NOTE — ED Notes (Signed)
Patient given dinner tray. Patient ate one cup of peaches and a few bites of a dinner roll.

## 2023-05-28 NOTE — ED Notes (Signed)
Pt has removed brief at this time.

## 2023-05-28 NOTE — ED Notes (Signed)
Pt wet the bed. Pt was washed up and changed. Pt has clean bottom sheet and clean brief on.

## 2023-05-28 NOTE — ED Notes (Signed)
Attempted to get vitals on pt several times. Pt took cuff of several times. Will try again later.

## 2023-05-28 NOTE — ED Notes (Signed)
Patient's husband came to visit patient. Patient's husband brought patient some chocolate and Pepsi. Shortly after her husband left, the patient said "I feel sick". Patient proceeded to vomit on herself. This EDT along with other staff cleaned patient up. This EDT continues to sit and monitor patient at this time.

## 2023-05-28 NOTE — ED Notes (Signed)
Received report from NT [facility]. Pt sleeping at this time. Chest rise and fall noted.

## 2023-05-28 NOTE — ED Notes (Signed)
Pt is in bed resting comfortably.

## 2023-05-28 NOTE — ED Notes (Signed)
Pt sleeping at this time. Chest rise and fall noted.

## 2023-05-29 DIAGNOSIS — E041 Nontoxic single thyroid nodule: Secondary | ICD-10-CM | POA: Diagnosis not present

## 2023-05-29 DIAGNOSIS — N390 Urinary tract infection, site not specified: Secondary | ICD-10-CM | POA: Diagnosis not present

## 2023-05-29 NOTE — ED Notes (Signed)
Combed pt's hair and washed pt's hands and dressed pt in a gown. Pt drank ensure brought up by dietary.

## 2023-05-29 NOTE — ED Notes (Signed)
Pt got up to use the bathroom. Pt urinated.

## 2023-05-29 NOTE — ED Notes (Addendum)
Pt got up to use the bathroom. Pt urinated.

## 2023-05-29 NOTE — ED Notes (Signed)
Offered pt dinner and pt refused. Did not eat or drink anything from dinner tray.  No other needs voiced at this time.

## 2023-05-29 NOTE — ED Notes (Signed)
Dietary delivered breakfast tray. Will give to pt when they wake up.

## 2023-05-29 NOTE — ED Notes (Signed)
Pt ate banana off her breakfast tray and drank the orange juice. Pt refused to eat anything else. Will offer it again in a while to see if she will eat more.

## 2023-05-29 NOTE — ED Provider Notes (Signed)
Emergency Medicine Observation Re-evaluation Note  Physical Exam   BP (!) 140/76   Pulse 77   Temp 97.7 F (36.5 C) (Axillary)   Resp 20   SpO2 96%   Patient appears in no acute distress.  ED Course / MDM   No reported events during my shift at the time of this note.   Pt is awaiting dispo from SW   Pilar Jarvis MD    Pilar Jarvis, MD 05/29/23 [phone]

## 2023-05-29 NOTE — ED Notes (Addendum)
Pt up to the restroom multiple times; continues to refuse to wear a gown or brief. No other needs voiced at this time.

## 2023-05-29 NOTE — ED Notes (Signed)
Tech assisted pt to bathroom. Tech advised pt "dug " into her backside, and dug out poop and placed it in toilet

## 2023-05-29 NOTE — ED Notes (Signed)
Pt attempted to eat lunch. Pt spit food out. Pt had her tea and her fruit.

## 2023-05-29 NOTE — ED Notes (Signed)
This tech ending safety sitting.

## 2023-05-29 NOTE — ED Notes (Addendum)
PT up to toilet to void and back to bed.

## 2023-05-29 NOTE — ED Notes (Signed)
Pts husband at bedside and brought pt dinner from [place]. Pt refused dinner. Pts husband also brought cookies and pepsi- pt received that.   Husband left @1730  and no other needs voiced at this time.

## 2023-05-29 NOTE — ED Notes (Signed)
Pt refuses to keep gown on. Pt was instructed that she can't be naked since she has a room mate and must stay covered with a blanket if refusing to wear a gown.

## 2023-05-29 NOTE — ED Notes (Signed)
This tech took over safety sitting with pt. Pt is sleeping. Rise and fall of chest noted.

## 2023-05-30 DIAGNOSIS — N390 Urinary tract infection, site not specified: Secondary | ICD-10-CM | POA: Diagnosis not present

## 2023-05-30 DIAGNOSIS — E041 Nontoxic single thyroid nodule: Secondary | ICD-10-CM | POA: Diagnosis not present

## 2023-05-30 NOTE — ED Provider Notes (Signed)
-----------------------------------------   9:17 PM on 05/30/2023 -----------------------------------------   Blood pressure 127/84, pulse 78, temperature 98.1 F (36.7 C), temperature source Oral, resp. rate 18, SpO2 99 %.  The patient is calm and cooperative at this time.  There have been no acute events since the last update.  Awaiting disposition plan from Behavioral Medicine and/or Social Work team(s).    Dionne Bucy, MD 05/30/23 [phone]

## 2023-05-30 NOTE — ED Notes (Signed)
Husband at bedside with cookies, pepsi, and nutter butter.

## 2023-05-30 NOTE — ED Notes (Signed)
Patient lying in bed with eyes open. Resp even, unlabored on RA. States, "Take me to heaven. Take me to heaven." No distress noted. Sitter at bedside.

## 2023-05-30 NOTE — ED Notes (Addendum)
Pt with meal tray. Shows little interest in eating it. Dietary called to deliver Ensure.

## 2023-05-30 NOTE — TOC Progression Note (Signed)
Transition of Care [facility]) - Progression Note    Patient Details  Name: Janet Murphy MRN: [id number] Date of Birth: [birth year]  Transition of Care [facility]) CM/SW Contact  Darleene Cleaver, [place] Phone Number: 05/30/2023, 5:20 PM  Clinical Narrative:     CSW asked Milinda Pointer from DSS to see if [facility] will review patient again since her behaviors have decreased.  Expected Discharge Plan: Memory Care Barriers to Discharge: Continued Medical Work up  Expected Discharge Plan and Services   Discharge Planning Services: CM Consult   Living arrangements for the past 2 months: Single Family Home                 DME Arranged: N/A DME Agency: NA                   Social Determinants of Health (SDOH) Interventions SDOH Screenings   Tobacco Use: High Risk (02/08/2023)    Readmission Risk Interventions     No data to display

## 2023-05-31 NOTE — ED Notes (Signed)
Pt refusing to eat breakfast at this time. 

## 2023-05-31 NOTE — ED Notes (Signed)
Pt resting in bed at this time. Alert. NAD. Sitter at bedside. 

## 2023-05-31 NOTE — ED Notes (Signed)
Attempted to check patients vitals. When I asked the patient if I could please check her vitals she continues to say no. Patient continues to push my hands and equipment away. Patient currently resting in the bed with her eyes open.

## 2023-05-31 NOTE — ED Notes (Signed)
Patient resting in bed with eyes closed. Resp even, unlabored on RA. No distress noted. Sitter at bedside.

## 2023-05-31 NOTE — ED Notes (Signed)
Pt drank 100% of ensure  

## 2023-05-31 NOTE — ED Notes (Signed)
Pt was given lunch tray. Pt ate half of the mash potatoes and ate the cookies that came with the tray. Pt is currently laying in bed looking at TV with EDT at bedside.

## 2023-05-31 NOTE — ED Notes (Signed)
Pt urinated in the bed. This tech and Faith NT cleaned the pt, wiped down the bed, and put fresh linen/chuck on the bed. The pt put on a new gown on and is laying comfortably in the bed.

## 2023-05-31 NOTE — ED Notes (Signed)
pt drank a gingerale and graham crackers.

## 2023-05-31 NOTE — ED Notes (Signed)
Pt given dinner tray. Pt stated "ew ew ew". This tech put her dinner to the side and will see if she wants any food later.

## 2023-06-01 DIAGNOSIS — N390 Urinary tract infection, site not specified: Secondary | ICD-10-CM | POA: Diagnosis not present

## 2023-06-01 DIAGNOSIS — E041 Nontoxic single thyroid nodule: Secondary | ICD-10-CM | POA: Diagnosis not present

## 2023-06-01 NOTE — ED Provider Notes (Signed)
-----------------------------------------   6:12 AM on 06/01/2023 -----------------------------------------   Blood pressure 127/84, pulse 78, temperature 98.1 F (36.7 C), temperature source Oral, resp. rate 18, SpO2 99 %.  The patient is calm and cooperative at this time.  There have been no acute events since the last update.  Awaiting disposition plan from Social Work team.   Irean Hong, MD 06/01/23 [phone]

## 2023-06-01 NOTE — ED Notes (Signed)
Dietary contacted for ensure 

## 2023-06-01 NOTE — ED Notes (Signed)
Called dietary to send ensure up to give to patient.

## 2023-06-01 NOTE — ED Notes (Signed)
Patient given dinner tray at this time.

## 2023-06-01 NOTE — ED Notes (Signed)
Unable to obtain vital signs at this time due to pt refusal.

## 2023-06-01 NOTE — ED Notes (Signed)
Pt denied tray at this time. Will try at a later time

## 2023-06-01 NOTE — ED Notes (Signed)
This EDT and EDT Melanie cleaned up pt and was taken to the toilet. Pt then peed on the floor due to standing while using the bathroom. New linens and brief provided at this time. Warm blankets given. No other needs at this time

## 2023-06-01 NOTE — ED Notes (Signed)
Pt repeating "take me out of this hell hole". Pt continues to get louder each time

## 2023-06-01 NOTE — ED Notes (Signed)
Pt urinated in bed. Pt cleaned and a clean brief was placed on pt at this time by this tech and RN, [employer]. Chucks pads under pt also replaced at this time.

## 2023-06-01 NOTE — ED Notes (Signed)
Patient resting on bed at this time. Easily arousal with voice. Patient asked if she would like to take her night time medications. Patient states "no" and continued to close eyes. This nurse to return medications at this time.  Nad noted. Resp even and nonlabored. Skin pwd. Eyes closed. Chest rise and fall noted.

## 2023-06-01 NOTE — ED Notes (Signed)
This tech sitting 1:2 with Pt at this time

## 2023-06-01 NOTE — ED Notes (Signed)
Pt drank 100% of Ensure at this time

## 2023-06-02 DIAGNOSIS — N390 Urinary tract infection, site not specified: Secondary | ICD-10-CM | POA: Diagnosis not present

## 2023-06-02 DIAGNOSIS — E041 Nontoxic single thyroid nodule: Secondary | ICD-10-CM | POA: Diagnosis not present

## 2023-06-02 NOTE — ED Notes (Signed)
Pt ambulated independently to bathroom to urinate. Pt has returned to bed at this time.

## 2023-06-02 NOTE — ED Notes (Addendum)
Pt ambulatory to toilet with encouragement, writer done pericare due to be not being able to.

## 2023-06-02 NOTE — ED Notes (Signed)
Patient lying on stretcher with eyes closed. Chest rise and fall noted. Skin appears pink. Resp even and non labored. NAD noted at this time. Safety Sitter present at bedside.

## 2023-06-02 NOTE — ED Notes (Signed)
Pt ambulated to the bathroom. Pt back in bed.

## 2023-06-02 NOTE — ED Notes (Signed)
Lunch tray given. 

## 2023-06-02 NOTE — ED Notes (Addendum)
Patient lying on stretcher at this time. Eyes closed. Easily aroused.  Resp even and non labored. Chest rise and fall noted. Skin appears pink. Nad noted at this time. Patient had urinated self. This RN to help safety sitter to changing linen, brief, and gown. Nurse attempted to take vitals. Patient took off equipment almost immediately after placing items.

## 2023-06-02 NOTE — ED Notes (Signed)
Patient is lying in bed with eyes closed. Chest rise and fall noted. Skin appears pink at this time. Resp even and non labored. Nad noted at this time. Safety sitter at bedside.

## 2023-06-02 NOTE — ED Notes (Signed)
Pt ambulatory to toilet with encouragement, pericare done by Clinical research associate.

## 2023-06-02 NOTE — ED Notes (Addendum)
Patient lying on stretcher at this time with eyes closed. Chest rise and fall noted. Resp even and non labored.  Skin appears pink. Safety sitter at bedside at this time.  

## 2023-06-02 NOTE — ED Notes (Signed)
Pt urinated in bed. This NT assisted pt to bathroom and provided a full linen change and new gown. Pt in bed at this time.

## 2023-06-02 NOTE — ED Notes (Signed)
Pt in bed, pt calm and cooperative, pt took all morning medications without difficulty, pt denies pain, resps even and unlabored. Pt confused, attempted to re orient pt.

## 2023-06-02 NOTE — ED Notes (Signed)
Patient lying on stretcher at this time with eyes closed. Chest rise and fall noted. Resp even and non labored.  Skin appears pink. Nad noted. Safety sitter at bedside at this time. Patient lying on back at this time

## 2023-06-02 NOTE — ED Provider Notes (Signed)
    06/01/2023    6:21 AM 05/30/2023   11:36 AM 05/24/2023    7:42 PM  Vitals with BMI  Systolic 131 127 829  Diastolic 79 84 76  Pulse 81 78 77    There have been no acute events since last update.  Patient is awaiting disposition by social work team.   Georga Hacking, MD 06/02/23 [phone]

## 2023-06-02 NOTE — ED Notes (Signed)
Patient lying on stretcher at this time with eyes closed. Chest rise and fall noted. Resp even and non labored.  Skin appears pink. Safety sitter at bedside at this time.

## 2023-06-02 NOTE — ED Notes (Signed)
Pt ambulated independently to bathroom to urinate. Pt in bed at this time. Snack provided.

## 2023-06-02 NOTE — ED Notes (Signed)
Lunch tray provided to Pt. Pt ate fruit cup only, stated "I don't want that" several times regarding the meal tray. Pt also pushed table with lunch out of the way. Pt is resting at this time.

## 2023-06-02 NOTE — ED Notes (Signed)
Report given at this time. Pt sleeping. Chest rise and fall noted.

## 2023-06-02 NOTE — TOC Progression Note (Addendum)
Transition of Care [facility]) - Progression Note    Patient Details  Name: Krissia Schreier MRN: [id number] Date of Birth: 10-20-1956  Transition [facility] Care [facility]) CM/SW Contact  Darleene Cleaver, [place] Phone Number: 06/01/2023, 11:38am  Clinical Narrative:     CSW sent email to Pilar Jarvis at [facility], she is going to contact [employer] again to see if they will reevaluate patient for placement.  Patient's behaviors have decreased, and [facility] may be more agreeable to accepting patient in their memory care facility.  CSW to follow up with DSS to see if [facility] will evaluate patient again, updated clinicals were emailed to DSS to present to ALF.  Expected Discharge Plan: Memory Care Barriers to Discharge: Continued Medical Work up  Expected Discharge Plan and Services ALF with memory care.    Discharge Planning Services: CM Consult   Living arrangements for the past 2 months: Single Family Home                 DME Arranged: N/A DME Agency: NA                   Social Determinants of Health (SDOH) Interventions SDOH Screenings   Tobacco Use: High Risk (02/08/2023)    Readmission Risk Interventions     No data to display

## 2023-06-02 NOTE — ED Notes (Signed)
Pt in bed, pt resting comfortably

## 2023-06-02 NOTE — ED Notes (Signed)
Pt in bed, sitter at bedside,

## 2023-06-02 NOTE — ED Notes (Signed)
Pt became restless. This tech encouraged pt to use toilet in room. Pt ambulated to toilet, urinated and ambulated back to bed without complication. Pt resting comfortably at this time.

## 2023-06-02 NOTE — ED Notes (Signed)
Pt ambulated to bathroom. Pt back in bed at this time.

## 2023-06-02 NOTE — ED Notes (Signed)
Patient lying on stretcher with eyes closed, but easily aroused. Patient denied needs at this time. Skin pwd. Resp even and unlabored. Nad noted at this time. Safety sitter at bedside.

## 2023-06-02 NOTE — ED Notes (Signed)
Pt drank 100% of Ensure. Pt refusing to eat any of dinner tray. Pt's husband visited and brought a dessert and soda. Pt ate and drank 100% of what husband brought.

## 2023-06-02 NOTE — TOC Progression Note (Signed)
Transition of Care [facility]) - Progression Note    Patient Details  Name: Janet Murphy MRN: [id number] Date of Birth: 01-Feb-1956  Transition of Care [facility]) CM/SW Contact  Darleene Cleaver, [place] Phone Number: 06/02/2023, 7:27 PM  Clinical Narrative:     Email received from Sharlyne Cai at APS, she will ask [facility] to see patient and reevaluate her to see if they can accept her now that her behaviors are decreasing.  TOC to continue to follow patient throughout discharge planning.  Expected Discharge Plan: Memory Care Barriers to Discharge: Continued Medical Work up  Expected Discharge Plan and Services   Discharge Planning Services: CM Consult   Living arrangements for the past 2 months: Single Family Home                 DME Arranged: N/A DME Agency: NA                   Social Determinants of Health (SDOH) Interventions SDOH Screenings   Tobacco Use: High Risk (06/02/2023)    Readmission Risk Interventions     No data to display

## 2023-06-02 NOTE — ED Notes (Signed)
Pt in bed, safety sitter at bedside, pt moving all extremities.  Meal tray given, sitter helping pt with ADL.

## 2023-06-02 NOTE — ED Notes (Signed)
Pt had urinate on self. New sheets, gown, blanket and brief were placed

## 2023-06-02 NOTE — ED Notes (Addendum)
Pt ate 25% of breakfast and drank 120 mL of juice.

## 2023-06-03 DIAGNOSIS — N390 Urinary tract infection, site not specified: Secondary | ICD-10-CM | POA: Diagnosis not present

## 2023-06-03 DIAGNOSIS — E041 Nontoxic single thyroid nodule: Secondary | ICD-10-CM | POA: Diagnosis not present

## 2023-06-03 MED ORDER — FLUTICASONE PROPIONATE 50 MCG/ACT NA SUSP
1.0000 | Freq: Once | NASAL | Status: DC
  Filled 2023-06-03: qty 16

## 2023-06-03 NOTE — ED Notes (Signed)
Dietary contacted to send soft foods for pt

## 2023-06-03 NOTE — ED Notes (Signed)
Dietary contacted for ensure 

## 2023-06-03 NOTE — TOC Progression Note (Signed)
Transition of Care [facility]) - Progression Note    Patient Details  Name: Janet Murphy MRN: [id number] Date of Birth: May 01, 1956  Transition of Care [facility]) CM/SW Contact  Darolyn Rua, [place] Phone Number: 06/03/2023, 3:35 PM  Clinical Narrative:     CSW has sent email to Sharlyne Cai with DSS to determine day/time  House will be coming to re assess patient, pending response at this time.   Expected Discharge Plan: Memory Care Barriers to Discharge: Continued Medical Work up  Expected Discharge Plan and Services   Discharge Planning Services: CM Consult   Living arrangements for the past 2 months: Single Family Home                 DME Arranged: N/A DME Agency: NA                   Social Determinants of Health (SDOH) Interventions SDOH Screenings   Tobacco Use: High Risk (06/02/2023)    Readmission Risk Interventions     No data to display

## 2023-06-03 NOTE — ED Notes (Signed)
Dietary contacted for ensure x4

## 2023-06-03 NOTE — ED Notes (Signed)
Dietary contacted for ensure x3.  Attempted to assist with eating, pt ate 1 bite then refused.

## 2023-06-03 NOTE — ED Notes (Signed)
Dietary contacted for ensure x2

## 2023-06-03 NOTE — ED Notes (Signed)
This RN attempted to obtain VS. Pt crossed arms and stated "no no get away". Unable to persuade pt to obtain VS

## 2023-06-03 NOTE — ED Provider Notes (Signed)
-----------------------------------------   1:16 PM on 06/03/2023 -----------------------------------------   Blood pressure 121/70, pulse (!) 55, temperature (!) 97 F (36.1 C), temperature source Oral, resp. rate 14, SpO2 100 %.  The patient is calm and cooperative at this time.  There have been no acute events since the last update.  Awaiting disposition plan from case management/social work.    Corena Herter, MD 06/03/23 1316

## 2023-06-03 NOTE — ED Notes (Signed)
NT attempted to get vitals on PT. PT refused stating " leave me alone , turn off that light , I like it dark." PT is now resting in bed at this time with NT at bedside.

## 2023-06-03 NOTE — ED Notes (Signed)
This Nt checked Pt lien and blankets. Patient is resting comfortably.

## 2023-06-04 DIAGNOSIS — N390 Urinary tract infection, site not specified: Secondary | ICD-10-CM | POA: Diagnosis not present

## 2023-06-04 DIAGNOSIS — E041 Nontoxic single thyroid nodule: Secondary | ICD-10-CM | POA: Diagnosis not present

## 2023-06-04 NOTE — ED Notes (Signed)
Pt asleep - sitter at bedside. No needs at this time.

## 2023-06-04 NOTE — ED Notes (Signed)
TOC

## 2023-06-04 NOTE — ED Notes (Signed)
Pt dropped ice cream onto bedding at this time. Pt linens changed. Clean chux pad applied. Bed bath preformed with help from NT [facility]. Pt back in bed resting comfortably. Chest rise and fall noted at this time.

## 2023-06-04 NOTE — ED Provider Notes (Signed)
Emergency Medicine Observation Re-evaluation Note  Physical Exam   BP (!) 136/52 (BP Location: Left Arm)   Pulse 82   Temp 98.1 F (36.7 C) (Oral)   Resp 14   SpO2 96%   Patient appears in no acute distress.  ED Course / MDM   No reported events during my shift at the time of this note.   Pt is awaiting dispo from SW   Pilar Jarvis MD    Pilar Jarvis, MD 06/04/23 [phone]

## 2023-06-04 NOTE — ED Notes (Signed)
Pt up to commode to have a BM and tech advised pt started taking her stool and smearing it around. Pt became agitated and compliant with returning to bed. Pt eventually re-directed and PRN meds admin.

## 2023-06-04 NOTE — ED Notes (Signed)
TOC pending placement  

## 2023-06-04 NOTE — ED Notes (Signed)
This NT tried to take pts vitals but was unable to get a blood pressure or tempeture at this.

## 2023-06-04 NOTE — ED Notes (Signed)
Pt is sleeping, NAD, even chest rise and fall 

## 2023-06-04 NOTE — ED Notes (Signed)
Pt drank her chocolate ensure.

## 2023-06-05 NOTE — ED Notes (Signed)
Pt had breakfast. Pt had a banana and her Ensure.

## 2023-06-05 NOTE — ED Notes (Addendum)
Pt removed gown and brief and will no keep them on. Chux pad in placed as incontinence measure.   Pt provided a sip of water. Pt restless and periodically attempts to exit the bed. Pt redirectable at this time. Pt denied needing to use the bathroom and denied any further needs a this time. Safety sitter remains at bedside.

## 2023-06-05 NOTE — ED Notes (Signed)
Pt husband visited and brought pt a pepsi and a snack

## 2023-06-05 NOTE — ED Notes (Signed)
Pt lunch was brought up. Pt did not want any of it. Will keep at bedside in case pt changes her mind soon.

## 2023-06-06 NOTE — ED Notes (Signed)
Pt drank 100% of her Ensure

## 2023-06-06 NOTE — ED Notes (Signed)
This tech prepared breakfast tray for pt. The pt said "ew ew I don't want that". This tech put the tray to the side and will see if the pt wants any of it later.

## 2023-06-06 NOTE — ED Notes (Signed)
Dinner tray given to patient at this time.  

## 2023-06-06 NOTE — TOC Progression Note (Signed)
Transition of Care [facility]) - Progression Note    Patient Details  Name: Janet Murphy MRN: [id number] Date of Birth: [birth year]  Transition of Care [facility]) CM/SW Contact  Darleene Cleaver, [place] Phone Number: 06/06/2023, 5:59 PM  Clinical Narrative:     [facility], to review patient, waiting on decision from them.  Expected Discharge Plan: Memory Care Barriers to Discharge: Continued Medical Work up  Expected Discharge Plan and Services   Discharge Planning Services: CM Consult   Living arrangements for the past 2 months: Single Family Home                 DME Arranged: N/A DME Agency: NA                   Social Determinants of Health (SDOH) Interventions SDOH Screenings   Tobacco Use: High Risk (06/02/2023)    Readmission Risk Interventions     No data to display

## 2023-06-06 NOTE — ED Notes (Signed)
Pt ripped off clean brief and threw it at the other pts bed within room. Pt also removed her gown and threw it on the floor. Pt continues to climb out of bed and is not easily redirected to stay in bed. Pt has been ambulated to the toilet within the room and has not voided. Pt is currently completely naked and often throw her blanket and/or chux pad. Safety sitter at bedside.

## 2023-06-06 NOTE — ED Notes (Signed)
Pt got up out of bed and stated "have to pee". Pt urinated in the bed on the bed pad. This tech assisted the pt to the toilet. The pt urinated in the toilet. This tech changed the bed pad and assisted pt with peri care.

## 2023-06-06 NOTE — ED Notes (Signed)
This tech took over sitting with pt. Report was received from of going sitter.

## 2023-06-06 NOTE — TOC Progression Note (Signed)
Transition of Care [facility]) - Progression Note    Patient Details  Name: Janet Murphy MRN: [id number] Date of Birth: 1956/11/18  Transition of Care [facility]) CM/SW Contact  Darolyn Rua, [place] Phone Number: 06/06/2023, 8:59 AM  Clinical Narrative:     Per Ellin Saba with DSS she is following up on [facility] assessment of patient, reports she was off Friday but will follow up today, Monday.   Expected Discharge Plan: Memory Care Barriers to Discharge: Continued Medical Work up  Expected Discharge Plan and Services   Discharge Planning Services: CM Consult   Living arrangements for the past 2 months: Single Family Home                 DME Arranged: N/A DME Agency: NA                   Social Determinants of Health (SDOH) Interventions SDOH Screenings   Tobacco Use: High Risk (06/02/2023)    Readmission Risk Interventions     No data to display

## 2023-06-06 NOTE — ED Notes (Signed)
This tech attempted to get pt's vitals. The pt took off bp cuff and pulse oximeter and told this tech "leave me alone leave me alone". Will attempt to get vitals at a later time.

## 2023-06-07 DIAGNOSIS — E041 Nontoxic single thyroid nodule: Secondary | ICD-10-CM | POA: Diagnosis not present

## 2023-06-07 DIAGNOSIS — N390 Urinary tract infection, site not specified: Secondary | ICD-10-CM | POA: Diagnosis not present

## 2023-06-07 NOTE — ED Notes (Signed)
This tech is ending sitting with pt. Will give report to oncoming tech.

## 2023-06-07 NOTE — ED Notes (Addendum)
Pt is currently sleeping. Per nightshift staff, Pt had a very restless night. A full set of Vitals signs will be obtained once Pt wakes up. Currently letting Pt rest.

## 2023-06-07 NOTE — ED Notes (Signed)
This tech took over sitting with pt. Report received from off going tech.

## 2023-06-07 NOTE — ED Provider Notes (Signed)
Emergency Medicine Observation Re-evaluation Note  Lucila Poplar is a 67 y.o. female, seen on rounds today.  Pt initially presented to the ED for complaints of Mental Health Problem  Currently, the patient is resting in bed. No reported issues from nursing team.   Physical Exam  BP (!) 145/76 (BP Location: Right Arm)   Pulse 62   Temp (!) 97.4 F (36.3 C) (Axillary)   Resp 14   SpO2 99%  Physical Exam General: Resting comfortably in bed  ED Course / MDM  EKG:   Plan  Current plan is for dispo per social work.    Trinna Post, MD 06/07/23 [phone]

## 2023-06-07 NOTE — ED Notes (Signed)
Pt ambulated to toilet to void. Pt back in bed with no other needs at this time. Bed is in the lowest and locked position,.

## 2023-06-07 NOTE — ED Provider Notes (Signed)
-----------------------------------------   5:34 AM on 06/07/2023 -----------------------------------------   Blood pressure 138/74, pulse 74, temperature 98.2 F (36.8 C), temperature source Oral, resp. rate 16, SpO2 97 %.  The patient is calm and cooperative at this time.  There have been no acute events since the last update.  Awaiting disposition plan from Social Work team.   Irean Hong, MD 06/07/23 [phone]

## 2023-06-07 NOTE — ED Notes (Signed)
Pt took off her gown, urinated a large amount in the bed and then got up to bedside toilet to urine some more. Linens changed and bed pad changed. Pt's gown dry. Pt back to bed.

## 2023-06-07 NOTE — ED Notes (Signed)
Pt irritable, agitated, restless and uncooperative with Caitlyn NT sitter.

## 2023-06-07 NOTE — ED Notes (Signed)
This NT changed pt after she urinated on herself. This NT also changed all of pts bedding.

## 2023-06-07 NOTE — ED Notes (Signed)
Pt woke up for a brief moment to reposition. Offered Pt breakfast to Pt, but she said "Sleeping, Sleeping" and closed her eyes again. Will offer breakfast again once Pt wakes up again.

## 2023-06-07 NOTE — ED Notes (Signed)
Pt did eat some of her breakfast. She ate her whole banana, some of her bacon, a whole orange juice. Pt spit out her grits and her eggs.

## 2023-06-07 NOTE — ED Notes (Signed)
Per nt, pt is up and out of bed pulling on thing, states that pt is becoming agitated, prn med given

## 2023-06-07 NOTE — ED Notes (Addendum)
Pt briefly awake. PT given two ensure to drink. PT drank 100%. Pt also ate two cookies her husband had brought in earlier when she was asleep.

## 2023-06-08 NOTE — ED Notes (Signed)
Attempted to Take pts VS but pt keeps pulling off cuff and pulse ox , will try again later

## 2023-06-08 NOTE — ED Notes (Signed)
Pt stated she was cold. This EDT gave pt peri care and change the pt's linen. EDT gave pt a new pair of briefs and clothes to put on. Pt is currently laying in bed looking at the ceiling with warm blankets, EDT at bedside.

## 2023-06-08 NOTE — ED Notes (Signed)
Family at bedside. 

## 2023-06-08 NOTE — ED Notes (Signed)
Pt had soiled themselves and was given a full shower, pt's hair was washed and EDT was able to clean up under pt's fingernails. Pt was given a clean brief and gown to put on. When pt got back in bed EDT provided pt with clean sheets and warm blanket. Pt is currently laying in bed  fully dressed watching TV with EDT at bedside. Pt was asked if they needed anything else right now. Pt looked and stated " No I'm fine goodnight".

## 2023-06-08 NOTE — ED Notes (Signed)
This tech helped the patient clean herself up as she had an accident in bed. Pt got up to urinate in the commode. Wash cloths with soap and water were provided for patient with needed assistance, bed linens were changed and patient is comfortable.

## 2023-06-08 NOTE — ED Notes (Signed)
Pt had a BM in toilet and bed/ this nurse and tech assisted with cleaning up pt/ pt refuses to wear clothes/ clean bed linen and blankets provided/ pt is in bed and cooperative at this time

## 2023-06-09 DIAGNOSIS — N390 Urinary tract infection, site not specified: Secondary | ICD-10-CM | POA: Diagnosis not present

## 2023-06-09 DIAGNOSIS — E041 Nontoxic single thyroid nodule: Secondary | ICD-10-CM | POA: Diagnosis not present

## 2023-06-09 NOTE — ED Notes (Addendum)
PT was pointing at dinner tray. This NT offered dinner tray again to PT, PT refused. This NT notified nurse. PT was given another dinner tray, ensure, applesauce and crackers with peanut butter. PT refused dinner tray but ate 25% of chips. Ate 100% of ensure,applesauce and crackers.

## 2023-06-09 NOTE — ED Notes (Signed)
Pt uncooperative with vitals at this time.

## 2023-06-09 NOTE — TOC Progression Note (Signed)
Transition of Care [facility]) - Progression Note    Patient Details  Name: Janet Murphy MRN: [id number] Date of Birth: 05-15-1956  [facility]) CM/SW Contact  Halford Chessman Phone Number: 06/09/2023, 9:34 AM  Clinical Narrative:     [facility] to reevaluate, waiting for decision.  Expected Discharge Plan: Memory Care Barriers to Discharge: Continued Medical Work up  Expected Discharge Plan and Services   Discharge Planning Services: CM Consult   Living arrangements for the past 2 months: Single Family Home                 DME Arranged: N/A DME Agency: NA                   Social Determinants of Health (SDOH) Interventions SDOH Screenings   Tobacco Use: High Risk (06/02/2023)    Readmission Risk Interventions     No data to display

## 2023-06-09 NOTE — ED Notes (Signed)
Pt not cooperative with VS. 

## 2023-06-09 NOTE — ED Notes (Signed)
Pt given breakfast tray

## 2023-06-09 NOTE — ED Notes (Signed)
Pt refuses vitals to be obtained

## 2023-06-09 NOTE — ED Provider Notes (Signed)
-----------------------------------------   6:26 AM on 06/09/2023 -----------------------------------------   Blood pressure (!) 145/76, pulse 62, temperature 98.1 F (36.7 C), temperature source Axillary, resp. rate 14, SpO2 99 %.  The patient is calm and cooperative at this time.  There have been no acute events since the last update.  Awaiting disposition plan from Social Work team.   Irean Hong, MD 06/09/23 [phone]

## 2023-06-10 NOTE — TOC Progression Note (Signed)
Transition of Care [facility]) - Progression Note    Patient Details  Name: Jenah Vanasten MRN: [id number] Date of Birth: 20-Aug-1956  Transition of Care [facility]) CM/SW Contact  Darleene Cleaver, [place] Phone Number: 06/10/2023, 2:38 PM  Clinical Narrative:     CSW spoke to Thayer Ohm, APS legal guardian to get an update from [facility].  Per Nia, she will try to contact [facility] for an update on when patient will be assessed.  Expected Discharge Plan: Memory Care Barriers to Discharge: Continued Medical Work up  Expected Discharge Plan and Services   Discharge Planning Services: CM Consult   Living arrangements for the past 2 months: Single Family Home                 DME Arranged: N/A DME Agency: NA                   Social Determinants of Health (SDOH) Interventions SDOH Screenings   Tobacco Use: High Risk (06/02/2023)    Readmission Risk Interventions     No data to display

## 2023-06-10 NOTE — ED Notes (Addendum)
Patient up multiple times to use the toilet with success/ gave her some water now easing off to sleep under covers without gown on at this time.

## 2023-06-10 NOTE — ED Notes (Signed)
Patient awake continually sitting up and laying back down with gown on/off at times saying thank you for when you help her get redressed or cover her up. Patient got up @ 1733/2040 to use toilet urinated and got back into bed. Saying [name]

## 2023-06-10 NOTE — ED Notes (Signed)
Pt drank 100% of her Ensure 

## 2023-06-10 NOTE — ED Notes (Signed)
Pt was given a new gown, cleaned, and provided with new linen.

## 2023-06-10 NOTE — ED Notes (Signed)
Pt given ice water.
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
## 2023-06-10 NOTE — ED Notes (Signed)
Pt refusing vitals at this time.

## 2023-06-10 NOTE — ED Notes (Signed)
Pt had some urinary incontinence on bed. This tech cleaned the bed, put new linens on the bed, gave the pt a new gown, and put a clean pad under the pt. The pt is now resting in the bed.

## 2023-06-11 DIAGNOSIS — E041 Nontoxic single thyroid nodule: Secondary | ICD-10-CM | POA: Diagnosis not present

## 2023-06-11 DIAGNOSIS — N390 Urinary tract infection, site not specified: Secondary | ICD-10-CM | POA: Diagnosis not present

## 2023-06-11 NOTE — ED Notes (Signed)
Pts lunch tray was delivered. Will give to pt when they wake up

## 2023-06-11 NOTE — ED Notes (Signed)
Received handoff from Kim NT and began care of patient she was in bed looking around. Got up to the toilet quite a few times. Gave pt. Applesauce, graham crackers and water. Dinner tray arrived patient did not eat food just drank tea ate 2 spoonfuls of peaches. Then said no don't want it throw it away. Husband came to visit and gave her a snack cake and some soft drink. Patient said goodbye I love you to spouse and pulled up covers saying Good night. Patient eased off to sleep.  Room lights low,TV playing soft music care station.

## 2023-06-11 NOTE — ED Provider Notes (Signed)
Emergency Medicine Observation Re-evaluation Note  Physical Exam   BP (!) 152/110 Comment: Pt fighting during vitals  Pulse 71   Temp 97.8 F (36.6 C) (Axillary)   Resp 20   SpO2 90%   Patient appears in no acute distress.  ED Course / MDM   No reported events during my shift at the time of this note.   Pt is awaiting dispo from SW   Pilar Jarvis MD    Pilar Jarvis, MD 06/11/23 [phone]

## 2023-06-11 NOTE — ED Notes (Incomplete)
Pt urinated in bed. This NT and Emilee NT changed linen and assisted pt with peri care

## 2023-06-12 DIAGNOSIS — E041 Nontoxic single thyroid nodule: Secondary | ICD-10-CM | POA: Diagnosis not present

## 2023-06-12 DIAGNOSIS — N390 Urinary tract infection, site not specified: Secondary | ICD-10-CM | POA: Diagnosis not present

## 2023-06-12 LAB — CBC
HCT: 39.7 % (ref 36.0–46.0)
Hemoglobin: 13.1 g/dL (ref 12.0–15.0)
MCH: 30.8 pg (ref 26.0–34.0)
MCHC: 33 g/dL (ref 30.0–36.0)
MCV: 93.2 fL (ref 80.0–100.0)
Platelets: 362 10*3/uL (ref 150–400)
RBC: 4.26 MIL/uL (ref 3.87–5.11)
RDW: 12.5 % (ref 11.5–15.5)
WBC: 7.3 10*3/uL (ref 4.0–10.5)
nRBC: 0 % (ref 0.0–0.2)

## 2023-06-12 LAB — CREATININE, SERUM
Creatinine, Ser: 0.67 mg/dL (ref 0.44–1.00)
GFR, Estimated: >60 mL/min (ref >60)

## 2023-06-12 MED ORDER — ENOXAPARIN SODIUM 40 MG/0.4ML IJ SOSY
40.0000 mg | PREFILLED_SYRINGE | INTRAMUSCULAR | Status: DC
  Administered 2023-06-12 – 2023-06-22 (×4): 40 mg via SUBCUTANEOUS
  Filled 2023-06-12 (×8): qty 0.4

## 2023-06-12 NOTE — ED Notes (Signed)
Nurse provided pt with an ensure. Pt drank all of ensure.

## 2023-06-12 NOTE — ED Notes (Signed)
Dinner tray arrived. Pt refused to eat any.

## 2023-06-12 NOTE — ED Notes (Signed)
Nurse brought pt an Ensure. Pt consumed all of it.

## 2023-06-12 NOTE — ED Provider Notes (Signed)
Emergency Medicine Observation Re-evaluation Note  Janet Murphy is a 67 y.o. female, seen on rounds today.  Pt initially presented to the ED for complaints of Mental Health Problem  Currently, the patient is resting comfortably.  Physical Exam  BP (!) 151/99 (BP Location: Right Arm)   Pulse 70   Temp 98.4 F (36.9 C) (Axillary)   Resp 20   Ht 5\' 7"  (1.702 m)   Wt 46 kg   SpO2 91%   BMI 15.88 kg/m  General: No acute distress Cardiac: Well-perfused extremities Lungs: No respiratory distress Psych: Appropriate mood and affect  ED Course / MDM  EKG:   I have reviewed the labs performed to date as well as medications administered while in observation.  Recent changes in the last 24 hours include none.  Plan  Current plan is for placement.   Merwyn Katos, MD 06/12/23 [phone]

## 2023-06-12 NOTE — ED Notes (Signed)
Pt allowed this RN and nurse tech to get blood work.

## 2023-06-12 NOTE — ED Notes (Addendum)
Patient refused vital signs when this tech attempted to assess them. The pt allowed this tech to attach the blood pressure cuff then immediately snatched the cuff off and threw it on the floor. RN notified.

## 2023-06-12 NOTE — ED Notes (Signed)
Lunch tray arrived. Pt refused to eat any of it. Pt drank two ensures and a couple graham crackers. Pt seem to thrive better on finger foods.

## 2023-06-12 NOTE — ED Notes (Signed)
Pt had a visitor. Visitor brought pt chicken and a snack cake. Pt refused the chicken.

## 2023-06-12 NOTE — ED Provider Notes (Signed)
Emergency Medicine Observation Re-evaluation Note  Janet Murphy is a 67 y.o. female, seen on rounds today.  Pt initially presented to the ED for complaints of Mental Health Problem  Currently, the patient is calm, no acute complaints.  Physical Exam  Blood pressure (!) 152/110, pulse 71, temperature 98.4 F (36.9 C), temperature source Axillary, resp. rate 20, SpO2 90 %. Physical Exam General: NAD Lungs: CTAB Psych: not agitated  ED Course / MDM  EKG:    I have reviewed the labs performed to date as well as medications administered while in observation.  Recent changes in the last 24 hours include no acute events overnight.    Plan  Current plan is for TOC placement. Will start vte ppx due to prolonged ED boarding   Sharman Cheek, MD 06/12/23 [phone]

## 2023-06-12 NOTE — ED Notes (Signed)
This NT gave pt bed bath. Danielle NT assisted with pt care. Pt has clean sheet on her bed and clean blankets. Pt is resting comfortably.

## 2023-06-12 NOTE — ED Notes (Signed)
Pt had a banana and her orange juice for breakfast. Pt refused all other breakfast.

## 2023-06-12 NOTE — ED Notes (Signed)
As per previous RN, pt refusing blood work at this time.

## 2023-06-13 DIAGNOSIS — N390 Urinary tract infection, site not specified: Secondary | ICD-10-CM | POA: Diagnosis not present

## 2023-06-13 DIAGNOSIS — E041 Nontoxic single thyroid nodule: Secondary | ICD-10-CM | POA: Diagnosis not present

## 2023-06-13 MED ORDER — ENSURE ENLIVE PO LIQD
237.0000 mL | Freq: Two times a day (BID) | ORAL | Status: DC
  Administered 2023-06-14 – 2023-06-15 (×3): 237 mL via ORAL

## 2023-06-13 NOTE — ED Notes (Signed)
Breakfast tray provided. Pt stated "I dont want it, throw it out". Pt drank 100% of Ensure.

## 2023-06-13 NOTE — ED Notes (Signed)
Dietary contacted for ensure 

## 2023-06-13 NOTE — ED Notes (Signed)
Patient refused dinner plate.

## 2023-06-13 NOTE — ED Provider Notes (Signed)
Emergency Medicine Observation Re-evaluation Note  Physical Exam   BP (!) 151/99 (BP Location: Right Arm)   Pulse 70   Temp 98.4 F (36.9 C) (Axillary)   Resp 19   Ht 5\' 7"  (1.702 m)   Wt 46 kg   SpO2 91%   BMI 15.88 kg/m   Patient appears in no acute distress.  ED Course / MDM   No reported events during my shift at the time of this note.   Pt is awaiting dispo from SW   Pilar Jarvis MD    Pilar Jarvis, MD 06/13/23 [phone]
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
## 2023-06-13 NOTE — ED Notes (Signed)
Patient refused to allow sitter and/or this RN to obtain vital signs.

## 2023-06-13 NOTE — ED Provider Notes (Signed)
Emergency Medicine Observation Re-evaluation Note  Janet Murphy is a 67 y.o. female, seen on rounds today.  Pt initially presented to the ED for complaints of Mental Health Problem   Physical Exam  BP (!) 151/99 (BP Location: Right Arm)   Pulse 70   Temp 98.4 F (36.9 C) (Axillary)   Resp 19   Ht 5\' 7"  (1.702 m)   Wt 46 kg   SpO2 91%   BMI 15.88 kg/m  Physical Exam General: nad  ED Course / MDM  EKG:   I have reviewed the labs performed to date as well as medications administered while in observation.    Plan  Current plan is for Willette Pa, MD 06/13/23 [phone]

## 2023-06-13 NOTE — ED Notes (Signed)
Lunch tray provided. Pt consumed only the ensure, refused other food items. Waste discarded appropriately.

## 2023-06-13 NOTE — ED Notes (Signed)
Patient has a [employer] trader. Visitor brought snacks and something to drink.

## 2023-06-13 NOTE — ED Notes (Signed)
Patient refused vital signs 

## 2023-06-14 DIAGNOSIS — E041 Nontoxic single thyroid nodule: Secondary | ICD-10-CM | POA: Diagnosis not present

## 2023-06-14 DIAGNOSIS — N390 Urinary tract infection, site not specified: Secondary | ICD-10-CM | POA: Diagnosis not present

## 2023-06-14 NOTE — TOC Progression Note (Signed)
Transition of Care [facility]) - Progression Note    Patient Details  Name: Janet Murphy MRN: [id number] Date of Birth: 1956-02-12  Transition of Care [facility]) CM/SW Contact  Halford Chessman Phone Number: 06/14/2023, 6:39 PM  Clinical Narrative:     Attempted to contact [facility] and DSS to see if they have any updates on placement.  Per nursing patient's behaviors have decreased.  Expected Discharge Plan: Memory Care Barriers to Discharge: Continued Medical Work up  Expected Discharge Plan and Services   Discharge Planning Services: CM Consult   Living arrangements for the past 2 months: Single Family Home                 DME Arranged: N/A DME Agency: NA                   Social Determinants of Health (SDOH) Interventions SDOH Screenings   Tobacco Use: High Risk (06/02/2023)    Readmission Risk Interventions     No data to display

## 2023-06-14 NOTE — ED Provider Notes (Signed)
Emergency Medicine Observation Re-evaluation Note  Janet Murphy is a 67 y.o. female, seen on rounds today.  Pt initially presented to the ED for complaints of Mental Health Problem Currently, the patient is calm, in NAD.  Physical Exam  BP (!) 144/96   Pulse 72   Temp 98.3 F (36.8 C) (Axillary)   Resp 18   Ht 5\' 7"  (1.702 m)   Wt 46 kg   SpO2 93%   BMI 15.88 kg/m    ED Course / MDM  EKG:   I have reviewed the labs performed to date as well as medications administered while in observation.  Recent changes in the last 24 hours include none.  Plan  Current plan is for TOC dispo.    Shaune Pollack, MD 06/14/23 [phone]

## 2023-06-14 NOTE — ED Notes (Signed)
This EDT tried to take vitals from this pt. Pt refused and said "No, turn off lights and leave me alone". Will continue to monitor.

## 2023-06-14 NOTE — ED Notes (Signed)
Pt did not like her breakfast that was brought to her, eggs, grits, bacon. Pt did have 2 Ensures and 2 orange juice's

## 2023-06-14 NOTE — ED Notes (Addendum)
Not taking of this patient

## 2023-06-14 NOTE — ED Notes (Signed)
Pt naked, gown placed on pt again.   Sitter with pt.  Pt alert.

## 2023-06-14 NOTE — ED Notes (Signed)
Pt refused VS, NAD, sitter at bedside 

## 2023-06-15 MED ORDER — ENSURE ENLIVE PO LIQD
237.0000 mL | Freq: Three times a day (TID) | ORAL | Status: DC
  Administered 2023-06-15 – 2023-06-22 (×19): 237 mL via ORAL

## 2023-06-15 NOTE — ED Notes (Signed)
Patient is resting comfortably. 

## 2023-06-15 NOTE — ED Notes (Signed)
Pt used the restroom 

## 2023-06-15 NOTE — ED Notes (Signed)
Pt resting comfortably in bed

## 2023-06-15 NOTE — ED Notes (Addendum)
Pt drank 100% of ensure. Pt denied tray at this time. Pt under covers stating "leave me alone".

## 2023-06-15 NOTE — ED Notes (Signed)
PT sleeping at this time will obtain VS later.

## 2023-06-15 NOTE — ED Notes (Signed)
This EDT tried to take vitals from this pt. Pt refused and said "No, turn off lights and leave me alone. That hurts stop." RN notified.

## 2023-06-15 NOTE — ED Notes (Signed)
Pt cleaned up at this time bed was soiled. Pt given new gown, bed sheets, new chucks and blankets. No other needs at this time.

## 2023-06-15 NOTE — NC FL2 (Addendum)
[place] MEDICAID [facility] LEVEL OF CARE FORM     IDENTIFICATION  Patient Name: Janet Murphy Birthdate: 1956-05-26 Sex: female Admission Date (Current Location): 02/08/2023  [place] and [place] Number:  Chiropodist and Address:  [facility], [address] [place], [place] [address]      Provider Number: [id number]  Attending Physician Name and Address:  No att. providers found  Relative Name and Phone Number:  [facility] Legal Guardian  [phone]   [place] DSS. Legal Guardian   [phone]  Deese,[name] Spouse   [phone]    Current Level of Care: Hospital Recommended Level of Care: Assisted Living Facility, Memory Care Prior Approval Number:    Date Approved/Denied:   PASRR Number:    Discharge Plan: Domiciliary (Rest home) (Memory Care ALF)    Current Diagnoses: Patient Active Problem List   Diagnosis Date Noted   Dementia with behavioral disturbance (HCC) 12/31/2022   Vitamin D deficiency 06/16/2017   B12 deficiency 06/16/2017   Underweight 06/15/2017   DDD (degenerative disc disease), thoracic 03/22/2014    Orientation RESPIRATION BLADDER Height & Weight     Self  Normal Incontinent Weight: 101 lb 6.6 oz (46 kg) Height:  5\' 7"  (170.2 cm)  BEHAVIORAL SYMPTOMS/MOOD NEUROLOGICAL BOWEL NUTRITION STATUS  Wanderer, Verbally abusive, Other (Comment) (Smearing fecal matter)  (Dementia) Incontinent Diet (Regular)  AMBULATORY STATUS COMMUNICATION OF NEEDS Skin   Supervision Verbally Normal                       Personal Care Assistance Level of Assistance  Feeding, Dressing, Bathing Bathing Assistance: Limited assistance Feeding assistance: Independent Dressing Assistance: Limited assistance     Functional Limitations Info  Sight, Hearing, Speech Sight Info: Adequate Hearing Info: Adequate Speech Info: Adequate    SPECIAL CARE FACTORS FREQUENCY                        Contractures Contractures Info: Not present    Additional Factors Info  Code Status, Allergies, Psychotropic Code Status Info: Full code Allergies Info: NKDA Psychotropic Info: OLANZapine (ZYPREXA) tablet 2.5 mg         Current Medications (06/15/2023):  This is the current hospital active medication list Current Facility-Administered Medications  Medication Dose Route Frequency Provider Last Rate Last Admin   acetaminophen (TYLENOL) tablet 1,000 mg  1,000 mg Oral Q8H PRN Sharyn Creamer, MD   1,000 mg at 06/08/23 2109   benzocaine (ORAJEL) 10 % mucosal gel   Mouth/Throat QID PRN Pilar Jarvis, MD   Given at 05/11/23 2238   enoxaparin (LOVENOX) injection 40 mg  40 mg Subcutaneous Q24H Sharman Cheek, MD   40 mg at 06/12/23 0931   feeding supplement (ENSURE ENLIVE / ENSURE PLUS) liquid 237 mL  237 mL Oral BID WC Dionne Bucy, MD   237 mL at 06/15/23 1027   fluticasone (FLONASE) 50 MCG/ACT nasal spray 1 spray  1 spray Each Nare Once Merwyn Katos, MD       LORazepam (ATIVAN) tablet 1 mg  1 mg Oral Q6H PRN Dionne Bucy, MD   1 mg at 06/08/23 2109   melatonin tablet 2.5 mg  2.5 mg Oral QHS Irean Hong, MD   2.5 mg at 06/14/23 2129   nystatin (MYCOSTATIN/NYSTOP) topical powder   Topical BID Sharman Cheek, MD   Given at 06/14/23 2130   OLANZapine (ZYPREXA) tablet 5 mg  5 mg Oral BID  Clapacs, Jackquline Denmark, MD   5 mg at 06/15/23 2440   OLANZapine zydis (ZYPREXA) disintegrating tablet 2.5 mg  2.5 mg Oral BID PRN Vanetta Mulders, NP   2.5 mg at 05/26/23 0117   Current Outpatient Medications  Medication Sig Dispense Refill   OLANZapine (ZYPREXA) 2.5 MG tablet Take 2.5 mg by mouth 2 (two) times daily.       Discharge Medications: Please see discharge summary for a list of discharge medications.  Relevant Imaging Results:  Relevant Lab Results:   Additional Information SS#: [id number]  Darleene Cleaver, LCSW

## 2023-06-15 NOTE — ED Notes (Addendum)
Patient is resting comfortably. 

## 2023-06-15 NOTE — TOC Progression Note (Signed)
Transition of Care [facility]) - Progression Note    Patient Details  Name: Janet Murphy MRN: [id number] Date of Birth: March 25, 1956  Transition of Care [facility]) CM/SW Contact  Darleene Cleaver, [place] Phone Number: 06/15/2023, 5:40 PM  Clinical Narrative:     CSW spoke to Adan Sis, ED, [phone], at [facility]. She states she does have some memory care beds available. Referral emailed to [facility] to review patient's information. TOC awaiting on a decision if they can accept patient.   Expected Discharge Plan: Memory Care Barriers to Discharge: Continued Medical Work up  Expected Discharge Plan and Services   Discharge Planning Services: CM Consult   Living arrangements for the past 2 months: Single Family Home                 DME Arranged: N/A DME Agency: NA                   Social Determinants of Health (SDOH) Interventions SDOH Screenings   Tobacco Use: High Risk (06/02/2023)    Readmission Risk Interventions     No data to display

## 2023-06-15 NOTE — ED Notes (Signed)
Pt husband at bedside

## 2023-06-15 NOTE — ED Notes (Signed)
Patient had her ensure, along with two cookies. Patient is currently resting at this time.

## 2023-06-16 DIAGNOSIS — N390 Urinary tract infection, site not specified: Secondary | ICD-10-CM | POA: Diagnosis not present

## 2023-06-16 DIAGNOSIS — E041 Nontoxic single thyroid nodule: Secondary | ICD-10-CM | POA: Diagnosis not present

## 2023-06-16 NOTE — ED Notes (Signed)
Pt given breakfast tray

## 2023-06-16 NOTE — ED Notes (Signed)
Patient lying in bed at this time with eyes closed and continues to lay on the right side. Chest rise and fall noted. Resp even and unlabored. Skin appears pink and dry. Nad noted at this time. Safety sitter at bedside at this time.

## 2023-06-16 NOTE — ED Notes (Signed)
Patient is resting comfortably. 

## 2023-06-16 NOTE — ED Notes (Signed)
Patient lying in bed at this time with eyes closed. Patient continues to lie on the right side. Chest rise and fall noted. Resp even and nonlabored. Skin appears pink and dry. Nad noted at this time. Safety sitter at bedside at this time.

## 2023-06-16 NOTE — ED Notes (Signed)
Husband in with pt now

## 2023-06-16 NOTE — ED Notes (Signed)
Assumed care for patient at this time.

## 2023-06-16 NOTE — ED Provider Notes (Signed)
-----------------------------------------   5:47 AM on 06/16/2023 -----------------------------------------   Blood pressure 137/88, pulse 76, temperature 98.7 F (37.1 C), temperature source Oral, resp. rate 18, height 5\' 7"  (1.702 m), weight 46 kg, SpO2 96 %.  The patient is calm and cooperative at this time.  There have been no acute events since the last update.  Awaiting disposition plan from Social Work team.   Irean Hong, MD 06/16/23 [phone]

## 2023-06-16 NOTE — ED Notes (Signed)
Patient resting and no distress noted. Sitter at the bedside.  

## 2023-06-16 NOTE — ED Notes (Signed)
Patient lying in bed at this time and continues to lie on right side with eyes closed. Chest rise and fall noted. Skin appears pink and dry. Resp even and nonlabored. Nad noted at this time. Safety sitter present at bedside.

## 2023-06-16 NOTE — ED Notes (Signed)
Patient lying in bed at this time on the right side with eyes closed. Chest rise and fall noted. Skin appears pink and dry. Resp even and nonlabored. Nad noted at this time. Safety sitter present at bedside.

## 2023-06-16 NOTE — ED Notes (Signed)
Resumed care from brandon rn.  Pt standing naked in her room   sitter with pt.  Pt redirected to bed and gown placed on pt.

## 2023-06-17 DIAGNOSIS — N390 Urinary tract infection, site not specified: Secondary | ICD-10-CM | POA: Diagnosis not present

## 2023-06-17 DIAGNOSIS — E041 Nontoxic single thyroid nodule: Secondary | ICD-10-CM | POA: Diagnosis not present

## 2023-06-17 NOTE — ED Notes (Signed)
Unable to obtain vital signs - pt uncooperative; attempting to remove BP cuff and get out of bed.

## 2023-06-17 NOTE — ED Notes (Signed)
Pt bed was saturated with urine. All linens changed

## 2023-06-17 NOTE — ED Provider Notes (Signed)
Emergency Medicine Observation Re-evaluation Note  Janet Murphy is a 67 y.o. female, seen on rounds today.  Pt initially presented to the ED for complaints of Mental Health Problem  Currently, the patient is resting in bed. No reported issues from nursing team.   Physical Exam  BP 137/88   Pulse 76   Temp 98.7 F (37.1 C) (Oral)   Resp 18   Ht 5\' 7"  (1.702 m)   Wt 46 kg   SpO2 96%   BMI 15.88 kg/m  Physical Exam General: Resting comfortably in bed  ED Course / MDM    Plan  Current plan is for dispo per social work.    Trinna Post, MD 06/17/23 [phone]

## 2023-06-17 NOTE — ED Notes (Signed)
Pt only wanted her ensure for lunch, drank 100%.

## 2023-06-17 NOTE — ED Notes (Signed)
Pt awake laying in bed quietly, showing no signs of acute distress.

## 2023-06-18 DIAGNOSIS — E041 Nontoxic single thyroid nodule: Secondary | ICD-10-CM | POA: Diagnosis not present

## 2023-06-18 DIAGNOSIS — N390 Urinary tract infection, site not specified: Secondary | ICD-10-CM | POA: Diagnosis not present

## 2023-06-18 NOTE — ED Notes (Signed)
Pt at a fruit cup and an ensure for dinner. Pt refused all other items on dinner tray. Pt is now comfortable in bed.

## 2023-06-18 NOTE — ED Provider Notes (Signed)
-----------------------------------------   8:14 AM on 06/18/2023 -----------------------------------------   Blood pressure 137/88, pulse 76, temperature 98.7 F (37.1 C), temperature source Oral, resp. rate 18, height 1.702 m (5\' 7" ), weight 46 kg, SpO2 96 %.  The patient is calm and cooperative at this time.  There have been no acute events since the last update.  Awaiting disposition plan from [facility] team.   Loleta Rose, MD 06/18/23 [phone]

## 2023-06-18 NOTE — ED Notes (Signed)
Pt ate a bowl of mashed potatoes for lunch and drank a chocolate ensure. After pt's lunch the EDT took pt to the shower. Pt washed her own hair and legs EDT handled the rest. Once pt's shower was complete pt got herself dressed with the help of EDT. Once pt was fully dressed pt went back to her room and got in bed. Pt is currently laying in bed watching TV with EDT at bedside.

## 2023-06-18 NOTE — ED Notes (Signed)
Pt in bed given 2 warm blankets. Sitter at bedside

## 2023-06-18 NOTE — ED Notes (Signed)
Husband at bedside.  

## 2023-06-18 NOTE — ED Notes (Signed)
Patient woke up at 10am. Patient had her breakfast ensure, along with two cups of applesauce. Patient got cleaned up and clean sheets on her bed. Patient is currently saying "Take me to heaven." Patient is currently resting at this moment.

## 2023-06-19 DIAGNOSIS — N390 Urinary tract infection, site not specified: Secondary | ICD-10-CM | POA: Diagnosis not present

## 2023-06-19 DIAGNOSIS — E041 Nontoxic single thyroid nodule: Secondary | ICD-10-CM | POA: Diagnosis not present

## 2023-06-19 LAB — CBC
HCT: 36.5 % (ref 36.0–46.0)
Hemoglobin: 12.2 g/dL (ref 12.0–15.0)
MCH: 31 pg (ref 26.0–34.0)
MCHC: 33.4 g/dL (ref 30.0–36.0)
MCV: 92.6 fL (ref 80.0–100.0)
Platelets: 321 10*3/uL (ref 150–400)
RBC: 3.94 MIL/uL (ref 3.87–5.11)
RDW: 12.5 % (ref 11.5–15.5)
WBC: 8 10*3/uL (ref 4.0–10.5)
nRBC: 0 % (ref 0.0–0.2)

## 2023-06-19 LAB — CREATININE, SERUM
Creatinine, Ser: 0.64 mg/dL (ref 0.44–1.00)
GFR, Estimated: >60 mL/min (ref >60)

## 2023-06-19 NOTE — ED Notes (Signed)
Pt didn't want her breakfast tray. Pt ate her banana with some sips of orange juice.

## 2023-06-19 NOTE — ED Notes (Signed)
Pt given breakfast tray

## 2023-06-19 NOTE — ED Provider Notes (Signed)
-----------------------------------------   7:41 AM on 06/19/2023 -----------------------------------------    Vitals:   06/15/23 2300 06/18/23 1020  BP: 137/88   Pulse: 76   Resp: 18   Temp: 98.7 F (37.1 C) (!) 97.2 F (36.2 C)  SpO2: 96%    CBC and creatinine from this morning are normal  The patient is calm and cooperative at this time.  There have been no acute events since the last update.  Awaiting disposition plan from [facility] team, (? [facility], awaiting further).     Sharyn Creamer, MD 06/19/23 [phone]

## 2023-06-19 NOTE — ED Notes (Signed)
Pt received a shower. Pt has clean scrubs and a clean brief.

## 2023-06-19 NOTE — ED Notes (Signed)
Pt currently asking for lights to be turned off and sleep. Will attempt VS at another time.

## 2023-06-20 DIAGNOSIS — E041 Nontoxic single thyroid nodule: Secondary | ICD-10-CM | POA: Diagnosis not present

## 2023-06-20 DIAGNOSIS — N390 Urinary tract infection, site not specified: Secondary | ICD-10-CM | POA: Diagnosis not present

## 2023-06-20 NOTE — TOC Progression Note (Signed)
Transition of Care [facility]) - Progression Note    Patient Details  Name: Janet Murphy MRN: [id number] Date of Birth: Aug 10, 1956  Transition of Care [facility]) CM/SW Contact  Darleene Cleaver, [place] Phone Number: 06/20/2023, 6:49 PM  Clinical Narrative:     CSW spoke to ron at [facility], she said they can evaluate patient tomorrow at 9am.  CSW updated bedside nurse and Thayer Ohm from DSS who is the legal guardian.  TOC to continue to follow patient's progress throughout discharge planning.   Expected Discharge Plan: Memory Care Barriers to Discharge: Continued Medical Work up  Expected Discharge Plan and Services   Discharge Planning Services: CM Consult   Living arrangements for the past 2 months: Single Family Home                 DME Arranged: N/A DME Agency: NA                   Social Determinants of Health (SDOH) Interventions SDOH Screenings   Tobacco Use: High Risk (06/02/2023)    Readmission Risk Interventions     No data to display

## 2023-06-20 NOTE — ED Notes (Signed)
Pt had an accident on the floor. This tech helped her get cleaned up.

## 2023-06-20 NOTE — ED Provider Notes (Signed)
-----------------------------------------   7:37 AM on 06/20/2023 -----------------------------------------   Blood pressure (!) 131/100, pulse 87, temperature (!) 97.2 F (36.2 C), temperature source Axillary, resp. rate 18, height 1.702 m (5\' 7" ), weight 46 kg, SpO2 99 %.  The patient is calm and cooperative at this time.  There have been no acute events since the last update.  Awaiting disposition plan from [facility] team.   Loleta Rose, MD 06/20/23 [phone]

## 2023-06-20 NOTE — ED Notes (Signed)
Pt husband came to visit. Husband brought snacks.

## 2023-06-20 NOTE — ED Notes (Addendum)
Pt dinner tray arrived. Pt did not want any of the options given. Pt ate her fruit and drank her tea. Pt was offered applesauce but only took a few spoonfuls.

## 2023-06-20 NOTE — ED Notes (Signed)
Lunch tray delivered.

## 2023-06-21 ENCOUNTER — Emergency Department: Payer: Medicare HMO

## 2023-06-21 DIAGNOSIS — N390 Urinary tract infection, site not specified: Secondary | ICD-10-CM | POA: Diagnosis not present

## 2023-06-21 DIAGNOSIS — R918 Other nonspecific abnormal finding of lung field: Secondary | ICD-10-CM | POA: Diagnosis not present

## 2023-06-21 DIAGNOSIS — E041 Nontoxic single thyroid nodule: Secondary | ICD-10-CM | POA: Diagnosis not present

## 2023-06-21 NOTE — ED Notes (Signed)
Pt sitting up in bed, pt feeding herself, pt eating chicken fingers and fries.

## 2023-06-21 NOTE — ED Notes (Signed)
Pt will not let this tech obtain vital signs.

## 2023-06-21 NOTE — TOC Progression Note (Addendum)
Transition of Care [facility]) - Progression Note    Patient Details  Name: Nataline Dueitt MRN: [id number] Date of Birth: 1956/10/24  [facility]) CM/SW Contact  Darleene Cleaver, [place] Phone Number: 06/21/2023, 11:29 AM  Clinical Narrative:     CSW spoke to Ron from [facility] memory care.  They can accept patient pending chest x-ray tentatively tomorrow.  CSW notified Leigh Aurora from [facility] DSS guardianship and they she will have to contact admissions worker Ron at [facility] to finalize admission plan.  TOC to continue to follow patient's progress throughout discharge planning.  1:15pm CSW received phone call from [employer] DSS legal guardian, [phone].  They have spoken to [facility], and memory care ALF can accept patient tomorrow.    Per physician, Dr. Rosanne Ashing. Chales Abrahams, at 11:20am on 06/21/2023 No X-ray evidence of active TB.  CSW to email X-ray results to [facility] and DSS legal guardian.  DSS is requesting EMS transport to [facility] around 12pm, the address is [address] [place], [place] [address], phone number is [phone].    4:19pm CSW contacted [facility] [phone] to confirm they can transport patient tomorrow Wednesday 06/22/2023 around 12pm.  Per EMS they can transport patient tomorrow.  CSW requested that the ED secretary confirm in the morning that patient is still on the schedule for EMS transport.  Nurse to call report tomorrow at [phone] prior to discharge.       Expected Discharge Plan: Memory Care Barriers to Discharge: Continued Medical Work up  Expected Discharge Plan and Services   Discharge Planning Services: CM Consult   Living arrangements for the past 2 months: Single Family Home                 DME Arranged: N/A DME Agency: NA                   Social Determinants of Health (SDOH) Interventions SDOH Screenings   Tobacco Use: High Risk (06/02/2023)    Readmission Risk  Interventions     No data to display

## 2023-06-21 NOTE — ED Notes (Signed)
Pt combative and will not allow vital signs. Skin pwd, resps unlabored, rate of 18.

## 2023-06-21 NOTE — ED Provider Notes (Signed)
-----------------------------------------   8:10 AM on 06/21/2023 -----------------------------------------   Blood pressure (!) 131/100, pulse 87, temperature (!) 95.6 F (35.3 C), temperature source Axillary, resp. rate 17, height 5\' 7"  (1.702 m), weight 46 kg, SpO2 99 %.  The patient is calm and cooperative at this time.  There have been no acute events since the last update.  Awaiting disposition plan from [facility] team.    Dionne Bucy, MD 06/21/23 [phone]

## 2023-06-21 NOTE — ED Notes (Signed)
Sitter at bedside.

## 2023-06-21 NOTE — ED Notes (Signed)
Pt in bed, pt arouses easily to verbal stim, pt refused vital signs at this time, pt oriented to self.    Staff from [facility] came to evaluate pt and states that she can be admitted there, states that there is some paperwork to fill out and husband needs to sign some papers.

## 2023-06-21 NOTE — ED Notes (Signed)
Pt naked in room, directed pt back into bed, pt cooperative and re directable

## 2023-06-21 NOTE — ED Notes (Signed)
Report from lisa, rn 

## 2023-06-21 NOTE — ED Notes (Signed)
Sw notified of pt's tentative acceptance.

## 2023-06-22 DIAGNOSIS — N390 Urinary tract infection, site not specified: Secondary | ICD-10-CM | POA: Diagnosis not present

## 2023-06-22 DIAGNOSIS — Z743 Need for continuous supervision: Secondary | ICD-10-CM | POA: Diagnosis not present

## 2023-06-22 DIAGNOSIS — E041 Nontoxic single thyroid nodule: Secondary | ICD-10-CM | POA: Diagnosis not present

## 2023-06-22 DIAGNOSIS — R456 Violent behavior: Secondary | ICD-10-CM | POA: Diagnosis not present

## 2023-06-22 MED ORDER — OLANZAPINE 5 MG PO TABS: 5.0000 mg | ORAL_TABLET | Freq: Two times a day (BID) | ORAL | 11 refills | Status: AC

## 2023-06-22 MED ORDER — OLANZAPINE 2.5 MG PO TABS: 2.5000 mg | ORAL_TABLET | Freq: Every day | ORAL | 0 refills | Status: AC | PRN

## 2023-06-22 MED ORDER — LORAZEPAM 1 MG PO TABS: 1.0000 mg | ORAL_TABLET | Freq: Four times a day (QID) | ORAL | 0 refills | Status: AC | PRN

## 2023-06-22 MED ORDER — MELATONIN 3 MG PO TABS: 3.0000 mg | ORAL_TABLET | Freq: Every day | ORAL | 0 refills | Status: AC

## 2023-06-22 MED ORDER — HALOPERIDOL LACTATE 5 MG/ML IJ SOLN
5.0000 mg | Freq: Once | INTRAMUSCULAR | Status: AC
  Administered 2023-06-22: 5 mg via INTRAMUSCULAR
  Filled 2023-06-22: qty 1

## 2023-06-22 NOTE — ED Provider Notes (Signed)
This is a 67 year old with prolonged ER stay who was awaiting disposition from social work team. Notified by social work team that patient does have a safe disposition plan to an assisted living facility today.  Written prescriptions were provided for her prescription medication.  Patient was discharged in stable condition.   Trinna Post, MD 06/22/23 [phone]

## 2023-06-22 NOTE — ED Notes (Signed)
Patient refuses to have any vital signs completed.

## 2023-06-22 NOTE — ED Notes (Signed)
Pt awake, moving independently intermittently in bed. Sitter at bedside.

## 2023-06-22 NOTE — ED Notes (Signed)
EDT myself and faith gave pt a bath. Pt currently in bed looking at the ceiling edt at bedside.

## 2023-06-22 NOTE — TOC Transition Note (Signed)
Transition of Care [facility]) - CM/SW Discharge Note   Patient Details  Name: Janet Murphy MRN: [id number] Date of Birth: [birth year]  [facility]) CM/SW Contact:  Darleene Cleaver, LCSW Phone Number: 06/22/2023, 11:52 AM   Clinical Narrative:     Patient to be d/c'ed today to [facility].  Patient and family agreeable to plans will transport via ems RN to call report to [phone].  [facility] DSS is the legal guardian, signed adult care home [facility] emailed along with AVS to Ginger at [facility], and DSS legal guardian Janet Murphy, and Sharlyne Cai.  CSW notified attending physician that ALF would like prescriptions sent to [facility], [address] [place], [place] [address], [phone]. TOC signing off.     Final next level of care: Memory Care Barriers to Discharge: Barriers Resolved   Patient Goals and CMS Choice CMS Medicare.gov Compare Post Acute Care list provided to:: Legal Guardian Choice offered to / list presented to : [facility] POA / Guardian  Discharge Placement                Patient chooses bed at: Other - please specify in the comment section below: [facility]) Patient to be transferred to facility by: [facility] Name of family member notified: Janet Murphy Legal Guardian Patient and family notified of of transfer: 06/22/23  Discharge Plan and Services Additional resources added to the After Visit Summary for     Discharge Planning Services: CM Consult            DME Arranged: N/A DME Agency: NA                  Social Determinants of Health (SDOH) Interventions SDOH Screenings   Tobacco Use: High Risk (06/02/2023)     Readmission Risk Interventions     No data to display

## 2023-06-22 NOTE — ED Provider Notes (Signed)
-----------------------------------------   5:41 AM on 06/22/2023 -----------------------------------------   Blood pressure (!) 131/100, pulse 87, temperature (!) 95.6 F (35.3 C), temperature source Axillary, resp. rate 17, height 5\' 7"  (1.702 m), weight 46 kg, SpO2 99 %.  The patient is calm and cooperative at this time.  There have been no acute events since the last update.  Awaiting disposition plan from Social Work team.   Irean Hong, MD 06/22/23 [phone]

## 2023-06-22 NOTE — ED Notes (Signed)
Pt sleeping. Will allow pt to rest. Respirations unlabored. Will attempt vital signs again when pt awake. Will have to complete head to toe assessment when pt awake.

## 2023-06-22 NOTE — ED Notes (Signed)
Patient was sleeping soundly when this nurse entered the room.  Respirations were even and unlabored with no distress noted.

## 2023-06-22 NOTE — ED Notes (Signed)
Breakfast arrived pt only ate a few bites of bacon. Pt drank the ensure and grape juice.

## 2023-06-28 DIAGNOSIS — R32 Unspecified urinary incontinence: Secondary | ICD-10-CM | POA: Diagnosis not present

## 2023-06-28 DIAGNOSIS — J302 Other seasonal allergic rhinitis: Secondary | ICD-10-CM | POA: Diagnosis not present

## 2023-06-28 DIAGNOSIS — G4701 Insomnia due to medical condition: Secondary | ICD-10-CM | POA: Diagnosis not present

## 2023-07-04 DIAGNOSIS — R32 Unspecified urinary incontinence: Secondary | ICD-10-CM | POA: Diagnosis not present

## 2023-07-04 DIAGNOSIS — Z79899 Other long term (current) drug therapy: Secondary | ICD-10-CM | POA: Diagnosis not present

## 2023-07-04 DIAGNOSIS — N390 Urinary tract infection, site not specified: Secondary | ICD-10-CM | POA: Diagnosis not present

## 2023-07-05 DIAGNOSIS — R32 Unspecified urinary incontinence: Secondary | ICD-10-CM | POA: Diagnosis not present

## 2023-07-05 DIAGNOSIS — G4701 Insomnia due to medical condition: Secondary | ICD-10-CM | POA: Diagnosis not present

## 2023-07-14 DIAGNOSIS — L89301 Pressure ulcer of unspecified buttock, stage 1: Secondary | ICD-10-CM | POA: Diagnosis not present

## 2023-07-14 DIAGNOSIS — F03918 Unspecified dementia, unspecified severity, with other behavioral disturbance: Secondary | ICD-10-CM | POA: Diagnosis not present

## 2023-07-14 DIAGNOSIS — B372 Candidiasis of skin and nail: Secondary | ICD-10-CM | POA: Diagnosis not present

## 2023-07-18 DIAGNOSIS — F411 Generalized anxiety disorder: Secondary | ICD-10-CM | POA: Diagnosis not present

## 2023-07-18 DIAGNOSIS — F01C18 Vascular dementia, severe, with other behavioral disturbance: Secondary | ICD-10-CM | POA: Diagnosis not present

## 2023-07-18 DIAGNOSIS — I10 Essential (primary) hypertension: Secondary | ICD-10-CM | POA: Diagnosis not present

## 2023-07-26 DIAGNOSIS — R451 Restlessness and agitation: Secondary | ICD-10-CM | POA: Diagnosis not present

## 2023-07-28 DIAGNOSIS — F329 Major depressive disorder, single episode, unspecified: Secondary | ICD-10-CM | POA: Diagnosis not present

## 2023-07-28 DIAGNOSIS — R451 Restlessness and agitation: Secondary | ICD-10-CM | POA: Diagnosis not present

## 2023-08-01 DIAGNOSIS — F03918 Unspecified dementia, unspecified severity, with other behavioral disturbance: Secondary | ICD-10-CM | POA: Diagnosis not present

## 2023-08-01 DIAGNOSIS — N390 Urinary tract infection, site not specified: Secondary | ICD-10-CM | POA: Diagnosis not present

## 2023-08-09 DIAGNOSIS — F329 Major depressive disorder, single episode, unspecified: Secondary | ICD-10-CM | POA: Diagnosis not present

## 2023-08-09 DIAGNOSIS — R6884 Jaw pain: Secondary | ICD-10-CM | POA: Diagnosis not present

## 2023-08-09 DIAGNOSIS — K0889 Other specified disorders of teeth and supporting structures: Secondary | ICD-10-CM | POA: Diagnosis not present

## 2023-08-17 DIAGNOSIS — I10 Essential (primary) hypertension: Secondary | ICD-10-CM | POA: Diagnosis not present

## 2023-08-17 DIAGNOSIS — F411 Generalized anxiety disorder: Secondary | ICD-10-CM | POA: Diagnosis not present

## 2023-08-17 DIAGNOSIS — F01C18 Vascular dementia, severe, with other behavioral disturbance: Secondary | ICD-10-CM | POA: Diagnosis not present

## 2023-08-23 DIAGNOSIS — J302 Other seasonal allergic rhinitis: Secondary | ICD-10-CM | POA: Diagnosis not present

## 2023-09-01 DIAGNOSIS — R634 Abnormal weight loss: Secondary | ICD-10-CM | POA: Diagnosis not present

## 2023-09-01 DIAGNOSIS — R627 Adult failure to thrive: Secondary | ICD-10-CM | POA: Diagnosis not present

## 2023-09-01 DIAGNOSIS — R32 Unspecified urinary incontinence: Secondary | ICD-10-CM | POA: Diagnosis not present

## 2023-09-01 DIAGNOSIS — M6281 Muscle weakness (generalized): Secondary | ICD-10-CM | POA: Diagnosis not present

## 2023-09-06 DIAGNOSIS — R634 Abnormal weight loss: Secondary | ICD-10-CM | POA: Diagnosis not present

## 2023-09-06 DIAGNOSIS — R627 Adult failure to thrive: Secondary | ICD-10-CM | POA: Diagnosis not present

## 2023-09-06 DIAGNOSIS — M6281 Muscle weakness (generalized): Secondary | ICD-10-CM | POA: Diagnosis not present

## 2023-09-13 DIAGNOSIS — F411 Generalized anxiety disorder: Secondary | ICD-10-CM | POA: Diagnosis not present

## 2023-09-13 DIAGNOSIS — I10 Essential (primary) hypertension: Secondary | ICD-10-CM | POA: Diagnosis not present

## 2023-09-13 DIAGNOSIS — F01C18 Vascular dementia, severe, with other behavioral disturbance: Secondary | ICD-10-CM | POA: Diagnosis not present

## 2023-09-19 DIAGNOSIS — Z9181 History of falling: Secondary | ICD-10-CM | POA: Diagnosis not present

## 2023-09-19 DIAGNOSIS — G4701 Insomnia due to medical condition: Secondary | ICD-10-CM | POA: Diagnosis not present

## 2023-09-19 DIAGNOSIS — F0393 Unspecified dementia, unspecified severity, with mood disturbance: Secondary | ICD-10-CM | POA: Diagnosis not present

## 2023-09-19 DIAGNOSIS — R2689 Other abnormalities of gait and mobility: Secondary | ICD-10-CM | POA: Diagnosis not present

## 2023-09-19 DIAGNOSIS — Z87440 Personal history of urinary (tract) infections: Secondary | ICD-10-CM | POA: Diagnosis not present

## 2023-09-19 DIAGNOSIS — R451 Restlessness and agitation: Secondary | ICD-10-CM | POA: Diagnosis not present

## 2023-09-19 DIAGNOSIS — F329 Major depressive disorder, single episode, unspecified: Secondary | ICD-10-CM | POA: Diagnosis not present

## 2023-09-19 DIAGNOSIS — M6281 Muscle weakness (generalized): Secondary | ICD-10-CM | POA: Diagnosis not present

## 2023-09-19 DIAGNOSIS — Z9183 Wandering in diseases classified elsewhere: Secondary | ICD-10-CM | POA: Diagnosis not present

## 2023-09-19 DIAGNOSIS — R32 Unspecified urinary incontinence: Secondary | ICD-10-CM | POA: Diagnosis not present

## 2023-09-19 DIAGNOSIS — F03911 Unspecified dementia, unspecified severity, with agitation: Secondary | ICD-10-CM | POA: Diagnosis not present

## 2023-09-19 DIAGNOSIS — F03918 Unspecified dementia, unspecified severity, with other behavioral disturbance: Secondary | ICD-10-CM | POA: Diagnosis not present

## 2023-09-20 DIAGNOSIS — Z9183 Wandering in diseases classified elsewhere: Secondary | ICD-10-CM | POA: Diagnosis not present

## 2023-09-20 DIAGNOSIS — F329 Major depressive disorder, single episode, unspecified: Secondary | ICD-10-CM | POA: Diagnosis not present

## 2023-09-20 DIAGNOSIS — K089 Disorder of teeth and supporting structures, unspecified: Secondary | ICD-10-CM | POA: Diagnosis not present

## 2023-09-20 DIAGNOSIS — F03911 Unspecified dementia, unspecified severity, with agitation: Secondary | ICD-10-CM | POA: Diagnosis not present

## 2023-09-20 DIAGNOSIS — Z87440 Personal history of urinary (tract) infections: Secondary | ICD-10-CM | POA: Diagnosis not present

## 2023-09-20 DIAGNOSIS — R32 Unspecified urinary incontinence: Secondary | ICD-10-CM | POA: Diagnosis not present

## 2023-09-20 DIAGNOSIS — F03918 Unspecified dementia, unspecified severity, with other behavioral disturbance: Secondary | ICD-10-CM | POA: Diagnosis not present

## 2023-09-20 DIAGNOSIS — G4701 Insomnia due to medical condition: Secondary | ICD-10-CM | POA: Diagnosis not present

## 2023-09-20 DIAGNOSIS — Z9181 History of falling: Secondary | ICD-10-CM | POA: Diagnosis not present

## 2023-09-20 DIAGNOSIS — J302 Other seasonal allergic rhinitis: Secondary | ICD-10-CM | POA: Diagnosis not present

## 2023-09-20 DIAGNOSIS — R451 Restlessness and agitation: Secondary | ICD-10-CM | POA: Diagnosis not present

## 2023-09-20 DIAGNOSIS — F0393 Unspecified dementia, unspecified severity, with mood disturbance: Secondary | ICD-10-CM | POA: Diagnosis not present

## 2023-09-29 DIAGNOSIS — M6281 Muscle weakness (generalized): Secondary | ICD-10-CM | POA: Diagnosis not present

## 2023-09-29 DIAGNOSIS — F329 Major depressive disorder, single episode, unspecified: Secondary | ICD-10-CM | POA: Diagnosis not present

## 2023-09-29 DIAGNOSIS — Z79899 Other long term (current) drug therapy: Secondary | ICD-10-CM | POA: Diagnosis not present

## 2023-09-29 DIAGNOSIS — Z131 Encounter for screening for diabetes mellitus: Secondary | ICD-10-CM | POA: Diagnosis not present

## 2023-09-30 DIAGNOSIS — F329 Major depressive disorder, single episode, unspecified: Secondary | ICD-10-CM | POA: Diagnosis not present

## 2023-09-30 DIAGNOSIS — F0393 Unspecified dementia, unspecified severity, with mood disturbance: Secondary | ICD-10-CM | POA: Diagnosis not present

## 2023-09-30 DIAGNOSIS — R32 Unspecified urinary incontinence: Secondary | ICD-10-CM | POA: Diagnosis not present

## 2023-09-30 DIAGNOSIS — Z9181 History of falling: Secondary | ICD-10-CM | POA: Diagnosis not present

## 2023-09-30 DIAGNOSIS — G4701 Insomnia due to medical condition: Secondary | ICD-10-CM | POA: Diagnosis not present

## 2023-09-30 DIAGNOSIS — F03911 Unspecified dementia, unspecified severity, with agitation: Secondary | ICD-10-CM | POA: Diagnosis not present

## 2023-09-30 DIAGNOSIS — Z87440 Personal history of urinary (tract) infections: Secondary | ICD-10-CM | POA: Diagnosis not present

## 2023-09-30 DIAGNOSIS — F03918 Unspecified dementia, unspecified severity, with other behavioral disturbance: Secondary | ICD-10-CM | POA: Diagnosis not present

## 2023-09-30 DIAGNOSIS — Z9183 Wandering in diseases classified elsewhere: Secondary | ICD-10-CM | POA: Diagnosis not present

## 2023-10-03 DIAGNOSIS — F03911 Unspecified dementia, unspecified severity, with agitation: Secondary | ICD-10-CM | POA: Diagnosis not present

## 2023-10-03 DIAGNOSIS — Z87440 Personal history of urinary (tract) infections: Secondary | ICD-10-CM | POA: Diagnosis not present

## 2023-10-03 DIAGNOSIS — Z9181 History of falling: Secondary | ICD-10-CM | POA: Diagnosis not present

## 2023-10-03 DIAGNOSIS — Z9183 Wandering in diseases classified elsewhere: Secondary | ICD-10-CM | POA: Diagnosis not present

## 2023-10-03 DIAGNOSIS — F329 Major depressive disorder, single episode, unspecified: Secondary | ICD-10-CM | POA: Diagnosis not present

## 2023-10-03 DIAGNOSIS — F0393 Unspecified dementia, unspecified severity, with mood disturbance: Secondary | ICD-10-CM | POA: Diagnosis not present

## 2023-10-03 DIAGNOSIS — F03918 Unspecified dementia, unspecified severity, with other behavioral disturbance: Secondary | ICD-10-CM | POA: Diagnosis not present

## 2023-10-03 DIAGNOSIS — G4701 Insomnia due to medical condition: Secondary | ICD-10-CM | POA: Diagnosis not present

## 2023-10-03 DIAGNOSIS — R32 Unspecified urinary incontinence: Secondary | ICD-10-CM | POA: Diagnosis not present

## 2023-10-04 DIAGNOSIS — R627 Adult failure to thrive: Secondary | ICD-10-CM | POA: Diagnosis not present

## 2023-10-04 DIAGNOSIS — R1310 Dysphagia, unspecified: Secondary | ICD-10-CM | POA: Diagnosis not present

## 2023-10-04 DIAGNOSIS — M6281 Muscle weakness (generalized): Secondary | ICD-10-CM | POA: Diagnosis not present

## 2023-10-04 DIAGNOSIS — R634 Abnormal weight loss: Secondary | ICD-10-CM | POA: Diagnosis not present

## 2023-10-11 DIAGNOSIS — F03918 Unspecified dementia, unspecified severity, with other behavioral disturbance: Secondary | ICD-10-CM | POA: Diagnosis not present

## 2023-10-11 DIAGNOSIS — E785 Hyperlipidemia, unspecified: Secondary | ICD-10-CM | POA: Diagnosis not present

## 2023-10-11 DIAGNOSIS — M6281 Muscle weakness (generalized): Secondary | ICD-10-CM | POA: Diagnosis not present

## 2023-10-14 DIAGNOSIS — F02811 Dementia in other diseases classified elsewhere, unspecified severity, with agitation: Secondary | ICD-10-CM | POA: Diagnosis not present

## 2023-10-14 DIAGNOSIS — R4182 Altered mental status, unspecified: Secondary | ICD-10-CM | POA: Diagnosis not present

## 2023-10-14 DIAGNOSIS — F03C11 Unspecified dementia, severe, with agitation: Secondary | ICD-10-CM | POA: Diagnosis not present

## 2023-10-14 DIAGNOSIS — Z681 Body mass index (BMI) 19 or less, adult: Secondary | ICD-10-CM | POA: Diagnosis not present

## 2023-10-14 DIAGNOSIS — E440 Moderate protein-calorie malnutrition: Secondary | ICD-10-CM | POA: Diagnosis not present

## 2023-10-14 DIAGNOSIS — N390 Urinary tract infection, site not specified: Secondary | ICD-10-CM | POA: Diagnosis not present

## 2023-10-14 DIAGNOSIS — R079 Chest pain, unspecified: Secondary | ICD-10-CM | POA: Diagnosis not present

## 2023-10-14 DIAGNOSIS — M2578 Osteophyte, vertebrae: Secondary | ICD-10-CM | POA: Diagnosis not present

## 2023-10-14 DIAGNOSIS — Z8659 Personal history of other mental and behavioral disorders: Secondary | ICD-10-CM | POA: Diagnosis not present

## 2023-10-14 DIAGNOSIS — Z792 Long term (current) use of antibiotics: Secondary | ICD-10-CM | POA: Diagnosis not present

## 2023-10-14 DIAGNOSIS — F419 Anxiety disorder, unspecified: Secondary | ICD-10-CM | POA: Diagnosis not present

## 2023-10-14 DIAGNOSIS — Z743 Need for continuous supervision: Secondary | ICD-10-CM | POA: Diagnosis not present

## 2023-10-14 DIAGNOSIS — R55 Syncope and collapse: Secondary | ICD-10-CM | POA: Diagnosis not present

## 2023-10-14 DIAGNOSIS — R9082 White matter disease, unspecified: Secondary | ICD-10-CM | POA: Diagnosis not present

## 2023-10-14 DIAGNOSIS — F329 Major depressive disorder, single episode, unspecified: Secondary | ICD-10-CM | POA: Diagnosis not present

## 2023-10-14 DIAGNOSIS — F0390 Unspecified dementia without behavioral disturbance: Secondary | ICD-10-CM | POA: Diagnosis not present

## 2023-10-14 DIAGNOSIS — M1990 Unspecified osteoarthritis, unspecified site: Secondary | ICD-10-CM | POA: Diagnosis not present

## 2023-10-14 DIAGNOSIS — E860 Dehydration: Secondary | ICD-10-CM | POA: Diagnosis not present

## 2023-10-14 DIAGNOSIS — Z1152 Encounter for screening for COVID-19: Secondary | ICD-10-CM | POA: Diagnosis not present

## 2023-10-14 DIAGNOSIS — R410 Disorientation, unspecified: Secondary | ICD-10-CM | POA: Diagnosis not present

## 2023-10-14 DIAGNOSIS — Z79899 Other long term (current) drug therapy: Secondary | ICD-10-CM | POA: Diagnosis not present

## 2023-10-14 DIAGNOSIS — R569 Unspecified convulsions: Secondary | ICD-10-CM | POA: Diagnosis not present

## 2023-10-14 DIAGNOSIS — S2241XA Multiple fractures of ribs, right side, initial encounter for closed fracture: Secondary | ICD-10-CM | POA: Diagnosis not present

## 2023-10-17 DIAGNOSIS — R55 Syncope and collapse: Secondary | ICD-10-CM | POA: Diagnosis not present

## 2023-10-20 DIAGNOSIS — R55 Syncope and collapse: Secondary | ICD-10-CM | POA: Diagnosis not present

## 2023-10-20 DIAGNOSIS — R1310 Dysphagia, unspecified: Secondary | ICD-10-CM | POA: Diagnosis not present

## 2023-11-14 DIAGNOSIS — F01C18 Vascular dementia, severe, with other behavioral disturbance: Secondary | ICD-10-CM | POA: Diagnosis not present

## 2023-11-14 DIAGNOSIS — I10 Essential (primary) hypertension: Secondary | ICD-10-CM | POA: Diagnosis not present

## 2023-11-14 DIAGNOSIS — F411 Generalized anxiety disorder: Secondary | ICD-10-CM | POA: Diagnosis not present

## 2023-11-24 DIAGNOSIS — R32 Unspecified urinary incontinence: Secondary | ICD-10-CM | POA: Diagnosis not present

## 2023-11-29 DIAGNOSIS — F03918 Unspecified dementia, unspecified severity, with other behavioral disturbance: Secondary | ICD-10-CM | POA: Diagnosis not present

## 2023-11-29 DIAGNOSIS — E785 Hyperlipidemia, unspecified: Secondary | ICD-10-CM | POA: Diagnosis not present

## 2023-11-29 DIAGNOSIS — R451 Restlessness and agitation: Secondary | ICD-10-CM | POA: Diagnosis not present

## 2023-11-29 DIAGNOSIS — M6281 Muscle weakness (generalized): Secondary | ICD-10-CM | POA: Diagnosis not present

## 2023-12-27 DIAGNOSIS — J302 Other seasonal allergic rhinitis: Secondary | ICD-10-CM | POA: Diagnosis not present

## 2023-12-27 DIAGNOSIS — G4701 Insomnia due to medical condition: Secondary | ICD-10-CM | POA: Diagnosis not present

## 2023-12-27 DIAGNOSIS — R451 Restlessness and agitation: Secondary | ICD-10-CM | POA: Diagnosis not present

## 2023-12-27 DIAGNOSIS — R32 Unspecified urinary incontinence: Secondary | ICD-10-CM | POA: Diagnosis not present

## 2024-01-31 DIAGNOSIS — F411 Generalized anxiety disorder: Secondary | ICD-10-CM | POA: Diagnosis not present

## 2024-01-31 DIAGNOSIS — F03918 Unspecified dementia, unspecified severity, with other behavioral disturbance: Secondary | ICD-10-CM | POA: Diagnosis not present

## 2024-01-31 DIAGNOSIS — I10 Essential (primary) hypertension: Secondary | ICD-10-CM | POA: Diagnosis not present

## 2024-01-31 DIAGNOSIS — K089 Disorder of teeth and supporting structures, unspecified: Secondary | ICD-10-CM | POA: Diagnosis not present

## 2024-01-31 DIAGNOSIS — F01C18 Vascular dementia, severe, with other behavioral disturbance: Secondary | ICD-10-CM | POA: Diagnosis not present
